# Patient Record
Sex: Male | Born: 1955 | Race: White | Hispanic: No | Smoking: Former smoker
Health system: Southern US, Community
[De-identification: ages and names within clinical notes are randomized; demographics above are authoritative.]

## PROBLEM LIST (undated history)

## (undated) DIAGNOSIS — J302 Other seasonal allergic rhinitis: Secondary | ICD-10-CM

## (undated) DIAGNOSIS — Z720 Tobacco use: Secondary | ICD-10-CM

## (undated) DIAGNOSIS — Z5189 Encounter for other specified aftercare: Secondary | ICD-10-CM

## (undated) DIAGNOSIS — C159 Malignant neoplasm of esophagus, unspecified: Secondary | ICD-10-CM

## (undated) DIAGNOSIS — Z9221 Personal history of antineoplastic chemotherapy: Secondary | ICD-10-CM

## (undated) DIAGNOSIS — Z923 Personal history of irradiation: Secondary | ICD-10-CM

## (undated) DIAGNOSIS — D72829 Elevated white blood cell count, unspecified: Secondary | ICD-10-CM

## (undated) DIAGNOSIS — K08109 Complete loss of teeth, unspecified cause, unspecified class: Secondary | ICD-10-CM

## (undated) DIAGNOSIS — K000 Anodontia: Secondary | ICD-10-CM

## (undated) DIAGNOSIS — C029 Malignant neoplasm of tongue, unspecified: Principal | ICD-10-CM

## (undated) HISTORY — DX: Other seasonal allergic rhinitis: J30.2

## (undated) HISTORY — PX: OTHER SURGICAL HISTORY: SHX169

## (undated) HISTORY — PX: TRACHEAL CLOSED STENT REMOVAL W/ MLB: SHX2550

---

## 2011-10-30 ENCOUNTER — Inpatient Hospital Stay (HOSPITAL_COMMUNITY)
Admission: EM | Admit: 2011-10-30 | Discharge: 2011-11-01 | DRG: 148 | Disposition: A | Discharge status: Home / Self Care | Payer: Medicaid Other | Attending: Internal Medicine | Admitting: Internal Medicine

## 2011-10-30 ENCOUNTER — Emergency Department (HOSPITAL_COMMUNITY): Payer: Medicaid Other

## 2011-10-30 ENCOUNTER — Encounter (HOSPITAL_COMMUNITY): Payer: Self-pay | Admitting: *Deleted

## 2011-10-30 ENCOUNTER — Emergency Department (HOSPITAL_COMMUNITY)
Admission: EM | Admit: 2011-10-30 | Discharge: 2011-10-30 | Disposition: A | Discharge status: Home / Self Care | Payer: Self-pay | Source: Home / Self Care

## 2011-10-30 DIAGNOSIS — E871 Hypo-osmolality and hyponatremia: Secondary | ICD-10-CM | POA: Diagnosis present

## 2011-10-30 DIAGNOSIS — R634 Abnormal weight loss: Secondary | ICD-10-CM

## 2011-10-30 DIAGNOSIS — R599 Enlarged lymph nodes, unspecified: Secondary | ICD-10-CM | POA: Diagnosis present

## 2011-10-30 DIAGNOSIS — K148 Other diseases of tongue: Secondary | ICD-10-CM

## 2011-10-30 DIAGNOSIS — C029 Malignant neoplasm of tongue, unspecified: Secondary | ICD-10-CM

## 2011-10-30 DIAGNOSIS — E860 Dehydration: Secondary | ICD-10-CM | POA: Diagnosis present

## 2011-10-30 DIAGNOSIS — D72829 Elevated white blood cell count, unspecified: Secondary | ICD-10-CM | POA: Diagnosis present

## 2011-10-30 DIAGNOSIS — R1311 Dysphagia, oral phase: Secondary | ICD-10-CM | POA: Diagnosis present

## 2011-10-30 DIAGNOSIS — F17200 Nicotine dependence, unspecified, uncomplicated: Secondary | ICD-10-CM | POA: Diagnosis present

## 2011-10-30 DIAGNOSIS — C023 Malignant neoplasm of anterior two-thirds of tongue, part unspecified: Principal | ICD-10-CM | POA: Diagnosis present

## 2011-10-30 HISTORY — DX: Tobacco use: Z72.0

## 2011-10-30 LAB — DIFFERENTIAL
Eosinophils Absolute: 0.2 10*3/uL (ref 0.0–0.7)
Eosinophils Relative: 1 % (ref 0–5)
Lymphs Abs: 2.8 10*3/uL (ref 0.7–4.0)
Monocytes Relative: 7 % (ref 3–12)
Neutrophils Relative %: 72 % (ref 43–77)

## 2011-10-30 LAB — HEPATIC FUNCTION PANEL
ALT: 7 U/L (ref 0–53)
AST: 17 U/L (ref 0–37)
Albumin: 3.4 g/dL — ABNORMAL LOW (ref 3.5–5.2)
Alkaline Phosphatase: 69 U/L (ref 39–117)
Bilirubin, Direct: <0.1 mg/dL (ref 0.0–0.3)
Total Bilirubin: 0.4 mg/dL (ref 0.3–1.2)
Total Protein: 7.7 g/dL (ref 6.0–8.3)

## 2011-10-30 LAB — BASIC METABOLIC PANEL
CO2: 21 mEq/L (ref 19–32)
Chloride: 95 mEq/L — ABNORMAL LOW (ref 96–112)
Glucose, Bld: 100 mg/dL — ABNORMAL HIGH (ref 70–99)
Potassium: 4.1 mEq/L (ref 3.5–5.1)
Sodium: 127 mEq/L — ABNORMAL LOW (ref 135–145)

## 2011-10-30 LAB — CBC
HCT: 47.3 % (ref 39.0–52.0)
Hemoglobin: 15.9 g/dL (ref 13.0–17.0)
MCH: 29.6 pg (ref 26.0–34.0)
MCV: 88.1 fL (ref 78.0–100.0)
RBC: 5.37 MIL/uL (ref 4.22–5.81)

## 2011-10-30 MED ORDER — IOHEXOL 300 MG/ML  SOLN
75.0000 mL | Freq: Once | INTRAMUSCULAR | Status: AC | PRN
  Administered 2011-10-30: 75 mL via INTRAVENOUS

## 2011-10-30 MED ORDER — SODIUM CHLORIDE 0.9 % IV BOLUS (SEPSIS)
1000.0000 mL | Freq: Once | INTRAVENOUS | Status: AC
  Administered 2011-10-30: 1000 mL via INTRAVENOUS

## 2011-10-30 MED ORDER — CLINDAMYCIN PHOSPHATE 900 MG/50ML IV SOLN
900.0000 mg | Freq: Once | INTRAVENOUS | Status: AC
  Administered 2011-10-30: 900 mg via INTRAVENOUS
  Filled 2011-10-30 (×2): qty 50

## 2011-10-30 NOTE — ED Notes (Signed)
Pt stated he could not void.

## 2011-10-30 NOTE — ED Notes (Signed)
Assumed pt care.

## 2011-10-30 NOTE — ED Notes (Signed)
Church members and family at the bedside for prayer.  Awaiting Orthopedic surgeon.

## 2011-10-30 NOTE — ED Notes (Signed)
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
## 2011-10-30 NOTE — ED Notes (Signed)
Pt on monitor 

## 2011-10-30 NOTE — ED Notes (Signed)
Pt is transfer from urgent care. Pt with swelling and decay to tongue, reports was here several months ago after a broken tooth and over the last couple weeks has noticed an increase in swelling and decay. Airway intact. Pt reports weight loss.

## 2011-10-30 NOTE — ED Notes (Signed)
Pt resting quietly, watching TV.  No one at the [facility] at this time.  Pt denies oral pain at this time.  There is a malodorous scent in the room.  Pt's tongue has extreme erosion on the left side along with area that appear to be necrotic.  No bleeding noted.  There is what appears to be a small area of purulent drainage to the left and bottom side of his tongue.  His speech is extremely affected as a result of the deformity and erosion to his tongue. Pt reports that he had a tooth that had abcesed and he injured the tip of his tongue on the jagged edge some time ago.  He has not sought medical advice or  Treatment prior to today.

## 2011-10-31 ENCOUNTER — Encounter (HOSPITAL_COMMUNITY): Payer: Self-pay | Admitting: Internal Medicine

## 2011-10-31 DIAGNOSIS — E860 Dehydration: Secondary | ICD-10-CM | POA: Diagnosis present

## 2011-10-31 DIAGNOSIS — E871 Hypo-osmolality and hyponatremia: Secondary | ICD-10-CM | POA: Diagnosis present

## 2011-10-31 DIAGNOSIS — C029 Malignant neoplasm of tongue, unspecified: Secondary | ICD-10-CM | POA: Diagnosis present

## 2011-10-31 DIAGNOSIS — D72829 Elevated white blood cell count, unspecified: Secondary | ICD-10-CM | POA: Diagnosis present

## 2011-10-31 LAB — URINALYSIS, ROUTINE W REFLEX MICROSCOPIC
Glucose, UA: NEGATIVE mg/dL
Hgb urine dipstick: NEGATIVE
Protein, ur: NEGATIVE mg/dL

## 2011-10-31 LAB — MAGNESIUM: Magnesium: 1.8 mg/dL (ref 1.5–2.5)

## 2011-10-31 LAB — CBC
HCT: 45 % (ref 39.0–52.0)
Hemoglobin: 15.1 g/dL (ref 13.0–17.0)
MCH: 29.7 pg (ref 26.0–34.0)
MCHC: 33.6 g/dL (ref 30.0–36.0)

## 2011-10-31 LAB — COMPREHENSIVE METABOLIC PANEL
ALT: 5 U/L (ref 0–53)
AST: 13 U/L (ref 0–37)
CO2: 23 mEq/L (ref 19–32)
Calcium: 9.4 mg/dL (ref 8.4–10.5)
Sodium: 133 mEq/L — ABNORMAL LOW (ref 135–145)
Total Protein: 6.5 g/dL (ref 6.0–8.3)

## 2011-10-31 LAB — TSH: TSH: 2.271 u[IU]/mL (ref 0.350–4.500)

## 2011-10-31 MED ORDER — ONDANSETRON HCL 4 MG PO TABS: 4.0000 mg | ORAL_TABLET | Freq: Four times a day (QID) | ORAL | Status: DC | PRN

## 2011-10-31 MED ORDER — ENOXAPARIN SODIUM 40 MG/0.4ML ~~LOC~~ SOLN
40.0000 mg | SUBCUTANEOUS | Status: DC
  Administered 2011-10-31: 40 mg via SUBCUTANEOUS
  Filled 2011-10-31 (×2): qty 0.4

## 2011-10-31 MED ORDER — NICOTINE 14 MG/24HR TD PT24
14.0000 mg | MEDICATED_PATCH | Freq: Every day | TRANSDERMAL | Status: DC
  Administered 2011-10-31: 14 mg via TRANSDERMAL
  Filled 2011-10-31 (×2): qty 1

## 2011-10-31 MED ORDER — THIAMINE HCL 100 MG/ML IJ SOLN
Freq: Once | INTRAVENOUS | Status: AC
  Administered 2011-10-31: 06:00:00 via INTRAVENOUS
  Filled 2011-10-31 (×3): qty 1000

## 2011-10-31 MED ORDER — ACETAMINOPHEN 325 MG PO TABS: 650.0000 mg | ORAL_TABLET | Freq: Four times a day (QID) | ORAL | Status: DC | PRN

## 2011-10-31 MED ORDER — SODIUM CHLORIDE 0.9 % IV SOLN
Freq: Once | INTRAVENOUS | Status: AC
  Administered 2011-10-31: 01:00:00 via INTRAVENOUS

## 2011-10-31 MED ORDER — ONDANSETRON HCL 4 MG/2ML IJ SOLN: 4.0000 mg | Freq: Four times a day (QID) | INTRAMUSCULAR | Status: DC | PRN

## 2011-10-31 MED ORDER — SODIUM CHLORIDE 0.9 % IV SOLN
INTRAVENOUS | Status: AC
  Administered 2011-10-31: 03:00:00 via INTRAVENOUS

## 2011-10-31 MED ORDER — SODIUM CHLORIDE 0.9 % IV SOLN
INTRAVENOUS | Status: DC
  Administered 2011-10-31: 1000 mL via INTRAVENOUS

## 2011-10-31 MED ORDER — GUAIFENESIN-DM 100-10 MG/5ML PO SYRP
5.0000 mL | ORAL_SOLUTION | ORAL | Status: DC | PRN
  Filled 2011-10-31: qty 5

## 2011-10-31 MED ORDER — ALBUTEROL SULFATE (5 MG/ML) 0.5% IN NEBU: 2.5000 mg | INHALATION_SOLUTION | RESPIRATORY_TRACT | Status: DC | PRN

## 2011-10-31 MED ORDER — ENSURE IMMUNE HEALTH PO LIQD: 237.0000 mL | Freq: Three times a day (TID) | ORAL | Status: DC

## 2011-10-31 MED ORDER — HYDROCODONE-ACETAMINOPHEN 5-325 MG PO TABS: 1.0000 | ORAL_TABLET | ORAL | Status: DC | PRN

## 2011-10-31 MED ORDER — ACETAMINOPHEN 650 MG RE SUPP: 650.0000 mg | Freq: Four times a day (QID) | RECTAL | Status: DC | PRN

## 2011-10-31 MED ORDER — ALUM & MAG HYDROXIDE-SIMETH 200-200-20 MG/5ML PO SUSP: 30.0000 mL | Freq: Four times a day (QID) | ORAL | Status: DC | PRN

## 2011-10-31 NOTE — ED Notes (Signed)
Report given to [facility], pt to go to CDU 4

## 2011-10-31 NOTE — Progress Notes (Signed)
   CARE MANAGEMENT NOTE 10/31/2011  Patient:  Henry Leonard, Henry Leonard   Account Number:  [id number]  Date Initiated:  10/31/2011  Documentation initiated by:  Letha Cape  Subjective/Objective Assessment:   dx tongue cancer  admit- lives alone- rents a room out to a friend. pta independent.     Action/Plan:   Anticipated DC Date:  11/03/2011   Anticipated DC Plan:  HOME/SELF CARE      DC Planning Services  CM consult      Choice offered to / List presented to:             Status of service:  In process, will continue to follow Medicare Important Message given?   (If response is "NO", the following Medicare IM given date fields will be blank) Date Medicare IM given:   Date Additional Medicare IM given:    Discharge Disposition:    Per UR Regulation:    If discussed at Long Length of Stay Meetings, dates discussed:    Comments:  10/31/11 15:02 Letha Cape RN, BSN [phone] patient lives alone and he rents a room out to a friend. Patient states he is not on any medications really and if he is put on any meds at dc he can afford generics at [facility] for $4.  Patient states he has transportation at dc.

## 2011-10-31 NOTE — ED Notes (Addendum)
MD at bedside. Dr. Adela Glimpse

## 2011-10-31 NOTE — Progress Notes (Signed)
PATIENT DETAILS Name: Henry Leonard Age: 56 y.o. Sex: male Date of Birth: 26-Jan-1956 Admit Date: 10/30/2011 PCP:No primary provider on file.  Subjective: No major issues overnight  Objective: Vital signs in last 24 hours: Filed Vitals:   10/30/11 2215 10/31/11 0044 10/31/11 0301 10/31/11 0458  BP: 121/83 136/86 109/74 120/79  Pulse: 98 85 85 72  Temp:   98.4 F (36.9 C) 98.2 F (36.8 C)  TempSrc:   Oral Oral  Resp: 21 20 20 20   Height:   5\' 6"  (1.676 m)   Weight:   59.7 kg (131 lb 9.8 oz)   SpO2: 97% 93% 94% 97%    Weight change:   Body mass index is 21.24 kg/(m^2).  Intake/Output from previous day:  Intake/Output Summary (Last 24 hours) at 10/31/11 1052 Last data filed at 10/31/11 0458  Gross per 24 hour  Intake      0 ml  Output    400 ml  Net   -400 ml    PHYSICAL EXAM: Gen Exam: Awake and alert with clear speech. Mild trismus Neck: Supple, No JVD.  +Palpable cervical lymphadenopathy Chest: B/L Clear.   CVS: S1 S2 Regular, no murmurs.  Abdomen: soft, BS +, non tender, non distended.  Extremities: no edema, lower extremities warm to touch. Neurologic: Non Focal.   Skin: No Rash.   Wounds: N/A.    CONSULTS:  None  LAB RESULTS: CBC  Lab 10/31/11 0500 10/30/11 1726  WBC 11.9* 14.9*  HGB 15.1 15.9  HCT 45.0 47.3  PLT 390 437*  MCV 88.6 88.1  MCH 29.7 29.6  MCHC 33.6 33.6  RDW 14.9 15.0  LYMPHSABS -- 2.8  MONOABS -- 1.1*  EOSABS -- 0.2  BASOSABS -- 0.1  BANDABS -- --    Chemistries   Lab 10/31/11 0500 10/30/11 1726  NA 133* 127*  K 3.9 4.1  CL 101 95*  CO2 23 21  GLUCOSE 87 100*  BUN 5* 5*  CREATININE 0.48* 0.45*  CALCIUM 9.4 9.9  MG 1.8 --    GFR Estimated Creatinine Clearance: 90.2 ml/min (by C-G formula based on Cr of 0.48).  Coagulation profile No results found for this basename: INR:5,PROTIME:5 in the last 168 hours  Cardiac Enzymes No results found for this basename: CK:3,CKMB:3,TROPONINI:3,MYOGLOBIN:3 in the last 168  hours  No components found with this basename: POCBNP:3 No results found for this basename: DDIMER:2 in the last 72 hours No results found for this basename: HGBA1C:2 in the last 72 hours No results found for this basename: CHOL:2,HDL:2,LDLCALC:2,TRIG:2,CHOLHDL:2,LDLDIRECT:2 in the last 72 hours  Basename 10/31/11 0500  TSH 2.271  T4TOTAL --  T3FREE --  THYROIDAB --   No results found for this basename: VITAMINB12:2,FOLATE:2,FERRITIN:2,TIBC:2,IRON:2,RETICCTPCT:2 in the last 72 hours No results found for this basename: LIPASE:2,AMYLASE:2 in the last 72 hours  Urine Studies No results found for this basename: UACOL:2,UAPR:2,USPG:2,UPH:2,UTP:2,UGL:2,UKET:2,UBIL:2,UHGB:2,UNIT:2,UROB:2,ULEU:2,UEPI:2,UWBC:2,URBC:2,UBAC:2,CAST:2,CRYS:2,UCOM:2,BILUA:2 in the last 72 hours  MICROBIOLOGY: No results found for this or any previous visit (from the past 240 hour(s)).  RADIOLOGY STUDIES/RESULTS: Ct Soft Tissue Neck W Contrast  10/30/2011  *RADIOLOGY REPORT*  Clinical Data: Tongue swelling  CT NECK WITH CONTRAST  Technique:  Multidetector CT imaging of the neck was performed with intravenous contrast.  Contrast: 75mL OMNIPAQUE IOHEXOL 300 MG/ML IJ SOLN  Comparison: None.  Findings: At the palpable abnormality, below the right side of the mandible, there is a 813 mm short axis diameter node adjacent to the submandibular salivary gland.  Abnormal left level II cervical adenopathy  is present.  15 mm short axis diameter node on image 44. 70 mm short axis diameter node on 14 none.  There is low density within this nodal mass worrisome for necrosis.  There is no evidence of palatine tonsil abscess or adenoidal tonsillar abscess. Lingual tonsillar tissue is unremarkable.  Mucosal thickening within the maxillary sinuses.  Mucous retention cyst in the right maxillary sinus.  Small amount of free fluid in the right mastoid air cells.  Extensive caries and periodontal disease is noted.  There is peri apical lucency  surrounding several lower teeth.  Left lower posterior teeth are eroded secondary to caries.  Jugular veins are patent.  Common carotid arteries are patent. Atherosclerotic calcific disease involving the origin of both internal carotid arteries.  The tongue has an abnormal appearance.  The anterior aspect of the left side of the tongue is absent.  Findings are worrisome for a infiltrating mass within the remainder of the tongue.  The mass is 2.6 x 5.5 cm on image 40 within the anterior tongue.  IMPRESSION: Abnormal cervical adenopathy associated with an abnormal soft tissue.  There may be a soft tissue infiltrating mass within the tongue worrisome for squamous cell carcinoma and metastatic adenopathy.  Dental caries and periodontal disease.  Original Report Authenticated By: Donavan Burnet, M.D.    MEDICATIONS: Scheduled Meds:   . sodium chloride   Intravenous Once  . clindamycin (CLEOCIN) IV  900 mg Intravenous Once  . enoxaparin  40 mg Subcutaneous Q24H  . nicotine  14 mg Transdermal Daily  . general admission iv infusion   Intravenous Once  . sodium chloride  1,000 mL Intravenous Once   Continuous Infusions:   . sodium chloride 600 mL (10/31/11 0736)   PRN Meds:.acetaminophen, acetaminophen, albuterol, alum & mag hydroxide-simeth, guaiFENesin-dextromethorphan, HYDROcodone-acetaminophen, iohexol, ondansetron (ZOFRAN) IV, ondansetron  Antibiotics: Anti-infectives     Start     Dose/Rate Route Frequency Ordered Stop   10/30/11 1945   clindamycin (CLEOCIN) IVPB 900 mg        900 mg 100 mL/hr over 30 Minutes Intravenous  Once 10/30/11 1934 10/30/11 2115          Assessment/Plan: Patient Active Hospital Problem List: Tongue cancer  -Have already consulted ENT-spoke with Dr Jearld Fenton -will await ENT eval for further recommendations  Dehydration -clinically euvolemic -stop IVF, recheck Lytes in am  Hyponatremia  -likely secondary to above -recheck lytes after IVF hydration     Leucocytosis  -likely secondary to inflammation/necrosis from malignancy -no foci of infection evident, will monitor for now  Disposition: Remain inpatient  DVT Prophylaxis: Hold of starting lovenox-till biopsy done-start SCD's  Code Status: Full Code  Maretta Bees, fifth  MD. 10/31/2011, 10:52 AM

## 2011-10-31 NOTE — ED Provider Notes (Signed)
History     CSN: [id number]  Arrival date & time 10/30/11  1531   First MD Initiated Contact with Patient 10/30/11 1825      Chief Complaint  Patient presents with  . Oral Swelling    (Consider location/radiation/quality/duration/timing/severity/associated sxs/prior treatment) The history is provided by the patient.  The patient presents to the ER from the [facility] with tongue swelling and drainage. The patient states that he developed tongue irritation about 10 months ago and over the last 3 months experienced a 45 pound weight loss. The patient states that he has still been able to eat a drink. He denies fevers, N/V, chest pain or sob. The patient is a 1 ppd smoker. He denies any smokeless tobacco.   Past Medical History  Diagnosis Date  . Tobacco abuse     History reviewed. No pertinent past surgical history.  Family History  Problem Relation Age of Onset  . Esophageal cancer Father     History  Substance Use Topics  . Smoking status: Current Everyday Smoker -- 35 years  . Smokeless tobacco: Not on file  . Alcohol Use: Yes     2 beers a day      Review of Systems All pertinent positives and negatives reviewed in the history of present illness  Allergies  Penicillins  Home Medications   Current Outpatient Rx  Name Route Sig Dispense Refill  . ACETAMINOPHEN 500 MG PO TABS Oral Take 1,000 mg by mouth every 6 (six) hours as needed. For pain      BP 136/86  Pulse 85  Temp(Src) 97.8 F (36.6 C) (Axillary)  Resp 20  SpO2 93%  Physical Exam  Constitutional: He is oriented to person, place, and time. He appears well-developed and well-nourished. No distress.  HENT:  Head: Normocephalic and atraumatic.       The patients tongue has a significant deformity to the left aspect and there is escar like material to the tongue. The tongue has significant damage to the tissue and has an appearance that this is an invasive process.   Eyes: Pupils are equal, round, and  reactive to light.  Neck: Normal range of motion.       Patient has a large submandibular node noted on the R. There is some small supraclavicular nodes noted bilaterally.  Cardiovascular: Normal rate and regular rhythm.  Exam reveals no gallop and no friction rub.   No murmur heard. Pulmonary/Chest: Effort normal and breath sounds normal. No respiratory distress.  Abdominal: Soft. Bowel sounds are normal. He exhibits no distension. There is no tenderness.  Neurological: He is alert and oriented to person, place, and time.  Skin: Skin is warm and dry. No rash noted.    ED Course  Procedures (including critical care time)  Labs Reviewed  BASIC METABOLIC PANEL - Abnormal; Notable for the following:    Sodium 127 (*)    Chloride 95 (*)    Glucose, Bld 100 (*)    BUN 5 (*)    Creatinine, Ser 0.45 (*)    All other components within normal limits  CBC - Abnormal; Notable for the following:    WBC 14.9 (*)    Platelets 437 (*)    All other components within normal limits  DIFFERENTIAL - Abnormal; Notable for the following:    Neutro Abs 10.8 (*)    Monocytes Absolute 1.1 (*)    All other components within normal limits  HEPATIC FUNCTION PANEL - Abnormal; Notable for the  following:    Albumin 3.4 (*)    All other components within normal limits  URINALYSIS, ROUTINE W REFLEX MICROSCOPIC   Ct Soft Tissue Neck W Contrast  10/30/2011  *RADIOLOGY REPORT*  Clinical Data: Tongue swelling  CT NECK WITH CONTRAST  Technique:  Multidetector CT imaging of the neck was performed with intravenous contrast.  Contrast: 75mL OMNIPAQUE IOHEXOL 300 MG/ML IJ SOLN  Comparison: None.  Findings: At the palpable abnormality, below the right side of the mandible, there is a 813 mm short axis diameter node adjacent to the submandibular salivary gland.  Abnormal left level II cervical adenopathy is present.  15 mm short axis diameter node on image 44. 70 mm short axis diameter node on 14 none.  There is low density  within this nodal mass worrisome for necrosis.  There is no evidence of palatine tonsil abscess or adenoidal tonsillar abscess. Lingual tonsillar tissue is unremarkable.  Mucosal thickening within the maxillary sinuses.  Mucous retention cyst in the right maxillary sinus.  Small amount of free fluid in the right mastoid air cells.  Extensive caries and periodontal disease is noted.  There is peri apical lucency surrounding several lower teeth.  Left lower posterior teeth are eroded secondary to caries.  Jugular veins are patent.  Common carotid arteries are patent. Atherosclerotic calcific disease involving the origin of both internal carotid arteries.  The tongue has an abnormal appearance.  The anterior aspect of the left side of the tongue is absent.  Findings are worrisome for a infiltrating mass within the remainder of the tongue.  The mass is 2.6 x 5.5 cm on image 40 within the anterior tongue.  IMPRESSION: Abnormal cervical adenopathy associated with an abnormal soft tissue.  There may be a soft tissue infiltrating mass within the tongue worrisome for squamous cell carcinoma and metastatic adenopathy.  Dental caries and periodontal disease.  Original Report Authenticated By: Donavan Burnet, M.D.     1. Squamous cell cancer of tongue     The patient will be admitted for what appears to be an invasive form of cancer of the tongue. The patient is stable here in the ER.  MDM  MDM Reviewed: nursing note and vitals Interpretation: CT scan and labs Consults: admitting MD            Carlyle Dolly, PA-C 11/02/11 1245

## 2011-10-31 NOTE — Progress Notes (Signed)
Speech Language/Pathology Clinical/Bedside Swallow Evaluation Patient Details  Name: Henry Leonard MRN: [id number] DOB: Feb 05, 1956 Today's Date: 10/31/2011  Past Medical History:  Past Medical History  Diagnosis Date  . Tobacco abuse    Past Surgical History: History reviewed. No pertinent past surgical history. HPI:  Pt is a 56 year old male admitted with severe tongue ulceration likely due to cancer with resulting hyponatremia, 40 lbs weight loss, dehydration, slurred speech.  Pt also with visible mass on bilateral upper neck below posterior mandbile. Pt reports it started when he had a chipped tooth that cut his tongue but he has not seen a medical professional in regards to this problem. He reports he has not had any difficulty drinking liquids or choking, but he does have significant difficulty with oral minipulation of solids. He also has difficulty managing his secretions. A referral to ENT has been made.    Assessment/Recommendations/Treatment Plan    SLP Assessment Clinical Impression Statement: Pt presents with severe oral dysphagia secondary to severe ulceration of lingual tissue. Pt with limited lateralization (especially to left) and elevation/depression of tongue tip. This limited mobility impairs pts ability to manipulate puree or mechanical soft boluses for mastication or transit. Diffuse residual remains beneath tongue and in lateral sulci despite pts compensatory strategies (head tilt to right). Pt does appear to make contact with base of tongue to pharyngeal wall for bolus containment and pharyngeal propulsion of thin bolus. With use of straws the pt is able to functionally swallow thin liquids without overt s/s of aspiration. Would recommend pt remain on thin liquid diet though would suggest adding Ensure or other nutritious liquids to diet. Consider referal to clinical dietitian. SLP will continue to follow for needs as pt receives treatment as function may fluctuate and f/u  services are needed.  Risk for Aspiration: Mild  Swallow Evaluation Recommendations Recommended Consults: Consider ENT evaluation Liquid Consistency: Thin Liquid Administration via: Straw Medication Administration: Whole meds with liquid Supervision: Patient able to self feed Postural Changes and/or Swallow Maneuvers: Upright 30-60 min after meal Oral Care Recommendations: Oral care QID (if tolerated by pt. ) Other Recommendations: Have oral suction available Follow up Recommendations: Outpatient SLP  Treatment Plan Treatment Plan Recommendations: Therapy as outlined in treatment plan below Speech Therapy Frequency: min 1 x/week Treatment Duration: 2 weeks Interventions: Aspiration precaution training;Compensatory techniques;Patient/family education;Diet toleration management by SLP  Prognosis Prognosis for Safe Diet Advancement: Guarded Barriers to Reach Goals: Severity of dysphagia  Individuals Consulted Consulted and Agree with Results and Recommendations: Patient  Swallowing Goals  SLP Swallowing Goals Patient will consume recommended diet without observed clinical signs of aspiration with: Minimal assistance Patient will utilize recommended strategies during swallow to increase swallowing safety with: Minimal cueing   Wallie Lagrand, Riley Nearing 10/31/2011,11:17 AM

## 2011-10-31 NOTE — H&P (Signed)
PCP:  none   Chief Complaint:   Cannot eat well  HPI: Henry Leonard is a 56 y.o. male   has a past medical history of Tobacco abuse.   Presented with  1 month of tongue swelling and ulceration trouble speaking, swelling on both sides of his neck, 40 lb weight loss since December. He have not seen a dentist since 1990's. Have no PCP and could not check this out previously.  Review of Systems:    Pertinent positives include:weight loss,  slurred speech,   Constitutional:  No weight loss, night sweats, Fevers, chills, fatigue,  HEENT:  No headaches, Difficulty swallowing,Tooth/dental problems,Sore throat,  No sneezing, itching, ear ache, nasal congestion, post nasal drip,  Cardio-vascular:  No chest pain, Orthopnea, PND, anasarca, dizziness, palpitations.no Bilateral lower extremity swelling  GI:  No heartburn, indigestion, abdominal pain, nausea, vomiting, diarrhea, change in bowel habits, loss of appetite, melena, blood in stool, hematoemesis Resp:  no shortness of breath at rest. No dyspnea on exertion, No excess mucus, no productive cough, No non-productive cough, No coughing up of blood.No change in color of mucus.No wheezing. Skin:  no rash or lesions. No jaundice GU:  no dysuria, change in color of urine, no urgency or frequency. No straining to urinate.  No flank pain.  Musculoskeletal:  No joint pain or no joint swelling. No decreased range of motion. No back pain.  Psych:  No change in mood or affect. No depression or anxiety. No memory loss.  Neuro: no localizing neurological complaints, no tingling, no weakness, no double vision, no gait abnormality, no no confusion  Otherwise ROS are negative except for above, 10 systems were reviewed  Past Medical History: Past Medical History  Diagnosis Date  . Tobacco abuse    History reviewed. No pertinent past surgical history.   Medications: Prior to Admission medications   Medication Sig Start Date End Date  Taking? Authorizing Provider  acetaminophen (TYLENOL) 500 MG tablet Take 1,000 mg by mouth every 6 (six) hours as needed. For pain   Yes Historical Provider, MD    Allergies:   Allergies  Allergen Reactions  . Penicillins Rash    Social History:  Ambulatory  independently  Lives at home alone, no local family   reports that he has been smoking.  He does not have any smokeless tobacco history on file. He reports that he drinks alcohol. He reports that he does not use illicit drugs.   Family History: family history includes Esophageal cancer in his father.    Physical Exam: Patient Vitals for the past 24 hrs:  BP Temp Temp src Pulse Resp SpO2  10/30/11 2215 121/83 mmHg - - 98  21  97 %  10/30/11 2203 131/88 mmHg 97.8 F (36.6 C) Axillary 92  20  96 %  10/30/11 2115 117/87 mmHg - - 94  22  95 %  10/30/11 2015 118/81 mmHg - - 94  20  96 %  10/30/11 1915 133/103 mmHg - - 109  15  97 %  10/30/11 1555 138/77 mmHg 99 F (37.2 C) - 130  - 99 %    1. General:  in No Acute distress 2. Psychological: Alert and  Oriented 3. Head/ENT:   Moist Mucous Membranes                          Head Non traumatic, neck supple  Poor Dentition Large necrosing mass on the left of the tongue Extensive submandibular lymphadenopathy 4. SKIN:  decreased Skin turgor,  Skin clean Dry and intact no rash 5. Heart: Regular rate and rhythm no Murmur, Rub or gallop 6. Lungs: Clear to auscultation bilaterally, no wheezes or crackles   7. Abdomen: Soft, non-tender, Non distended, possible hepatomegaly? 8. Lower extremities: no clubbing, cyanosis, or edema 9. Neurologically [place] 2-12 intact strength 5/5 10. MSK: Normal range of motion  body mass index is unknown because there is no height or weight on file.   Labs on Admission:   Basename 10/30/11 1726  NA 127*  K 4.1  CL 95*  CO2 21  GLUCOSE 100*  BUN 5*  CREATININE 0.45*  CALCIUM 9.9  MG --  PHOS --    Basename  10/30/11 1726  AST 17  ALT 7  ALKPHOS 69  BILITOT 0.4  PROT 7.7  ALBUMIN 3.4*   No results found for this basename: LIPASE:2,AMYLASE:2 in the last 72 hours  Basename 10/30/11 1726  WBC 14.9*  NEUTROABS 10.8*  HGB 15.9  HCT 47.3  MCV 88.1  PLT 437*   No results found for this basename: CKTOTAL:3,CKMB:3,CKMBINDEX:3,TROPONINI:3 in the last 72 hours No results found for this basename: TSH,T4TOTAL,FREET3,T3FREE,THYROIDAB in the last 72 hours No results found for this basename: VITAMINB12:2,FOLATE:2,FERRITIN:2,TIBC:2,IRON:2,RETICCTPCT:2 in the last 72 hours No results found for this basename: HGBA1C    CrCl is unknown because there is no height on file for the current visit. ABG No results found for this basename: phart, pco2, po2, hco3, tco2, acidbasedef, o2sat     No results found for this basename: DDIMER      Cultures: No results found for this basename: sdes, specrequest, cult, reptstatus       Radiological Exams on Admission: Ct Soft Tissue Neck W Contrast  10/30/2011  *RADIOLOGY REPORT*  Clinical Data: Tongue swelling  CT NECK WITH CONTRAST  Technique:  Multidetector CT imaging of the neck was performed with intravenous contrast.  Contrast: 75mL OMNIPAQUE IOHEXOL 300 MG/ML IJ SOLN  Comparison: None.  Findings: At the palpable abnormality, below the right side of the mandible, there is a 813 mm short axis diameter node adjacent to the submandibular salivary gland.  Abnormal left level II cervical adenopathy is present.  15 mm short axis diameter node on image 44. 70 mm short axis diameter node on 14 none.  There is low density within this nodal mass worrisome for necrosis.  There is no evidence of palatine tonsil abscess or adenoidal tonsillar abscess. Lingual tonsillar tissue is unremarkable.  Mucosal thickening within the maxillary sinuses.  Mucous retention cyst in the right maxillary sinus.  Small amount of free fluid in the right mastoid air cells.  Extensive caries and  periodontal disease is noted.  There is peri apical lucency surrounding several lower teeth.  Left lower posterior teeth are eroded secondary to caries.  Jugular veins are patent.  Common carotid arteries are patent. Atherosclerotic calcific disease involving the origin of both internal carotid arteries.  The tongue has an abnormal appearance.  The anterior aspect of the left side of the tongue is absent.  Findings are worrisome for a infiltrating mass within the remainder of the tongue.  The mass is 2.6 x 5.5 cm on image 40 within the anterior tongue.  IMPRESSION: Abnormal cervical adenopathy associated with an abnormal soft tissue.  There may be a soft tissue infiltrating mass within the tongue worrisome for squamous cell carcinoma and metastatic  adenopathy.  Dental caries and periodontal disease.  Original Report Authenticated By: Donavan Burnet, M.D.    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 Monitor Na levels to avoid over aggressive correction. Check TSH. Stop offending medications. If no improvement with IVF will initiate further work up for SIADH if appropriate especially given diagnosis of head and neck  cancer .Leucocytosis - possibly reactive   Prophylaxis:  Lovenox  CODE STATUS: FULL CODE  I have spent a total of on this admition  Curly Mackowski 10/31/2011, 12:31 AM

## 2011-10-31 NOTE — ED Notes (Signed)
Patient denies pain and is resting comfortably.  

## 2011-10-31 NOTE — Progress Notes (Signed)
Clinical Social Worker reviewed Radio producer with pt.  CSW provided opportunity for pt to process questions/concern related to Advanced Directives.  CSW encouraged pt to review information and contact CSW when ready to notarize.  CSW signing off at this time.  Please re consult if needed.  Angelia Mould, MSW, LCSWA (For Jacklynn Lewis, MSW, [place])

## 2011-10-31 NOTE — Consult Note (Addendum)
Assess the patient for mass in his neck. 56 year old who apparently has had a mass in his tongue and neck for 10 months. He does not have medical insurance and is not had any care for this. He states it started after a tooth problem. He is still able to take fluids. He was seen in urgent care yesterday and admitted for medical therapy. He has had a CT scan of his neck. He has not had a biopsy. He does have a history of drinking as well as smoking. He has pain in the left dental alveolar ridge region but really his tongue does not hurt. He still has some mobility of the tongue but his voice is definitely negatively affected. It is dysarthria but he has no hoarseness. He noted the nodes in his neck came up at least 6 months ago. Examination-awake and alert, he has a very hot potato sounding voice. Nose-no evidence of any lesions in minimal congestion. Oral cavity/oropharynx-he has extremely poor dentition, he has a very large exophytic mass that encompasses the entire left anterior tongue and the tumor extends just about exactly 2 midline. There is a very slight extension into the lingual sulcus floor of mouth but the margins are fairly well-defined with this tumor. The base of tongue looks normal and the remaining soft palate uvula and posterior pharyngeal wall look normal. The tongue still has mobility. Neck-he has mobile nodes in the right submandibular left submandibular left jugular digastric and left level III. There is no tenderness or swelling. Squamous cell carcinoma of left tongue-I will review the CT scan and he will need a biopsy of the tongue although there is really no doubt this is a cancer. I briefly discussed with him that usually mobile tongue cancers are  treated with surgery but in his case he does not have much of any interest in a surgical option and if he did most of the anterior tongue would need to be resected. He also would need bilateral neck dissections. I will perform the biopsy  tomorrow and then attempt to get him an appointment with radiation oncology to allow them to discuss his options as well. CT scan-there is nodes on both sides of the neck mostly as described by the physical exam. They are concerning for metastatic disease. He has what appears to be a infiltrating mass in the tongue that encompasses most of the anterior tongue and extends to the level anterior to the circumvallate papillae on the left.

## 2011-10-31 NOTE — Progress Notes (Signed)
Utilization review completed.  

## 2011-11-01 DIAGNOSIS — D72829 Elevated white blood cell count, unspecified: Secondary | ICD-10-CM

## 2011-11-01 DIAGNOSIS — C029 Malignant neoplasm of tongue, unspecified: Secondary | ICD-10-CM | POA: Insufficient documentation

## 2011-11-01 HISTORY — DX: Malignant neoplasm of tongue, unspecified: C02.9

## 2011-11-01 HISTORY — DX: Elevated white blood cell count, unspecified: D72.829

## 2011-11-01 LAB — BASIC METABOLIC PANEL
BUN: 3 mg/dL — ABNORMAL LOW (ref 6–23)
Calcium: 9.2 mg/dL (ref 8.4–10.5)
GFR calc non Af Amer: >90 mL/min (ref >90)
Glucose, Bld: 103 mg/dL — ABNORMAL HIGH (ref 70–99)

## 2011-11-01 LAB — CBC
HCT: 48.2 % (ref 39.0–52.0)
Hemoglobin: 15.6 g/dL (ref 13.0–17.0)
MCH: 29.1 pg (ref 26.0–34.0)
MCHC: 32.4 g/dL (ref 30.0–36.0)

## 2011-11-01 MED ORDER — HYDRALAZINE HCL 20 MG/ML IJ SOLN
5.0000 mg | Freq: Once | INTRAMUSCULAR | Status: AC
  Administered 2011-11-01: 5 mg via INTRAVENOUS
  Filled 2011-11-01: qty 0.25

## 2011-11-01 MED ORDER — EPINEPHRINE HCL (NASAL) 0.1 % NA SOLN
1.0000 [drp] | Freq: Once | NASAL | Status: DC
  Filled 2011-11-01: qty 30

## 2011-11-01 MED ORDER — NICOTINE 14 MG/24HR TD PT24: 1.0000 | MEDICATED_PATCH | TRANSDERMAL | Status: AC

## 2011-11-01 MED ORDER — SILVER NITRATE-POT NITRATE 75-25 % EX MISC
2.0000 | Freq: Once | CUTANEOUS | Status: DC
  Filled 2011-11-01: qty 2

## 2011-11-01 MED ORDER — ENSURE PO LIQD: 237.0000 mL | Freq: Three times a day (TID) | ORAL | Status: AC

## 2011-11-01 NOTE — Progress Notes (Signed)
Henry Leonard discharged Home per MD order.  Discharge instructions reviewed and discussed with the patient, all questions and concerns answered. Copy of instructions and scripts given to patient.   [name], [name]  Home Medication Instructions EAV:[id number]   Printed on:11/01/11 1120  Medication Information                    ENSURE (ENSURE) Take 237 mLs by mouth 3 (three) times daily between meals.           nicotine (NICODERM CQ) 14 mg/24hr patch Place 1 patch onto the skin daily.             Patients skin is clean, dry and intact, no evidence of skin break down. Pt to f/u with Dr. Merceda Elks for tongue treatment. IV site discontinued and catheter remains intact. Site without signs and symptoms of complications. Dressing and pressure applied.  Patient escorted to car by NT in a wheelchair,  no distress noted upon discharge.  Julien Nordmann [facility] 11/01/2011 11:20 AM

## 2011-11-01 NOTE — Discharge Summary (Signed)
PATIENT DETAILS Name: Henry Leonard Age: 56 y.o. Sex: male Date of Birth: October 19, 1955 MRN: [id number]. Admit Date: 10/30/2011 Admitting Physician: Therisa Doyne, MD PCP:No primary provider on file.  PRIMARY DISCHARGE DIAGNOSIS:  Principal Problem:  *Tongue cancer Active Problems:  Dehydration  Hyponatremia  Leucocytosis      PAST MEDICAL HISTORY: Past Medical History  Diagnosis Date  . Tobacco abuse     DISCHARGE MEDICATIONS: Medication List  As of 11/01/2011  9:31 AM   STOP taking these medications         acetaminophen 500 MG tablet         TAKE these medications         ENSURE   Take 237 mLs by mouth 3 (three) times daily between meals.      nicotine 14 mg/24hr patch   Commonly known as: NICODERM CQ - dosed in mg/24 hours   Place 1 patch onto the skin daily.             BRIEF HPI:  See H&P, Labs, Consult and Test reports for all details in brief, month of tongue swelling and ulceration trouble speaking, swelling on both sides of his neck, 40 lb weight loss since last 10 months!  CONSULTATIONS:  ENT  PERTINENT RADIOLOGIC STUDIES: Ct Soft Tissue Neck W Contrast  10/30/2011  *RADIOLOGY REPORT*  Clinical Data: Tongue swelling  CT NECK WITH CONTRAST  Technique:  Multidetector CT imaging of the neck was performed with intravenous contrast.  Contrast: 75mL OMNIPAQUE IOHEXOL 300 MG/ML IJ SOLN  Comparison: None.  Findings: At the palpable abnormality, below the right side of the mandible, there is a 813 mm short axis diameter node adjacent to the submandibular salivary gland.  Abnormal left level II cervical adenopathy is present.  15 mm short axis diameter node on image 44. 70 mm short axis diameter node on 14 none.  There is low density within this nodal mass worrisome for necrosis.  There is no evidence of palatine tonsil abscess or adenoidal tonsillar abscess. Lingual tonsillar tissue is unremarkable.  Mucosal thickening within the maxillary sinuses.  Mucous  retention cyst in the right maxillary sinus.  Small amount of free fluid in the right mastoid air cells.  Extensive caries and periodontal disease is noted.  There is peri apical lucency surrounding several lower teeth.  Left lower posterior teeth are eroded secondary to caries.  Jugular veins are patent.  Common carotid arteries are patent. Atherosclerotic calcific disease involving the origin of both internal carotid arteries.  The tongue has an abnormal appearance.  The anterior aspect of the left side of the tongue is absent.  Findings are worrisome for a infiltrating mass within the remainder of the tongue.  The mass is 2.6 x 5.5 cm on image 40 within the anterior tongue.  IMPRESSION: Abnormal cervical adenopathy associated with an abnormal soft tissue.  There may be a soft tissue infiltrating mass within the tongue worrisome for squamous cell carcinoma and metastatic adenopathy.  Dental caries and periodontal disease.  Original Report Authenticated By: Donavan Burnet, M.D.     PERTINENT LAB RESULTS: CBC:  Basename 11/01/11 0700 10/31/11 0500  WBC 9.7 11.9*  HGB 15.6 15.1  HCT 48.2 45.0  PLT 390 390   CMET CMP     Component Value Date/Time   NA 134* 11/01/2011 0700   K 3.6 11/01/2011 0700   CL 101 11/01/2011 0700   CO2 23 11/01/2011 0700   GLUCOSE 103* 11/01/2011 0700  BUN 3* 11/01/2011 0700   CREATININE 0.44* 11/01/2011 0700   CALCIUM 9.2 11/01/2011 0700   PROT 6.5 10/31/2011 0500   ALBUMIN 2.8* 10/31/2011 0500   AST 13 10/31/2011 0500   ALT 5 10/31/2011 0500   ALKPHOS 62 10/31/2011 0500   BILITOT 0.6 10/31/2011 0500   GFRNONAA >90 11/01/2011 0700   GFRAA >90 11/01/2011 0700    GFR Estimated Creatinine Clearance: 90.2 ml/min (by C-G formula based on Cr of 0.44). No results found for this basename: LIPASE:2,AMYLASE:2 in the last 72 hours No results found for this basename: CKTOTAL:3,CKMB:3,CKMBINDEX:3,TROPONINI:3 in the last 72 hours No components found with this basename: POCBNP:3 No  results found for this basename: DDIMER:2 in the last 72 hours No results found for this basename: HGBA1C:2 in the last 72 hours No results found for this basename: CHOL:2,HDL:2,LDLCALC:2,TRIG:2,CHOLHDL:2,LDLDIRECT:2 in the last 72 hours  Basename 10/31/11 0500  TSH 2.271  T4TOTAL --  T3FREE --  THYROIDAB --   No results found for this basename: VITAMINB12:2,FOLATE:2,FERRITIN:2,TIBC:2,IRON:2,RETICCTPCT:2 in the last 72 hours Coags: No results found for this basename: PT:2,INR:2 in the last 72 hours Microbiology: No results found for this or any previous visit (from the past 240 hour(s)).   BRIEF HOSPITAL COURSE:   Principal Problem:  *Tongue cancer -was admitted, seen by ENT, various options for further long term treatment was discussed,patient still not decided whether to do definite surgery vs Chemo/RTX, currently his airway is not an issue as his malignancy involves the anterior aspect of the tongue.Long discussion was held with patient/family at bedside-by me and Dr Marquette Old plans are for the patient to be discharged home and will follow up at Dr Tona Sensing office next week for biopsy and further discussion of long term management of his malignancy. Patient is agreeable and is very anxious to be discharged home today.   Dehydration -resolved with IVF   Hyponatremia -better, this was due to above, Na significantly better at 134 today   Leucocytosis Resolved, no signs of infection-no antibiotics were started  Tobacco abuse -patient has been instructed to completely stop smoking, he has been counseled extensively. He claims understanding  Dysphagia -secondary to tongue mass -have asked patient to stay on a liquid diet, and to use nutritional supplements, till he decides on how to definitely deal with his malignancy     TODAY-DAY OF DISCHARGE:  Subjective:   Henry Leonard today has no headache,no chest abdominal pain,no new weakness tingling or numbness, feels much better  wants to go home today.   Objective:   Blood pressure 128/93, pulse 88, temperature 98.2 F (36.8 C), temperature source Axillary, resp. rate 20, height 5\' 6"  (1.676 m), weight 59.7 kg (131 lb 9.8 oz), SpO2 95.00%.  Intake/Output Summary (Last 24 hours) at 11/01/11 0931 Last data filed at 10/31/11 1843  Gross per 24 hour  Intake    598 ml  Output      0 ml  Net    598 ml    Exam Awake Alert, Oriented *3, No new F.N deficits, Normal affect [place].AT,PERRAL Supple Neck, +cervical lymphadenopathy Symmetrical Chest wall movement, Good air movement bilaterally, CTAB RRR,No Gallops,Rubs or new Murmurs, No Parasternal Heave +ve B.Sounds, Abd Soft, Non tender, No organomegaly appriciated, No rebound -guarding or rigidity. No Cyanosis, Clubbing or edema, No new Rash or bruise  DISPOSITION: Home   DISCHARGE INSTRUCTIONS:    Follow-up Information    Follow up with Burtis Junes, MD on 11/11/2011. (as walk in on Tues, Thur,or Fri 1-3pm)  Contact information:   Individual [address] M[address] [facility] [id number] [phone]       Follow up with Suzanna Obey, MD. Schedule an appointment as soon as possible for a visit in 3 days.   Contact information:   [facility] [address], [address] [place] [address] [phone]           Total Time spent on discharge equals 45 minutes.  SignedJeoffrey Massed 11/01/2011 9:31 AM

## 2011-11-01 NOTE — Progress Notes (Signed)
Call received from Dr. Suzanna Obey orders for 1% epinephrine, 10 cc syringe 22 gauge needle silver nitrate to and suction set up to bedside for biopsy. Supplies at bedside and meds entered into [facility], awaiting pharmacy to fill. Julien Nordmann [facility]

## 2011-11-01 NOTE — Progress Notes (Signed)
He is doing well. Has no new complaints. No airway difficulty. He is able to take fluids and solids reasonably well. His pain is well-controlled. No change in his exam. I discussed his situation with him again today. His family was in the room and we discussed surgery versus radiation treatment. He needs to have a biopsy performed but this would be much easier to perform in the office and the biopsy would not be processed this weekend anyway. He really has no need to be in the hospital and will be discharged by the hospital service. I talked about followup early next week for biopsy and he will be thinking about the mode of therapy that he would like to pursue.

## 2011-11-02 NOTE — ED Provider Notes (Signed)
Medical screening examination/treatment/procedure(s) were performed by non-physician practitioner and as supervising physician I was immediately available for consultation/collaboration.  Luiz Blare MD   Luiz Blare, MD 11/02/11 [phone]

## 2011-11-02 NOTE — ED Provider Notes (Signed)
Medical screening examination/treatment/procedure(s) were conducted as a shared visit with non-physician practitioner(s) and myself.  I personally evaluated the patient during the encounter  Worsening oral pain and swelling for the past several weeks.  Consistent with carcinoma    Dayton Bailiff, MD 11/02/11 1302

## 2011-11-11 DIAGNOSIS — C801 Malignant (primary) neoplasm, unspecified: Secondary | ICD-10-CM | POA: Insufficient documentation

## 2011-11-12 HISTORY — PX: BIOPSY TONGUE: PRO39

## 2011-11-17 HISTORY — PX: TRACHEOSTOMY: SUR1362

## 2011-11-17 HISTORY — PX: MULTIPLE TOOTH EXTRACTIONS: SHX2053

## 2011-11-19 ENCOUNTER — Ambulatory Visit
Admission: RE | Admit: 2011-11-19 | Discharge: 2011-11-19 | Disposition: A | Discharge status: Home / Self Care | Payer: Medicaid Other | Source: Ambulatory Visit | Attending: Radiation Oncology | Admitting: Radiation Oncology

## 2011-11-19 ENCOUNTER — Encounter: Payer: Self-pay | Admitting: Radiation Oncology

## 2011-11-19 ENCOUNTER — Ambulatory Visit: Payer: Self-pay | Admitting: Radiation Oncology

## 2011-11-19 ENCOUNTER — Ambulatory Visit: Payer: Self-pay

## 2011-11-19 VITALS — Wt 132.5 lb

## 2011-11-19 VITALS — BP 126/92 | HR 130 | Temp 98.6°F | Resp 20 | Ht 66.0 in | Wt 132.5 lb

## 2011-11-19 DIAGNOSIS — B370 Candidal stomatitis: Secondary | ICD-10-CM | POA: Insufficient documentation

## 2011-11-19 DIAGNOSIS — C029 Malignant neoplasm of tongue, unspecified: Secondary | ICD-10-CM

## 2011-11-19 DIAGNOSIS — Z800 Family history of malignant neoplasm of digestive organs: Secondary | ICD-10-CM | POA: Insufficient documentation

## 2011-11-19 DIAGNOSIS — F17200 Nicotine dependence, unspecified, uncomplicated: Secondary | ICD-10-CM | POA: Insufficient documentation

## 2011-11-19 DIAGNOSIS — C023 Malignant neoplasm of anterior two-thirds of tongue, part unspecified: Secondary | ICD-10-CM | POA: Insufficient documentation

## 2011-11-19 HISTORY — DX: Malignant neoplasm of tongue, unspecified: C02.9

## 2011-11-19 HISTORY — DX: Elevated white blood cell count, unspecified: D72.829

## 2011-11-19 MED ORDER — FLUCONAZOLE 10 MG/ML PO SUSR: 100.0000 mg | Freq: Every day | ORAL | Status: DC

## 2011-11-19 NOTE — Progress Notes (Addendum)
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 male who noticed a sore on his tongue in September 2012. He assumed that he had hurt his tongue on a tooth. However, the sore became worse and eventually grew into a tumor. The patient was admitted to the hospital with severe tongue ulceration, hyponatremia, 40 pound weight loss, dehydration on 10/30/2011.  During his admission, the patient saw Dr. Jearld Leonard of otolaryngology.   A biopsy was performed as an outpatient on 11/11/2011. The biopsy demonstrated squamous cell carcinoma. The patient has undergone CT scan of his neck on 10/30/2011. this demonstrates cervical adenopathy and a soft tissue mass in the tongue. I can appreciate a 2.8 cm left level II cervical lymph node in addition to an additional left level II lymph node that is 2.3 cm on axial measurement. Also, there are bilateral level I lymph nodes that are highly suspicious for cancer.he has not yet had a PET scan. He is still smoking and drinks at least 2-3 beers a day. He does not have a job nor a car. His neighbor brought him to his appointment today. He denies dysphagia but does have problems chewing.     PREVIOUS RADIATION THERAPY: No   PAST MEDICAL HISTORY:  has a past medical history of Tobacco abuse; Cancer of tongue (3/16/ 13); Leucocytosis (11/01/11); Dysphagia; and Cancer (11/11/2011).     PAST SURGICAL HISTORY:  Past Surgical History  Procedure Date  . Biopsy tongue 11/12/11    Tongue - Squamous Cell Cancer     FAMILY HISTORY: family history includes Cancer in his father; Esophageal cancer in his father; and Seizures in his mother.   SOCIAL HISTORY:  reports  that he has been smoking Cigarettes.  He has a 52.5 pack-year smoking history. He has never used smokeless tobacco. He reports that he drinks alcohol. He reports that he does not use illicit drugs.   ALLERGIES: Penicillins   MEDICATIONS:  Current Outpatient Prescriptions  Medication Sig Dispense Refill  . ENSURE (ENSURE) Take 237 mLs by mouth 3 (three) times daily between meals.  237 mL  12  . fluconazole (DIFLUCAN) 10 MG/ML suspension Take 10 mLs (100 mg total) by mouth daily. Take 20 mL (200 mg) today, then 10mL (100mg ) daily for 3 more weeks.  250 mL  0  . nicotine (NICODERM CQ) 14 mg/24hr patch Place 1 patch onto the skin daily.  28 patch  0     REVIEW OF SYSTEMS:  Pertinent items are noted in HPI.    PHYSICAL EXAM:  height is 5\' 6"  (1.676 m) and weight is 132 lb 8 oz (60.102 kg). His oral temperature is 98.6 F (37 C). His blood pressure is 126/92 and his pulse is 130. His respiration is 20.   General: Alert and oriented, in no acute distress.  He is drooling; speech is slurred  HEENT: Head is normocephalic.  Extraocular movements are intact. Oropharynx is notable for a very large tumor that is within the majority of the anterior portion of his oral tongue. The tongue is profoundly swollen. This tumor appears to be  at least 5 cm in greatest dimension. It is foul smelling and coated with thrush. Neck: Neck is notable for bilateral level Ib palpable adenopathy as well as a palpable level II left lymph node mass.  All lymph nodes are mobile. The level II lymph node mass on the left is approximately 3 cm in greatest dimension Heart: Regular in rate and rhythm with no murmurs, rubs, or gallops. Chest: Clear to auscultation bilaterally, with no rhonchi, wheezes, or rales. Abdomen: Soft, nontender, nondistended, with no rigidity or guarding. Extremities: No cyanosis or edema. Lymphatics: No concerning lymphadenopathy. Skin: No concerning lesions. Musculoskeletal: symmetric strength and  muscle tone throughout. Neurologic: He has impaired movement of his tongue but it is hard to tell if the muscles or nerves are involved or if this is merely due to the fact that the tongue is very swollen  Psychiatric: Judgment and insight are intact. Affect is appropriate.    LABORATORY DATA:  Lab Results  Component Value Date   WBC 9.7 11/01/2011   HGB 15.6 11/01/2011   HCT 48.2 11/01/2011   MCV 89.8 11/01/2011   PLT 390 11/01/2011   CMP     Component Value Date/Time   NA 134* 11/01/2011 0700   K 3.6 11/01/2011 0700   CL 101 11/01/2011 0700   CO2 23 11/01/2011 0700   GLUCOSE 103* 11/01/2011 0700   BUN 3* 11/01/2011 0700   CREATININE 0.44* 11/01/2011 0700   CALCIUM 9.2 11/01/2011 0700   PROT 6.5 10/31/2011 0500   ALBUMIN 2.8* 10/31/2011 0500   AST 13 10/31/2011 0500   ALT 5 10/31/2011 0500   ALKPHOS 62 10/31/2011 0500   BILITOT 0.6 10/31/2011 0500   GFRNONAA >90 11/01/2011 0700   GFRAA >90 11/01/2011 0700    PATHOLOGY: As above   RADIOLOGY: As above   IMPRESSION: 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 His prognosis is grave.  I recommend following   PLAN:  1) PET scan for staging and radiation planning of his cancer: I have scheduled for this to be done on 11/21/2011  2) I explained to the patient that he will need surgery to give him the best chance for local control (Assuming PET is negative for distant mets). Radiotherapy, even with chemotherapy, will not be a curative treatment. I will make a referral back to Dr. Jearld Leonard and hopefully surgery can be arranged in the near future. I wish for him to be referred back as soon as possible so that treatment planning can ideally start within 4 weeks of surgery to give him the best possible outcome  3) I will make a nutritionist referral for malnutrition/weight loss that is already an issue and that will worsen  during adjuvant therapy  4) Social work referral for social and economic issues  5) I will make a referral to med/onc for possible chemotherapy in the adjuvant setting  6) I will make an outpatient speech and swallow referral for evaluation and speech/swallowing therapy  7) I will make a dentistry referral for teeth extractions due to risk of dental and jaw problems following radiotherapy  8) in the future, I intend to make referral for PEG tube placement as he will have worsening swallowing issues and aspiration risk during adjuvant therapy  9) I advised the patient on using his nicotine patches for smoking cessation; we will see if he is motivated to do so. I also explained  to him that alcohol is a risk factor for his cancer. I explained that smoking cessation and avoiding alcohol are important to improve his chances of tolerating treatment and achieving cure  I spoke about all of the above plans with the patient. We talked about the acute effects of radiotherapy. I will review the side effects of radiotherapy, both acute and late, in detail when I see him following surgery. At that time a consent form will be signed.  I will do my very best to take care of this unfortunate patient.  Over 60 minutes were spent on face to face time with the patient, over half of that time was spent on counseling.  At least 45 additional minutes were spent after consultation for coordination of care.  Addendum: I Rx'd diflucan suspension for the patient's thrush. 3 week supply.

## 2011-11-19 NOTE — Progress Notes (Signed)
Single, not working, no children, applied for Medicaid, cared for ailing mother until her death 2 years ago, no family in state  Not wearing Nicotine patch at this time, smoking 10 cigs daily. Drinks Ensure.

## 2011-11-20 ENCOUNTER — Telehealth: Payer: Self-pay | Admitting: *Deleted

## 2011-11-20 ENCOUNTER — Ambulatory Visit (HOSPITAL_COMMUNITY): Payer: Self-pay | Admitting: Dentistry

## 2011-11-20 NOTE — Telephone Encounter (Signed)
Patient has an appt. On 11-26-11 at 10:30 a.m. With Zenovia Jarred

## 2011-11-21 ENCOUNTER — Other Ambulatory Visit (HOSPITAL_COMMUNITY): Payer: Self-pay | Admitting: Radiation Oncology

## 2011-11-21 ENCOUNTER — Encounter (HOSPITAL_COMMUNITY)
Admission: RE | Admit: 2011-11-21 | Discharge: 2011-11-21 | Disposition: A | Discharge status: Home / Self Care | Payer: Medicaid Other | Source: Ambulatory Visit | Attending: Radiation Oncology | Admitting: Radiation Oncology

## 2011-11-21 DIAGNOSIS — C029 Malignant neoplasm of tongue, unspecified: Secondary | ICD-10-CM

## 2011-11-21 DIAGNOSIS — I2510 Atherosclerotic heart disease of native coronary artery without angina pectoris: Secondary | ICD-10-CM | POA: Insufficient documentation

## 2011-11-21 DIAGNOSIS — K5730 Diverticulosis of large intestine without perforation or abscess without bleeding: Secondary | ICD-10-CM | POA: Insufficient documentation

## 2011-11-21 DIAGNOSIS — J984 Other disorders of lung: Secondary | ICD-10-CM | POA: Insufficient documentation

## 2011-11-21 DIAGNOSIS — K8020 Calculus of gallbladder without cholecystitis without obstruction: Secondary | ICD-10-CM | POA: Insufficient documentation

## 2011-11-21 DIAGNOSIS — N289 Disorder of kidney and ureter, unspecified: Secondary | ICD-10-CM | POA: Insufficient documentation

## 2011-11-21 LAB — GLUCOSE, CAPILLARY: Glucose-Capillary: 114 mg/dL — ABNORMAL HIGH (ref 70–99)

## 2011-11-21 MED ORDER — FLUDEOXYGLUCOSE F - 18 (FDG) INJECTION
17.1000 | Freq: Once | INTRAVENOUS | Status: AC | PRN
  Administered 2011-11-21: 17.1 via INTRAVENOUS

## 2011-11-24 ENCOUNTER — Other Ambulatory Visit (HOSPITAL_COMMUNITY): Payer: Self-pay | Admitting: Radiation Oncology

## 2011-11-24 DIAGNOSIS — B370 Candidal stomatitis: Secondary | ICD-10-CM

## 2011-11-24 DIAGNOSIS — C029 Malignant neoplasm of tongue, unspecified: Secondary | ICD-10-CM

## 2011-11-24 MED ORDER — FLUCONAZOLE 100 MG PO TABS: 100.0000 mg | ORAL_TABLET | Freq: Every day | ORAL | Status: AC

## 2011-11-26 ENCOUNTER — Other Ambulatory Visit (HOSPITAL_COMMUNITY): Payer: Self-pay | Admitting: Radiation Oncology

## 2011-11-26 ENCOUNTER — Ambulatory Visit: Payer: Self-pay | Admitting: Nutrition

## 2011-11-26 ENCOUNTER — Telehealth: Payer: Self-pay | Admitting: Oncology

## 2011-11-26 NOTE — Telephone Encounter (Signed)
called pt toschedule appt for next week and pt stated that he will check with transportation and call me back to schedule appt

## 2011-11-26 NOTE — Assessment & Plan Note (Signed)
Henry Leonard is a 56 year old male patient of Dr. Basilio Cairo diagnosed with tongue cancer.  History includes tobacco and alcohol usage.  MEDICATIONS INCLUDE:  NicoDerm and Diflucan.  LABS:  Sodium of 134, glucose of 103.  HEIGHT:  66 inches. WEIGHT:  132.5 pounds. USUAL BODY WEIGHT:  175 pounds. BMI:  21.4.  ESTIMATED NUTRITION NEEDS:  2000-2200 calories, 90-102 g of protein, 2.2 L of fluid.  The patient reports he can swallow perfectly fine.  However, he has difficulty chewing and foods do stick to his tongue and he is not able to manipulate them through his mouth to swallow them.  He tolerates liquids.  He has lost approximately 42-1/2 pounds which is a 24% weight loss from his usual body weight.  Patient is drinking a variety of nutrition supplements including Special K drinks, milk shakes, Ensure or Equate.  He is really not able to tolerate solid food at this point in time.  He understands he is going to need a feeding tube.  However, he has not scheduled that placement yet.  NUTRITION DIAGNOSIS:  Unintentional weight loss related to new diagnosis of tongue cancer as evidenced by a 24% weight loss from usual body weight.  INTERVENTION:  I educated Henry Leonard to increase Ensure Plus or equivalent nutritional supplements such as Equate Plus or Boost Plus to a minimum of 6 cans daily.  This will provide 2100 calories and 78 g of protein.  This will meet 100% of his calorie needs, but fall short a bit on his protein needs.  He is to continue to drink protein drinks as desired, and I have provided him with some sample products as well as a complimentary case of Ensure Plus for him to take with him today.  He is Medicaid pending and so tube feedings will be initiated as soon as his tube is placed as he is unable to tolerate oral diet.  MONITORING/EVALUATION (GOALS):  The patient will tolerate a minimum of 6 Ensure Plus daily to provide minimum estimated caloric needs and minimize further weight  loss.  NEXT VISIT:  I have not scheduled a followup with the patient as his treatment schedule has not been placed in the computer yet.  However, I have given him my contact information.  He is to call me if he has problems or concerns prior to his start of treatment.   ______________________________ Zenovia Jarred, RD, LDN Clinical Nutrition Specialist BN/MEDQ  D:  11/26/2011  T:  11/26/2011  Job:  [id number]

## 2011-11-27 ENCOUNTER — Ambulatory Visit (HOSPITAL_COMMUNITY)
Admission: RE | Admit: 2011-11-27 | Discharge: 2011-11-27 | Disposition: A | Discharge status: Home / Self Care | Payer: Medicaid Other | Source: Ambulatory Visit | Attending: Radiation Oncology | Admitting: Radiation Oncology

## 2011-11-27 ENCOUNTER — Other Ambulatory Visit (HOSPITAL_COMMUNITY): Payer: Self-pay

## 2011-11-27 ENCOUNTER — Telehealth: Payer: Self-pay | Admitting: Oncology

## 2011-11-27 ENCOUNTER — Ambulatory Visit: Payer: Medicaid Other | Attending: Radiation Oncology

## 2011-11-27 DIAGNOSIS — R1310 Dysphagia, unspecified: Secondary | ICD-10-CM | POA: Insufficient documentation

## 2011-11-27 DIAGNOSIS — R1311 Dysphagia, oral phase: Secondary | ICD-10-CM | POA: Insufficient documentation

## 2011-11-27 DIAGNOSIS — C029 Malignant neoplasm of tongue, unspecified: Secondary | ICD-10-CM

## 2011-11-27 DIAGNOSIS — IMO0001 Reserved for inherently not codable concepts without codable children: Secondary | ICD-10-CM | POA: Insufficient documentation

## 2011-11-27 NOTE — Telephone Encounter (Signed)
called pt scheduled appt for 04/18.  faxed over  letter to Dr. Rana Snare wth appt d/t

## 2011-11-27 NOTE — Procedures (Signed)
Objective Swallowing Evaluation: Modified Barium Swallowing Study  Patient Details  Name: OMRI BERTRAN MRN: [id number] Date of Birth: 1956-04-21  Today's Date: 11/27/2011  Past Medical History:  Past Medical History  Diagnosis Date  . Tobacco abuse   . Cancer of tongue 3/16/ 13    Associated Neck Nodes  . Leucocytosis 11/01/11  . Dysphagia   . Cancer 11/11/2011    tongue, squamous cell carcinoma   Past Surgical History:  Past Surgical History  Procedure Date  . Biopsy tongue 11/12/11    Tongue - Squamous Cell Cancer   HPI:  56 year old male with onset of oral discomfort that began over one year ago. He saw a physician in March and has been diagnosed with cancer of the tongue and lymph nodes, which has not spread beyond his head/neck. Pt denies any history of PNA, CVA, or dysphagia prior to his current condition. He does report occasional reflux, which he takes Tums for. He has reportedly had a significant weight loss, and is currently seeing a nutritionist. The patient is in the process of scheduling surgery and radiation, and presents as an OP to [facility] to assess his current oropharyngeal function. He is with c/o difficulty with oral manipulation of boluses, primarily with solids, and is only consuming liquids of all consistencies at home.   Recommendation/Prognosis  Clinical Impression Dysphagia Diagnosis: Severe oral phase dysphagia Clinical impression: Pt presents with a severe oral dysphagia characterized by a significantly reduced lingual ROM, resulting in poor oral control, manipulation, propulsion, and oral containment of bolus, causing a delayed swallow initiation to the pyriform sinuses, which resulted in moderate flash penetration with only large cup sips of thin liquid. At this time, pharyngeal phase of swallow is normal. Recommend a full liquid diet with no restrictions in liquid consistencies. Also recommed small sips to decrease risk of penetration during the swallow as  well as thorough oral care before/after PO intake to decrease risk of infection. Education completed with patient regarding potential effects of radiation tx on pharyngeal swallow and recs for OP SLP f/u.  Swallow Evaluation Recommendations Diet Recommendations: Thin liquid;Nectar-thick liquid;Honey-thick liquid (Full liquid diet, no restrictions with consistencies) Liquid Administration via: Cup;No straw Medication Administration: Whole meds with liquid Supervision: Patient able to self feed Compensations: Slow rate;Small sips/bites;Check for pocketing;Check for anterior loss Postural Changes and/or Swallow Maneuvers: Seated upright 90 degrees Oral Care Recommendations: Oral care BID;Oral care before and after PO Follow up Recommendations: Outpatient SLP (Pt has appointment with OP SLP 4/11) Prognosis Prognosis for Safe Diet Advancement: Guarded Barriers to Reach Goals: Severity of dysphagia Individuals Consulted Consulted and Agree with Results and Recommendations: Patient;Other (Comment) ([address] [place])  Maxcine Ham 11/27/2011, 1:47 PM  Maxcine Ham, SLP Student

## 2011-12-01 ENCOUNTER — Other Ambulatory Visit: Payer: Self-pay | Admitting: Radiation Oncology

## 2011-12-01 ENCOUNTER — Ambulatory Visit (HOSPITAL_COMMUNITY): Payer: Self-pay | Admitting: Dentistry

## 2011-12-01 ENCOUNTER — Encounter (HOSPITAL_COMMUNITY): Payer: Self-pay | Admitting: Dentistry

## 2011-12-01 DIAGNOSIS — K036 Deposits [accretions] on teeth: Secondary | ICD-10-CM

## 2011-12-01 DIAGNOSIS — K045 Chronic apical periodontitis: Secondary | ICD-10-CM

## 2011-12-01 DIAGNOSIS — M270 Developmental disorders of jaws: Secondary | ICD-10-CM

## 2011-12-01 DIAGNOSIS — K083 Retained dental root: Secondary | ICD-10-CM

## 2011-12-01 DIAGNOSIS — K08109 Complete loss of teeth, unspecified cause, unspecified class: Secondary | ICD-10-CM

## 2011-12-01 DIAGNOSIS — M264 Malocclusion, unspecified: Secondary | ICD-10-CM

## 2011-12-01 DIAGNOSIS — K0530 Chronic periodontitis, unspecified: Secondary | ICD-10-CM

## 2011-12-01 DIAGNOSIS — K0889 Other specified disorders of teeth and supporting structures: Secondary | ICD-10-CM

## 2011-12-01 MED ORDER — CHLORHEXIDINE GLUCONATE 0.12 % MT SOLN: OROMUCOSAL | Status: AC

## 2011-12-01 NOTE — Patient Instructions (Signed)

## 2011-12-01 NOTE — Progress Notes (Signed)
DENTAL CONSULTATION  Date of Consultation:  12/01/2011 Patient Name:   Henry Leonard Date of Birth:   1956/06/13 Medical Record Number: [id number]  VITALS: BP 109/68  Pulse 129  Temp 98.9 F (37.2 C)   CHIEF COMPLAINT: Patient presents for a pre-chemoradiation therapy dental protocol evaluation  HPI: Henry Leonard is a 56 year old male recently diagnosed with squamous cell carcinoma of the oral tongue. Patient with possible surgical resection with reconstruction at [facility], followed by chemoradiation therapy. Patient is now seen as part of a pre-chemoradiation therapy dental protocol evaluation    PMH: Past Medical History  Diagnosis Date  . Tobacco abuse   . Leucocytosis 11/01/11  . Dysphagia   . Cancer of tongue 3/16/ 13    Associated Neck Nodes  . Cancer 11/11/2011    oral tongue, squamous cell carcinoma  . Seasonal allergies     PSH: Past Surgical History  Procedure Date  . Biopsy tongue 11/12/11    Tongue - Squamous Cell Cancer    ALLERGIES: Allergies  Allergen Reactions  . Penicillins Rash    MEDICATIONS: Current Outpatient Prescriptions  Medication Sig Dispense Refill  . ENSURE (ENSURE) Take 237 mLs by mouth 3 (three) times daily between meals.  237 mL  12  . fluconazole (DIFLUCAN) 100 MG tablet Take 1 tablet (100 mg total) by mouth daily. Take 2 tablets today, then 1 tablet daily x 20 more days  22 tablet  0  . nicotine (NICODERM CQ) 14 mg/24hr patch Place 1 patch onto the skin daily.  28 patch  0    LABS: Lab Results  Component Value Date   WBC 9.7 11/01/2011   HGB 15.6 11/01/2011   HCT 48.2 11/01/2011   MCV 89.8 11/01/2011   PLT 390 11/01/2011      Component Value Date/Time   NA 134* 11/01/2011 0700   K 3.6 11/01/2011 0700   CL 101 11/01/2011 0700   CO2 23 11/01/2011 0700   GLUCOSE 103* 11/01/2011 0700   BUN 3* 11/01/2011 0700   CREATININE 0.44* 11/01/2011 0700   CALCIUM 9.2 11/01/2011 0700   GFRNONAA >90 11/01/2011 0700   GFRAA >90  11/01/2011 0700   No results found for this basename: INR, PROTIME   No results found for this basename: PTT    SOCIAL HISTORY: History   Social History  . Marital Status: Legally Separated    Spouse Name: N/A    Number of Children: N/A  . Years of Education: N/A   Occupational History  . Unemployed    Social History Main Topics  . Smoking status: Current Everyday Smoker -- 1.5 packs/day for 35 years    Types: Cigarettes  . Smokeless tobacco: Never Used   Comment: 11/19/11 currently 10 cigs daily  . Alcohol Use: Yes     2-3 beers a day  . Drug Use: No  . Sexually Active:    Other Topics Concern  . Not on file   Social History Narrative   Patient with history of smoking 1.5 packs a day for 35 years. Patient still drinking 2-3 beers per day.Is unemployed. Patient has no car. Neighbor assists in transportation issues.    FAMILY HISTORY: Family History  Problem Relation Age of Onset  . Esophageal cancer Father   . Cancer Father     esophageal  . Seizures Mother      REVIEW OF SYSTEMS: Reviewed with patient and is positive as above.  DENTAL HISTORY: CHIEF COMPLAINT: Patient presents for a  pre-chemoradiation therapy dental protocol evaluation  HPI: Henry Leonard is a 56 year old male recently diagnosed with squamous cell carcinoma of the oral tongue. Patient with possible surgical resection with reconstruction at [facility], followed by chemoradiation therapy. Patient is now seen as part of a pre-chemoradiation therapy dental protocol evaluation  Patient currently denies acute toothache, swellings, or abscesses. Patient does have significant oral pain secondary to the cancer. Patient is having a difficult time keeping his teeth cleaned. Patient last saw a dentist in 1999 or 2000 and. Patient was evaluated for an abscess at that time. Patient had a tooth pulled at that time with no complications by patient report. Patient indicates was performed by Dr. Llana Aliment in [facility] Patient denies having regular dental care due to economic concerns. Patient has no partial dentures.   DENTAL EXAMINATION:  GENERAL: Patient is a well-developed male in no acute distress. HEAD AND NECK: Patient with right and left neck lymphadenopathy. Patient denies acute TMJ symptoms. Patient has a maximum interincisal opening of 32 mm. INTRAORAL EXAM: Patient with very poor oral hygiene. The generalized portion of the anterior two thirds the tongue is consistent with a cancer diagnosis. Oral thrush appears to be present on the tongue. Patient has moderate, bilateral mandibular lingual tori. DENTITION: Patient is missing tooth numbers 30, 31, and 32. Patient has multiple retained root segments in the area of tooth numbers 16, 17, 18, and 19. Patient has multiple malpositioned teeth. PERIODONTAL: Patient with chronic, advanced periodontal disease with plaque and calculus accumulations, gingival recession, and tooth mobility. There is moderate to severe bone loss. DENTAL CARIES/SUBOPTIMAL RESTORATIONS: Patient has multiple dental caries noted as per dental charting form. Although due to the extensive plaque and calculus caries may have gone undetected from my clinical exam. ENDODONTIC: Patient currently denies acute pulpitis symptoms. Patient does appear to have multiple areas of periapical pathology and radiolucency associated with the retained root segments. CROWN AND BRIDGE: There are no crown restorations. PROSTHODONTIC: Patient denies presence of partial dentures. OCCLUSION: Patient with a poor occlusal scheme secondary to multiple missing teeth, multiple retained root segments, supra-eruption and drifting of the unopposed teeth into the edentulous areas, multiple malpositioned teeth, and lack of replacement of the missing teeth with dental prostheses.  RADIOGRAPHIC INTERPRETATION: A panoramic x-ray was obtained along with 6 upper and 2 lower periapical  radiographs. Further x-rays unable to be obtained due to patient pain and the presence of the cancer. Multiple missing teeth. There are multiple retained root segments. There are multiple dental caries. There is moderate to severe bone loss. There is periapical pathology. There is supra-eruption and drifting of the unopposed teeth into the edentulous areas. Poor occlusal scheme noted.    ASSESSMENTS: 1. Chronic periodontitis with moderate to severe bone loss. 2. Significant plaque and calculus accumulations 3. Tooth mobility 4. Generalized gingival recession 5. Missing teeth 6. Multiple retained root segments 7. Chronic apical periodontitis 8. Multiple malpositioned teeth 9. Supra-eruption and drifting of the unopposed teeth into the edentulous areas 10. Malocclusion and poor occlusal scheme 11. Dental caries 12. Bilateral mandibular lingual tori 13. History of oral neglect.    PLAN/RECOMMENDATIONS: 1. I discussed the risks, benefits, and complications of various treatment options with the patient in relationship to his medical and dental conditions. We discussed various treatment options to include no treatment, multiple extractions with alveoloplasty, pre-prosthetic surgery as indicated, periodontal therapy, dental restorations, root canal therapy, crown and bridge therapy, implant therapy, and replacement of missing teeth as  indicated. The patient currently wishes to proceed with extraction of remaining teeth with alveoloplasty and bilateral mandibular lingual tori. Hopefully, this can be accomplished at [facility] if the patient pursues surgical tongue resection and reconstructive surgery with the head and neck surgeon and Hospital dental teams at [facility]. Prosthodontics rehabilitation for the patient will be determined after the surgical resection results. Patient most likely will benefit from an oral and maxillofacial prosthodontist for dental rehabilitation.  2.  Discussion of findings with Dr. Basilio Cairo, [facility] and Neck surgery team and Hospital Dental team at [facility] as indicated.   Charlynne Pander, DDS

## 2011-12-04 ENCOUNTER — Ambulatory Visit (HOSPITAL_BASED_OUTPATIENT_CLINIC_OR_DEPARTMENT_OTHER): Payer: Medicaid Other | Admitting: Oncology

## 2011-12-04 ENCOUNTER — Encounter: Payer: Self-pay | Admitting: Oncology

## 2011-12-04 ENCOUNTER — Ambulatory Visit: Payer: Self-pay

## 2011-12-04 ENCOUNTER — Telehealth: Payer: Self-pay | Admitting: Oncology

## 2011-12-04 ENCOUNTER — Other Ambulatory Visit (HOSPITAL_BASED_OUTPATIENT_CLINIC_OR_DEPARTMENT_OTHER): Payer: Medicaid Other

## 2011-12-04 VITALS — BP 134/89 | HR 126 | Temp 96.7°F | Ht 66.0 in | Wt 130.0 lb

## 2011-12-04 DIAGNOSIS — C029 Malignant neoplasm of tongue, unspecified: Secondary | ICD-10-CM

## 2011-12-04 LAB — CBC WITH DIFFERENTIAL/PLATELET
EOS%: 2.3 % (ref 0.0–7.0)
MCH: 29.1 pg (ref 27.2–33.4)
MCV: 88.4 fL (ref 79.3–98.0)
MONO%: 9.1 % (ref 0.0–14.0)
NEUT#: 7.9 10*3/uL — ABNORMAL HIGH (ref 1.5–6.5)
RBC: 6.2 10*6/uL — ABNORMAL HIGH (ref 4.20–5.82)
RDW: 13.3 % (ref 11.0–14.6)
nRBC: 0 % (ref 0–0)

## 2011-12-04 LAB — COMPREHENSIVE METABOLIC PANEL
ALT: 8 U/L (ref 0–53)
AST: 14 U/L (ref 0–37)
Alkaline Phosphatase: 58 U/L (ref 39–117)
Sodium: 133 mEq/L — ABNORMAL LOW (ref 135–145)
Total Bilirubin: 0.2 mg/dL — ABNORMAL LOW (ref 0.3–1.2)
Total Protein: 8.2 g/dL (ref 6.0–8.3)

## 2011-12-04 NOTE — Telephone Encounter (Signed)
appts made and printed for pt aom °

## 2011-12-04 NOTE — Progress Notes (Signed)
[facility]  Telephone:([phone]) [phone] [phone]   MEDICAL ONCOLOGY - INITIAL CONSULATION    Referral MD:  Dr. Lonie Peak, M.D.  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    HPI:  Henry Leonard is a 56 yo Caucasian man with extensive history of smoking since he was 56 year-old.  10 months prior to presentation, he developed a ulcer in the left lateral tongue. He did not have insurance and did not have any medical care. Over the past 3 months, this lesion has significantly enlarged to possibly 5 cm. He has problem with occasional bleeding from the mass, speaking, pain when he swallows, leaking saliva. He also developed bilateral neck node with the last few weeks. He has about 30-40 pound weight loss over the last 10 months.  He presented to urgent care and had a CT of the neck and was referred to Dr. Jearld Fenton for evaluation. He underwent tongue biopsy on 11/11/2011 with pathology case number is [id number] consistent with squamous cell carcinoma. He was kindly referred to to the cancer center for evaluation.  Henry Leonard presented to the clinic for the first time by himself. He reports persistent growth of the tongue mass in addition 2 pounds persistent weight loss, leaking saliva, and growth of the bilateral neck node. He does not have any significant pain that requires pain medication. He has mild fatigue because of the weight loss and decreased nutrition   ; however, he is independent of all activities of daily living.   Patient denies headache, visual changes, confusion, drenching night sweats, nausea vomiting, jaundice, chest pain, palpitation, shortness of breath, dyspnea on exertion, productive cough, gum bleeding, epistaxis, hematemesis, hemoptysis, abdominal pain, abdominal swelling, early satiety, melena, hematochezia, hematuria, skin rash, spontaneous  bleeding, joint swelling, joint pain, heat or cold intolerance, bowel bladder incontinence, back pain, focal motor weakness, paresthesia, depression, suicidal or homocidal ideation, feeling hopelessness.     Past Medical History  Diagnosis Date  . Tobacco abuse   . Leucocytosis 11/01/11  . Dysphagia   . Cancer of tongue 3/16/ 13    Associated Neck Nodes  . Seasonal allergies   :  Past Surgical History  Procedure Date  . Biopsy tongue 11/12/11    Tongue - Squamous Cell Cancer  :  Current Outpatient Prescriptions  Medication Sig Dispense Refill  . chlorhexidine (PERIDEX) 0.12 % solution Rinse with 10 mls three times daily for 30 seconds. Use after breakfast, lunch,  and at bedtime. Spit out excess. Do not swallow.  480 mL  PRN  . ENSURE (ENSURE) Take 237 mLs by mouth 3 (three) times daily between meals.  237 mL  12  . fluconazole (DIFLUCAN) 100 MG tablet Take 1 tablet (100 mg total) by mouth daily. Take 2 tablets today, then 1 tablet daily x 20 more days  22 tablet  0     Allergies  Allergen Reactions  . Penicillins Rash  :  Family History  Problem Relation Age of Onset  . Esophageal cancer Father   . Cancer Father     esophageal  . Seizures Mother   :  History   Social History  . Marital Status: Legally Separated    Spouse Name: N/A    Number of Children: 0  . Years of Education: N/A   Occupational History  . Unemployed    Social History Main Topics  .  Smoking status: Current Everyday Smoker -- 1.5 packs/day for 35 years    Types: Cigarettes  . Smokeless tobacco: Never Used   Comment: 11/19/11 currently 10 cigs daily  . Alcohol Use: Yes     2-3 beers a day  . Drug Use: No  . Sexually Active:    Other Topics Concern  . Not on file   Social History Narrative   Patient with history of smoking 1.5 packs a day for 35 years. Patient still drinking 2-3 beers per day.Is unemployed. Patient has no car. Neighbor assists in transportation issues.  :  Pertinent  items are noted in HPI.  Exam: ECOG 1  General:  Thin-appearing man in no acute distress.  Eyes:  no scleral icterus.  ENT:  There was a fungating left anterior tongue mass about 5cm with associating blistering.  Neck was without thyromegaly.  Lymphatics:  Positive for bilateral cervical neck nodes about 4x3cm.  Negative for supraclavicular or axillary adenopathy.  Respiratory: lungs were clear bilaterally without wheezing or crackles.  Cardiovascular:  Regular rate and rhythm, S1/S2, without murmur, rub or gallop.  There was no pedal edema.  GI:  abdomen was soft, flat, nontender, nondistended, without organomegaly.  Muscoloskeletal:  no spinal tenderness of palpation of vertebral spine.  Skin exam was without echymosis, petichae.  Neuro exam was nonfocal.  Patient was able to get on and off exam table without assistance.  Gait was normal.  Patient was alerted and oriented.  Attention was good.   Language was appropriate.  Mood was normal without depression.  Speech was not pressured.  Thought content was not tangential.     Lab Results  Component Value Date   WBC 11.9* 12/04/2011   HGB 18.1* 12/04/2011   HCT 54.8* 12/04/2011   PLT 365 12/04/2011   GLUCOSE 91 12/04/2011   ALT 8 12/04/2011   AST 14 12/04/2011   NA 133* 12/04/2011   K 3.9 12/04/2011   CL 94* 12/04/2011   CREATININE 0.54 12/04/2011   BUN 10 12/04/2011   CO2 28 12/04/2011   TSH 2.271 10/31/2011    Nm Pet Image Initial (pi) Skull Base To Thigh  11/21/2011  *RADIOLOGY REPORT*  Clinical Data: Initial treatment strategy for squamous cell carcinoma of the tongue.  NUCLEAR MEDICINE PET SKULL BASE TO THIGH  Fasting Blood Glucose:  114  Technique:  17.1 mCi F-18 FDG was injected intravenously. CT data was obtained and used for attenuation correction and anatomic localization only.  (This was not acquired as a diagnostic CT examination.) Additional exam technical data entered on technologist worksheet.  Comparison:  No priors.  Findings:  Neck: The  left side of the tongue is diffusely hypermetabolic (SUVmax = 17.8), compatible with the patient's reported squamous cell carcinoma of the tongue.  In the right submandibular region ( level IB) there is a enlarged 2.4 x 1.8 cm hypermetabolic (SUVmax = 5.9) lymph node compatible with a nodal metastasis.Posterior to the left mandibular condyle (level II nodes) there are two enlarged lymph nodes measuring 3.3 x 2.0 and 2.9 x 2.3 cm both of which are hypermetabolic (SUVmax = 10.7 and 5.8 respectively).  Chest:  No pathologically enlarged or hypermetabolic mediastinal or hilar lymph nodes. Heart size is normal. There is no significant pericardial fluid, thickening or pericardial calcification. There is atherosclerosis of the thoracic aorta, the great vessels of the mediastinum and the coronary arteries, including calcified atherosclerotic plaque in the left main, left anterior descending, left circumflex and right coronary arteries.  Esophagus is unremarkable in appearance there is a thin-walled multi locular cavitary lesion in the apex of the left upper lobe that measures approximately 2.1 x 1.9 cm, likely to represent an area of post infectious scarring.  Peripheral pleural based scarring is also seen scattered throughout the remainder of the left lung.  Right lung is unremarkable.  No definite suspicious appearing pulmonary nodules or masses are identified to strongly suggest the presence of metastatic lesions.  Abdomen/Pelvis:  No abnormal hypermetabolic activity within the liver, pancreas, adrenal glands, or spleen.  No hypermetabolic lymph nodes in the abdomen or pelvis.  A small calcified gallstone is noted dependently within the gallbladder lumen.  The gallbladder does not appear distended, nor is there pericholecystic fluid or stranding at this time to suggest acute cholecystitis.  The unenhanced appearance of the liver, pancreas, spleen and bilateral adrenal glands is unremarkable.  Within the kidneys  bilaterally there are small exophytic low attenuation lesions, the largest of which measures 2.4 cm in diameter in the lower pole of the right kidney.  Although neither of these are technically characterized on this examination, neither demonstrate hypermetabolic activity. Nonspecific bilateral perinephric stranding is noted, which can be seen in the setting of renal insufficiency.  Extensive atherosclerosis throughout the abdominal pelvic vasculature, without evidence of focal aneurysm.  There is no ascites or pneumoperitoneum and no pathologic distension of bowel. The urinary bladder wall is diffusely thickened (PET is insensitive for detection of urothelial abnormalities of the bladder secondary to high activity within the excreted urine).  There are a few colonic diverticula, without surrounding inflammatory changes to suggest acute diverticulitis at this time.  [place]:  No focal hypermetabolic activity to suggest skeletal metastasis.  IMPRESSION: 1.  Findings, as above, compatible with primary tongue neoplasm with local nodal spread (one level IB node on the right and two level II nodes on the left).  No evidence of distal metastatic disease within the chest, abdomen or pelvis on this examination. 2. Atherosclerosis, including left main and three-vessel coronary artery disease. Please note that although the presence of coronary artery calcium documents the presence of coronary artery disease, the severity of this disease and any potential stenosis cannot be assessed on this non-gated CT examination.  Assessment for potential risk factor modification, dietary therapy or pharmacologic therapy may be warranted, if clinically indicated. 3.  Thin-walled cavitary lesion in the apex of the left upper lobe does not demonstrate hypermetabolic activity along its margin, and has appearance most suggestive of an area of post infectious scarring.  Other areas of apparent scarring are also noted the periphery of the left  lung. 4. Cholelithiasis without evidence to suggest acute cholecystitis. 5. Colonic diverticulosis without evidence to suggest acute diverticulitis. 6.  Bilateral low attenuation renal lesions favored to be cysts, although not technically characterize and today's examination. These could be further evaluated with renal ultrasound if of clinical concern. 7.  Profound bladder wall thickening.  This is nonspecific, however, correlation with urine cytology may be warranted to exclude an underlying urothelial neoplasm.     ASSESSMENT AND PLAN:  1.  Smoking: I advised patient that the etiology of this cancer is from smoking. He may require resection with graft placement in the near future. Continuations of smoking will decrease the therapy of the graft. I strongly urged him to stop smoking as soon as possible the next few days. He has nicotine patch on right now.  I will refer him to smoking cessation class.   2.  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  Chance of cure is about 60%. -Broad treatment outline:  Upfront resection of primary oral tongue cancer and bilateral neck dissection.  - Most likely followed by chemoradiation to decrease the risk of local recurrence (depending on pathology risk factors at resection).   3.  Moderate calorie/protein malnutrition:  Stabilized weight loss with Ensure.   4.  Lack of social support:  I discussed the case with Social Work to see what we can do to help.   4.  Follow up: [facility] evaluation for surgery on 12/15/2011. . - Return to clinic post op to determine the need for adjuvant chemoradiation.    The length of time of the face-to-face encounter was 45 minutes. More than 50% of time was spent counseling and coordination of care.

## 2011-12-04 NOTE — Progress Notes (Signed)
Patient came in today, patient does not currently have insurance I gave patient EPP application, patient will fill out. Patient has already filled out Medicaid application and applied for disability. He stated he was waiting to hear back.

## 2011-12-04 NOTE — Patient Instructions (Signed)
1.  Your Diagnosis:   oral tongue squamous cell carcinoma 2.  Stage/Prognosis:  Stage IVB (due to bilateral neck nodes; still aiming to cure).  Chance of cure is about 60%. 3.  Broad treatment outline - Upfront resection of the cancer in the tongue and bilateral neck resection to remove the cancer in the lymph nodes. - Most likely followed by chemoradiation to decrease the risk of local recurrence (depending on pathology risk factors at resection).   4.  Follow up: [facility] evaluation for surgery. - Return to Dr. Basilio Cairo (radiation) and Dr. Gaylyn Rong (medical oncology) after surgery to see if you need to get chemo/radiation.

## 2011-12-11 ENCOUNTER — Telehealth: Payer: Self-pay | Admitting: *Deleted

## 2011-12-11 NOTE — Telephone Encounter (Signed)
Patient referred by Dr. Gaylyn Rong to smoking cessation classes. [facility] Smoking Cessation class facilitator called patient 12/11/11 to let patient know about our smoking cessation program at the [facility]. Patient is not interested in the classes but would like some information. Will put together information for patient and will give to patient at his next appointment. Patient verbalized understanding.

## 2011-12-12 NOTE — Procedures (Signed)
I have read this report and agree with the plan.   Buckner Malta, M.D.

## 2012-01-07 ENCOUNTER — Other Ambulatory Visit: Payer: Self-pay

## 2012-01-07 ENCOUNTER — Ambulatory Visit: Payer: Self-pay | Admitting: Oncology

## 2012-01-19 HISTORY — PX: GLOSSECTOMY: SUR645

## 2012-01-27 NOTE — Progress Notes (Signed)
Encounter addended by: Tessa Lerner, RN on: 01/27/2012 10:03 AM<BR>     Documentation filed: Charges VN

## 2012-03-25 ENCOUNTER — Telehealth: Payer: Self-pay | Admitting: Radiation Oncology

## 2012-03-25 NOTE — Telephone Encounter (Signed)
RETROSPECTIVE PET CT - CASE ID: [id number]  [employer] requested clinicals for Pet Ct skull base to mid thigh - Dos: 11/21/2011.  Faxed to: [phone]  Ph: [phone]

## 2013-05-11 DIAGNOSIS — C159 Malignant neoplasm of esophagus, unspecified: Secondary | ICD-10-CM

## 2013-05-11 HISTORY — DX: Malignant neoplasm of esophagus, unspecified: C15.9

## 2013-06-09 ENCOUNTER — Ambulatory Visit: Payer: Medicaid Other | Admitting: Radiation Oncology

## 2013-06-09 ENCOUNTER — Encounter: Payer: Self-pay | Admitting: Radiation Oncology

## 2013-06-09 ENCOUNTER — Ambulatory Visit: Payer: Medicaid Other

## 2013-06-09 NOTE — Progress Notes (Signed)
Thoracic Location of Tumor / Histology: T3 Esophageal   Patient presented Distal Esophageal mass on CT which was ordered as a follow-up for his previous Heand and Neck Cancer of the oral tongue in 2013.  Biopsies of Esophagus (if applicable) revealed: Well Differentiatied Squamous Cell Carcinoma of the Esophagus invading into the adventitia with several hypermetabolic gastrohepatic lymph nodes, nearly obstructing mass.  Tobacco/Marijuana/Snuff/ETOH use: Smokes 1/2 ppd, 2-3 drinks daily, No drugs  Past/Anticipated interventions by cardiothoracic surgery, if any: ?  Past/Anticipated interventions by medical oncology, if any: Chemotherapy with Cisplatin for his prior Cancer diagnosis:  Squamous Cell Carcinomaof the Oral Tongue in March 2013  Signs/Symptoms  Weight changes, if any:  16 lb weight loss in the past 6 months documented on 05/26/13. Only drinking Osmolite for the past year since his Treatment for Oral Tongue cancer 11/01/11, but now only drinking 3 Osmolite daily.  He states that "liquids, especially water, occasionally.  catches near his adams apple but eventually goes down after a few episodes of burping.   No gastrostomy tube. Edentulous  Respiratory complaints, if any: None  Hemoptysis, if any: No  Pain issues, if any: ." Has mild epigastric pain on occasion."    SAFETY ISSUES:  Prior radiation? Radiation Therapy 2013 for Squamous Cell Carcinoma of the Tongue in 2013  Pacemaker/ICD? No  Possible current pregnancy? N/A  Is the patient on methotrexate? 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 Radiation Therapy to the Surgical bed and bilateral neck / 60 Gy / 30 Fractions VMAT Plan 02/24/12 - 04/06/12 at [facility] hospital

## 2013-06-10 ENCOUNTER — Ambulatory Visit
Admission: RE | Admit: 2013-06-10 | Discharge: 2013-06-10 | Disposition: A | Discharge status: Home / Self Care | Payer: Medicaid Other | Source: Ambulatory Visit | Attending: Radiation Oncology | Admitting: Radiation Oncology

## 2013-06-10 ENCOUNTER — Encounter: Payer: Self-pay | Admitting: Radiation Oncology

## 2013-06-10 VITALS — BP 129/89 | HR 130 | Temp 94.7°F | Ht 66.0 in | Wt 112.7 lb

## 2013-06-10 DIAGNOSIS — Z9221 Personal history of antineoplastic chemotherapy: Secondary | ICD-10-CM | POA: Insufficient documentation

## 2013-06-10 DIAGNOSIS — C029 Malignant neoplasm of tongue, unspecified: Secondary | ICD-10-CM | POA: Insufficient documentation

## 2013-06-10 DIAGNOSIS — Z800 Family history of malignant neoplasm of digestive organs: Secondary | ICD-10-CM | POA: Insufficient documentation

## 2013-06-10 DIAGNOSIS — C155 Malignant neoplasm of lower third of esophagus: Secondary | ICD-10-CM

## 2013-06-10 DIAGNOSIS — F17200 Nicotine dependence, unspecified, uncomplicated: Secondary | ICD-10-CM | POA: Insufficient documentation

## 2013-06-10 DIAGNOSIS — I890 Lymphedema, not elsewhere classified: Secondary | ICD-10-CM | POA: Insufficient documentation

## 2013-06-10 DIAGNOSIS — Z931 Gastrostomy status: Secondary | ICD-10-CM | POA: Insufficient documentation

## 2013-06-10 HISTORY — DX: Personal history of irradiation: Z92.3

## 2013-06-10 HISTORY — DX: Complete loss of teeth, unspecified cause, unspecified class: K08.109

## 2013-06-10 HISTORY — DX: Malignant neoplasm of esophagus, unspecified: C15.9

## 2013-06-10 HISTORY — DX: Personal history of antineoplastic chemotherapy: Z92.21

## 2013-06-10 HISTORY — DX: Anodontia: K00.0

## 2013-06-10 NOTE — Progress Notes (Addendum)
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 male who I originally met in 2013 for a locally advanced oral tongue cancer. I referred the patient to [facility] for surgery and he ultimately received induction Erlotinib followed by anterior glossectomy and bilateral selective neck dissections. This was followed by external beam radiotherapy with concurrent high dose cis-platinum. He received a total radiation dose of 60 Gray in 30 fractions and completed his adjuvant therapy  04/06/2012. His stage was ypT2 N2c M0. He has been in remission since completion of therapy in terms of his tongue cancer but a CT followup revealed a new  distal esophageal mass. He underwent esophagogastroduodenoscopy and endoscopic ultrasound by Dr. Margaretha Glassing in 05/09/2013 at [facility]. This revealed a T3 esophageal mass invading the adventitia. Biopsy revealed well-differentiated squamous cell carcinoma. According to the reports, the mass extended from 35 cm and the incisors through the GE junction at 40 cm in the distal aspect of the mass reach 42 cm from the incisors. He underwent a PET scan and I have been able to review these images which were provided to me on CD. The PET scan performed at [facility] on 05/26/2013 reveals diffuse thickening   of the distal esophagus to the proximal stomach with associated hypermetabolic activity as well as several hypermetabolic gastrohepatic lymph nodes. There is no evidence of distant metastatic disease. There is some FDG uptake in the head and  neck region which was indeterminate. This was followed by a CT scan on October 21 of the neck which showed stable postsurgical and postradiation change without evidence of tumor recurrence or adenopathy.  The patient had been taking Osmolite 6 cans per day through his PEG tube but his PEG tube was removed in April. Most recently he's been having as little as 3 cans a day by mouth. He says that recently he has cut down on Osmolite because he does not feel like taking in nutritional shakes. He has a dry mouth and he also has occasional regurgitation. The main reason is just lack of desire to drink the shakes. He says he is motivated to increase his intake since it is importantand feels that he is able to. He still smokes half a pack per day. He has 2-3 ETOH drinks daily. He says that liquids, especially water occasionally catch near his Adam's apple but eventually go down after a few episodes of her pain. He has midepigastric pain on occasion. He has socioeconomic stressors, limited social support but is accompanied by a neighbor today. Has Transportation issues. Of note he also reports lymphedema in his neck since completing his radiotherapy but has received physical therapy for this with some improvement. He is lost about 16 pounds over the past 6 months.   PREVIOUS RADIATION THERAPY: Yes as above  PAST MEDICAL HISTORY:  has a past medical history of Tobacco abuse; Leucocytosis (11/01/11); Dysphagia; Cancer of tongue (3/16/ 13); Seasonal allergies; S/P radiation therapy (02/24/12 - 04/06/12); Status post chemotherapy (02/24/12 -03/2012); Esophageal cancer (05/11/13); and Edentulous.    PAST SURGICAL HISTORY: Past Surgical History  Procedure Laterality Date  .  Biopsy tongue  11/12/11    Tongue - Squamous Cell Cancer  . Glossectomy  01/19/12    FAMILY HISTORY: family history includes Esophageal cancer in his father; Seizures in his mother.  SOCIAL HISTORY:  reports that he has been smoking Cigarettes.  He has  a 52.5 pack-year smoking history. He has never used smokeless tobacco. He reports that he drinks alcohol. He reports that he does not use illicit drugs.  ALLERGIES: Penicillins  MEDICATIONS:  Current Outpatient Prescriptions  Medication Sig Dispense Refill  . HYDROcodone-acetaminophen (HYCET) 7.5-325 mg/15 ml solution Take 15 mLs by mouth every 8 (eight) hours as needed for pain.      . Nutritional Supplements (FEEDING SUPPLEMENT, OSMOLITE 1.5 CAL,) LIQD Place 1,000 mLs into feeding tube continuous.       No current facility-administered medications for this encounter.    REVIEW OF SYSTEMS:  Notable for that above.   PHYSICAL EXAM:  height is 5\' 6"  (1.676 m) and weight is 112 lb 11.2 oz (51.12 kg). His temperature is 94.7 F (34.8 C). His blood pressure is 129/89 and his pulse is 130.   General: Alert and oriented, in no acute distress, muffled speech from tongue surgery HEENT: Head is normocephalic.  Extraocular movements are intact. Oropharynx - very dry mucous membranes. Status post reconstructive tongue surgery. No suspicious lesions or thrush. Neck: lymphedema especially in anterior cervical neck, skin intact, no palpable cervical or supraclavicular lymphadenopathy. Heart: Regular in rate and rhythm with no murmurs, rubs, or gallops. Chest: Clear to auscultation bilaterally, with no rhonchi, wheezes, or rales. Abdomen: Soft, nontender, nondistended, with no rigidity or guarding. Normoactive bowel sounds. PEG tube ostomy site healed post removal. Extremities: No cyanosis or edema. Lymphatics: No concerning lymphadenopathy. Skin: No concerning lesions. Musculoskeletal: symmetric strength and muscle tone throughout. Neurologic: Cranial nerves II through XII are grossly intact. No obvious focalities. Speech is fluent. Coordination is intact. Psychiatric: Judgment and insight are intact. Affect is appropriate.  ECOG = 1  LABORATORY DATA:  Lab Results  Component Value Date   WBC  11.9* 12/04/2011   HGB 18.1* 12/04/2011   HCT 54.8* 12/04/2011   MCV 88.4 12/04/2011   PLT 365 12/04/2011   CMP     Component Value Date/Time   NA 133* 12/04/2011 1505   K 3.9 12/04/2011 1505   CL 94* 12/04/2011 1505   CO2 28 12/04/2011 1505   GLUCOSE 91 12/04/2011 1505   BUN 10 12/04/2011 1505   CREATININE 0.54 12/04/2011 1505   CALCIUM 10.4 12/04/2011 1505   PROT 8.2 12/04/2011 1505   ALBUMIN 3.6 12/04/2011 1505   AST 14 12/04/2011 1505   ALT 8 12/04/2011 1505   ALKPHOS 58 12/04/2011 1505   BILITOT 0.2* 12/04/2011 1505   GFRNONAA >90 11/01/2011 0700   GFRAA >90 11/01/2011 0700       RADIOGRAPHY: as above    IMPRESSION/PLAN: This is a very pleasant 57 yo man with 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  Plan as follows  1) I referred him to nutrition to address weight loss  2) I referred him to social work due to socio economic stressors. Also, my nurse is working on transportion issues via [employer]  3)  The patient continues to use tobacco. Both cancers likely related to this. The patient was counseled to stop using tobacco and was offered pharmacotherapy and further counseling to help with this. The patient declined  pharmacotherapy and further counseling at this time.  4) Referral to medical oncology for concurrent chemotherapy  5)  We discussed the risks, benefits, and side effects (possibly including esophagitis, skin irritation, fatigue, nausea, diarrhea, injury to internal chest/abdomen organs) of radiotherapy. I recommend a 5 and a half week course with concurrent chemotherapy. There is a possibility of eventual resection for loco regional control after neoadjuvant ChRT but  he's not seen a C-T surgeon yet. He is scheduled to see Dr. Lenis Noon at [facility] on 11/7.   No guarantees of treatment were given. A consent form was signed and placed in the patient's medical record. The patient is  enthusiastic about proceeding with treatment. I look forward to participating in the patient's care. Simulation to occur early next week. IMRT may be needed to spare spinal cord, kidneys, bowel, lung, liver adequately but I think there is a good chance 3D conformal radiotherapy will be able to accomplish this.    I am scheduled to start maternity leave in early November, sooner if necessary.  I let the patient know that once I am on leave, one of my partners will continue their care. He is comfortable with this plan.  I spent 35 minutes face to face with the patient and more than 50% of that time was spent in counseling and/or coordination of care.    __________________________________________   Lonie Peak, MD

## 2013-06-13 ENCOUNTER — Other Ambulatory Visit: Payer: Self-pay | Admitting: *Deleted

## 2013-06-13 ENCOUNTER — Encounter: Payer: Self-pay | Admitting: Oncology

## 2013-06-13 ENCOUNTER — Ambulatory Visit (HOSPITAL_BASED_OUTPATIENT_CLINIC_OR_DEPARTMENT_OTHER): Payer: Medicaid Other | Admitting: Oncology

## 2013-06-13 ENCOUNTER — Ambulatory Visit: Payer: Medicaid Other

## 2013-06-13 ENCOUNTER — Telehealth: Payer: Self-pay | Admitting: Oncology

## 2013-06-13 ENCOUNTER — Telehealth: Payer: Self-pay | Admitting: *Deleted

## 2013-06-13 VITALS — BP 115/82 | HR 129 | Temp 97.7°F | Resp 18 | Ht 65.0 in | Wt 111.5 lb

## 2013-06-13 DIAGNOSIS — C159 Malignant neoplasm of esophagus, unspecified: Secondary | ICD-10-CM

## 2013-06-13 DIAGNOSIS — F17200 Nicotine dependence, unspecified, uncomplicated: Secondary | ICD-10-CM

## 2013-06-13 DIAGNOSIS — F1021 Alcohol dependence, in remission: Secondary | ICD-10-CM

## 2013-06-13 DIAGNOSIS — R911 Solitary pulmonary nodule: Secondary | ICD-10-CM

## 2013-06-13 DIAGNOSIS — C029 Malignant neoplasm of tongue, unspecified: Secondary | ICD-10-CM

## 2013-06-13 DIAGNOSIS — C155 Malignant neoplasm of lower third of esophagus: Secondary | ICD-10-CM

## 2013-06-13 MED ORDER — DEXAMETHASONE 2 MG PO TABS: 10.0000 mg | ORAL_TABLET | ORAL | Status: DC

## 2013-06-13 MED ORDER — PROMETHAZINE HCL 6.25 MG/5ML PO SOLN: 12.5000 mg | ORAL | Status: DC | PRN

## 2013-06-13 NOTE — Progress Notes (Signed)
Met with Henry Leonard and family. Explained role of nurse navigator. Educational information provided on esophageal cancer  Referral has already been made to dietician for diet education/assessment and he has an appointment scheduled. [facility] resources provided to patient, including SW service information. He was seen by Tamala Julian SW today to discuss transportation issues and resources.  Contact names and phone numbers were provided for entire [facility] team.  Teach back method was used. Will continue to follow as needed.

## 2013-06-13 NOTE — Progress Notes (Signed)
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 No dentition. Chronic dry mouth.

## 2013-06-13 NOTE — Addendum Note (Signed)
Encounter addended by: Delynn Flavin, RN on: 06/13/2013 10:02 AM<BR>     Documentation filed: Demographics Visit

## 2013-06-13 NOTE — Telephone Encounter (Signed)
Called Ms.OJennette Kettle, the driver for Mr. Lincks to inform her that [employer] will transport Mr. Rana Snare for his daily Radiation Therapy Treatment, but they cannot transport him for his simulation on tomorrow. They can start when he begins his daily treatments.   Will call Ms.O'Neal with start date once simulation complete and will also notify [employer].

## 2013-06-13 NOTE — Progress Notes (Signed)
Checked in new patient. With CA send to [facility] on card. No financial issue.

## 2013-06-13 NOTE — Telephone Encounter (Signed)
Received referral from Dr. Basilio Cairo on 06/10/13 for medical oncology.  Spoke with patient by phone and he can see the patient today at 3:00.

## 2013-06-13 NOTE — Progress Notes (Signed)
[facility] New Patient Consult   Referring MD: Johnathan Heskett 57 y.o.  December 03, 1955    Reason for Referral: Esophagus cancer     HPI: Mr. Henry Leonard was diagnosed with a locally advanced tongue cancer in 2013. He was treated at [facility] with induction Erlotinib followed by surgery and adjuvant chemotherapy/radiation. The chemotherapy consisted of high-dose cisplatin. He reports receiving 2 cycles.  He reports liquid dysphasia for the past few months. He underwent an upper endoscopy and endoscopic ultrasound at [facility] on 05/09/2013. This revealed a T3 esophagus mass and a biopsy confirmed well-differentiated squamous cell carcinoma.  Staging CT scans at [facility] on 05/26/2013 revealed an enhancing solid mass centered at the distal esophagus extending in the proximal stomach. The liver was within normal limits. Necrotic upper abdominal adenopathy was noted with an index lesion measuring 2.4 x 1.1 cm. An unchanged spiculated nodule was noted in the left upper lobe compared to a CT from 04/06/2013.  A PET scan on 05/26/2013 revealed diffuse thickening of the distal esophagus extending into the proximal stomach with associated hypermetabolic activity. Multiple hypermetabolic gastrohepatic nodes were noted. No evidence of distant metastatic disease. Indeterminate focal FDG uptake in the left neck adjacent to a surgical clip. Low level uptake within a spiculated nodule in the left lower lobe.  A CT of the neck on 06/07/2013 revealed stable postsurgical and postradiation change without evidence of tumor recurrence or adenopathy.  He was referred to Dr. Basilio Cairo and is scheduled to undergo radiation simulation on 06/14/2013. He is scheduled for a surgical appointment with Dr. Lenis Noon.  Past Medical History  Diagnosis Date  . Tobacco abuse   . Leucocytosis 11/01/11      . Cancer of tongue- ypT2,N2cM0 3/16/ 13    Associated Neck Nodes  . Seasonal allergies   .  S/P radiation therapy 02/24/12 - 04/06/12    Surgical bed and bilateral neck / 60 Gy / 30 Fractions VMAT Plan  . Status post chemotherapy 02/24/12 -03/2012     Cisplatin Concurrently with Radiation Therapy  . Esophageal cancer 05/11/13    T3 Squamous Cell Carcinoma,  Invasive Well differentiated  . Edentulous     .  Tachycardia  Past Surgical History  Procedure Laterality Date  . Biopsy tongue  11/12/11    Tongue - Squamous Cell Cancer  . Glossectomy, neck dissection, forearm skin graft   01/19/12    .    Tracheostomy                                                                                                                12/19/2011   .    Gastrostomy tube placement  12/19/2011  Family History  Problem Relation Age of Onset  . Esophageal cancer Father  63s   . Seizures Mother    .    Stomach cancer                                                maternal uncle    Current outpatient prescriptions:HYDROcodone-acetaminophen (HYCET) 7.5-325 mg/15 ml solution, Take 10-53ml every 8 hours prn., Disp: , Rfl: ;  Nutritional Supplements (OSMOLITE) LIQD, Take 6 Cans by mouth., Disp: , Rfl: ;  dexamethasone (DECADRON) 2 MG tablet, Take 5 tablets (10 mg total) by mouth as directed. Take #5 (10 mg) at bedtime night before 1st chemo and at 6 am day of 1st chemo, Disp: 10 tablet, Rfl: 0 Promethazine HCl 6.25 MG/5ML SOLN, Take 10 mLs (12.5 mg total) by mouth every 4 (four) hours as needed (nausea0). Call if nausea not controlled, Disp: 560 mL, Rfl: 0  Allergies:  Allergies  Allergen Reactions  . Penicillins Rash    Social History: He lives in [place]. He smokes cigarettes. He has a history of heavy alcohol use in the past. No transfusion history. No risk factor for HIV or hepatitis.   ROS:   Positives include: Hearing loss following cisplatin chemotherapy-now improved, loss of taste following radiation-partially  improved, dry mouth following radiation, liquid dysphasia, right hand numbness following the skin graft surgery  A complete ROS was otherwise negative.  Physical Exam:  Blood pressure 115/82, pulse 129, temperature 97.7 F (36.5 C), temperature source Oral, resp. rate 18, height 5\' 5"  (1.651 m), weight 111 lb 8 oz (50.576 kg), SpO2 100.00%.  HEENT: Reconstructed tongue, dry mouth, oropharynx without visible mass. Radiation change at the bilateral upper neck and submental area without a discrete mass. Lungs: Clear bilaterally Cardiac: Regular rate and rhythm Abdomen: No hepatosplenomegaly, nontender, no mass GU: Testes without mass  Vascular: No leg edema Lymph nodes: No cervical, supraclavicular, axillary, or inguinal nodes Neurologic: Alert and oriented, the motor exam appears intact in the upper and lower extremity Skin: No rash, scar at the right forearm Musculoskeletal: No spine tenderness   LAB:  CBC and chemistry panel to be obtained prior to chemotherapy  Radiology: As per history present illness    Assessment/Plan:   1. 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 Spiculated left lower lobe nodule with low level FDG activity on the staging PET scan 05/26/2013  3. Tongue cancer 2013,ypT2N2cM0, status post Erlotonib, glossectomy/neck dissection, and adjuvant cisplatin/radiation in 2013  4. Ongoing tobacco use  5. History of alcohol use   Disposition:   Henry Leonard has been diagnosed with locally advanced squamous cell carcinoma the esophagus. I discussed treatment options with him today. He appears to have locally advanced disease. I recommend concurrent chemotherapy and radiation. He is scheduled for radiation simulation on 06/14/2013 and will begin  radiation on 06/22/2013. I recommend concurrent weekly Taxol/carboplatin chemotherapy.  We reviewed potential toxicities associated with the Taxol/carboplatin regimen including the chance for nausea/vomiting, mucositis, alopecia, and hematologic toxicity. We discussed the bone pain, neuropathy, and allergic reaction seen with Taxol. We discussed the potential for an allergic reaction with carboplatin. He will attend a chemotherapy teaching class.  He is scheduled to see Dr. Lenis Noon to discuss the indication for surgery at the completion of chemotherapy/radiation. I doubt he will be a surgical candidate based on his previous treatment, history of tobacco use, and the locally advanced disease.  He will meet with the cancer Center nutritionist and we will watch his weight closely throughout the course of therapy. We discussed the potential for developing odynophagia with treatment.  Approximately 60 minutes were spent with patient today. The majority of the time was used for counseling and coordination of care.  Henry Leonard 06/13/2013, 5:21 PM

## 2013-06-13 NOTE — Telephone Encounter (Signed)
Pt schedule to see Dr. Truett Perna 10/27 @ 2:30.

## 2013-06-14 ENCOUNTER — Other Ambulatory Visit: Payer: Self-pay | Admitting: *Deleted

## 2013-06-14 ENCOUNTER — Ambulatory Visit
Admission: RE | Admit: 2013-06-14 | Discharge: 2013-06-14 | Disposition: A | Discharge status: Home / Self Care | Payer: Medicaid Other | Source: Ambulatory Visit | Attending: Radiation Oncology | Admitting: Radiation Oncology

## 2013-06-14 VITALS — BP 104/80 | HR 124 | Temp 97.4°F | Ht 65.0 in | Wt 112.8 lb

## 2013-06-14 DIAGNOSIS — E860 Dehydration: Secondary | ICD-10-CM | POA: Insufficient documentation

## 2013-06-14 DIAGNOSIS — R000 Tachycardia, unspecified: Secondary | ICD-10-CM | POA: Insufficient documentation

## 2013-06-14 DIAGNOSIS — R110 Nausea: Secondary | ICD-10-CM | POA: Insufficient documentation

## 2013-06-14 DIAGNOSIS — R141 Gas pain: Secondary | ICD-10-CM | POA: Insufficient documentation

## 2013-06-14 DIAGNOSIS — Z510 Encounter for antineoplastic radiation therapy: Secondary | ICD-10-CM | POA: Insufficient documentation

## 2013-06-14 DIAGNOSIS — R142 Eructation: Secondary | ICD-10-CM | POA: Insufficient documentation

## 2013-06-14 DIAGNOSIS — R42 Dizziness and giddiness: Secondary | ICD-10-CM | POA: Insufficient documentation

## 2013-06-14 DIAGNOSIS — C159 Malignant neoplasm of esophagus, unspecified: Secondary | ICD-10-CM | POA: Insufficient documentation

## 2013-06-14 DIAGNOSIS — C029 Malignant neoplasm of tongue, unspecified: Secondary | ICD-10-CM | POA: Insufficient documentation

## 2013-06-14 DIAGNOSIS — C155 Malignant neoplasm of lower third of esophagus: Secondary | ICD-10-CM

## 2013-06-14 DIAGNOSIS — R1110 Vomiting, unspecified: Secondary | ICD-10-CM | POA: Insufficient documentation

## 2013-06-14 DIAGNOSIS — R5381 Other malaise: Secondary | ICD-10-CM | POA: Insufficient documentation

## 2013-06-14 NOTE — Progress Notes (Signed)
Henry Leonard here for simulation today.  IV access attempted x 4 without adequate blood return.  1st attempt, Sonda Rumble, RN in the Left arm ventral, lateral region, 2nd attempt, Reginia Forts, RN in the left arm. 3rd and 4th attempt by Ferd Glassing, RN in the lateral ventral surface of the left arm, and in the dorsal surface of the left hand.  Unable to use right arm due to prior skin grafting.  Per, Dr. Basilio Cairo simulation will proceed without contrast today.  Simulation started at 11;45am.  Henry Leonard was very tolerant of our attempts and stated disappointment with the inability to establish IV access.  Will contact Dr. Kalman Drape nurse to inform her of the difficulty since Henry Leonard will start chemotherapy in the future.  He does not have port access.

## 2013-06-14 NOTE — Progress Notes (Signed)
MD made aware of difficulty with venous access today. Gave order for PAC to be placed per radiology. Orders in EPIC.

## 2013-06-14 NOTE — Progress Notes (Addendum)
  Radiation Oncology         [phone] ________________________________  Name: Henry Leonard MRN: [id number]  Date: 06/14/2013  DOB: 1956-06-29  SIMULATION AND TREATMENT PLANNING NOTE  outpatient  DIAGNOSIS:  Esophageal Cancer  NARRATIVE:  The patient was brought to the CT Simulation planning suite.  Identity was confirmed.  All relevant records and images related to the planned course of therapy were reviewed.  The patient freely provided informed written consent to proceed with treatment after reviewing the details related to the planned course of therapy. The consent form was witnessed and verified by the simulation staff.    Then, the patient was set-up in a stable reproducible  supine position for radiation therapy.  CT images were obtained.  Surface markings were placed.  The CT images were loaded into the planning software.    TREATMENT PLANNING NOTE: Treatment planning then occurred.  The radiation prescription was entered and confirmed.    A total of 8 medically necessary complex treatment devices were fabricated and supervised by me. (these are fields with MLCs to protect the structures below).  3D simulation was ordered to spare cord, kidneys, bowel, lungs, heart. I requested a DVH of these structures.  The patient will receive 45 Gy in 25 fractions with a 4 field plan, this will be followed by a 4 field boost of 5.4 Gy in 3 fractions to total 50.4 Gy in 28 fractions.  Special Treatment Procedure Note: The patient will be receiving chemotherapy concurrently. Chemotherapy heightens the risk of side effects. I have considered this during the patient's treatment planning process and will monitor the patient accordingly for side effects on a weekly basis. Concurrent chemotherapy increases the complexity of this patient's treatment and therefore this constitutes a special treatment procedure.  The patient has a prior history of radiotherapy. I spoke with Dr. Gwendalyn Ege at [facility] to  establish that his prior fields for his tongue cancer did not go beyond level IV (in other words, not close to the carina which is at the superior aspect of my current fields. )  -----------------------------------  Lonie Peak, MD

## 2013-06-16 ENCOUNTER — Telehealth: Payer: Self-pay | Admitting: Oncology

## 2013-06-16 ENCOUNTER — Encounter: Payer: Self-pay | Admitting: Nutrition

## 2013-06-16 ENCOUNTER — Other Ambulatory Visit: Payer: Self-pay | Admitting: Radiology

## 2013-06-16 NOTE — Telephone Encounter (Signed)
pt called to r/s nut appt to nxt week same day as radiation...done

## 2013-06-17 ENCOUNTER — Inpatient Hospital Stay
Admission: RE | Admit: 2013-06-17 | Discharge: 2013-06-17 | Disposition: A | Discharge status: Home / Self Care | Payer: Self-pay | Source: Ambulatory Visit | Attending: Radiation Oncology | Admitting: Radiation Oncology

## 2013-06-17 ENCOUNTER — Telehealth: Payer: Self-pay | Admitting: *Deleted

## 2013-06-17 ENCOUNTER — Telehealth: Payer: Self-pay | Admitting: Oncology

## 2013-06-17 ENCOUNTER — Encounter (HOSPITAL_COMMUNITY): Payer: Self-pay | Admitting: Pharmacy Technician

## 2013-06-17 ENCOUNTER — Other Ambulatory Visit: Payer: Self-pay | Admitting: Radiation Oncology

## 2013-06-17 DIAGNOSIS — C159 Malignant neoplasm of esophagus, unspecified: Secondary | ICD-10-CM

## 2013-06-17 NOTE — Telephone Encounter (Signed)
Per staff message and POF I have scheduled appts.  JMW  

## 2013-06-17 NOTE — Telephone Encounter (Signed)
Gave pt appt for lab,Md and chemo class for November 2014, emailed Marcelino Duster regarding chemo, advised pt to get appt calendar

## 2013-06-20 ENCOUNTER — Other Ambulatory Visit: Payer: Self-pay

## 2013-06-20 ENCOUNTER — Other Ambulatory Visit: Payer: Self-pay | Admitting: Radiology

## 2013-06-20 NOTE — Addendum Note (Signed)
Encounter addended by: Lonie Peak, MD on: 06/20/2013  7:10 PM<BR>     Documentation filed: Notes Section

## 2013-06-21 ENCOUNTER — Ambulatory Visit (HOSPITAL_COMMUNITY)
Admission: RE | Admit: 2013-06-21 | Discharge: 2013-06-21 | Disposition: A | Discharge status: Home / Self Care | Payer: Medicaid Other | Source: Ambulatory Visit | Attending: Oncology | Admitting: Oncology

## 2013-06-21 ENCOUNTER — Other Ambulatory Visit: Payer: Medicaid Other

## 2013-06-21 ENCOUNTER — Other Ambulatory Visit: Payer: Self-pay | Admitting: Oncology

## 2013-06-21 ENCOUNTER — Encounter: Payer: Self-pay | Admitting: *Deleted

## 2013-06-21 ENCOUNTER — Encounter (HOSPITAL_COMMUNITY): Payer: Self-pay

## 2013-06-21 ENCOUNTER — Other Ambulatory Visit: Payer: Self-pay | Admitting: *Deleted

## 2013-06-21 DIAGNOSIS — Z85810 Personal history of malignant neoplasm of tongue: Secondary | ICD-10-CM | POA: Insufficient documentation

## 2013-06-21 DIAGNOSIS — C155 Malignant neoplasm of lower third of esophagus: Secondary | ICD-10-CM

## 2013-06-21 DIAGNOSIS — F17200 Nicotine dependence, unspecified, uncomplicated: Secondary | ICD-10-CM | POA: Insufficient documentation

## 2013-06-21 LAB — CBC
HCT: 32.2 % — ABNORMAL LOW (ref 39.0–52.0)
Platelets: 521 10*3/uL — ABNORMAL HIGH (ref 150–400)
RBC: 3.7 MIL/uL — ABNORMAL LOW (ref 4.22–5.81)
RDW: 12.9 % (ref 11.5–15.5)
WBC: 13.6 10*3/uL — ABNORMAL HIGH (ref 4.0–10.5)

## 2013-06-21 LAB — APTT: aPTT: 42 seconds — ABNORMAL HIGH (ref 24–37)

## 2013-06-21 LAB — PROTIME-INR
INR: 1.09 (ref 0.00–1.49)
Prothrombin Time: 13.9 seconds (ref 11.6–15.2)

## 2013-06-21 MED ORDER — SODIUM CHLORIDE 0.9 % IV SOLN
INTRAVENOUS | Status: DC
  Administered 2013-06-21: 12:00:00 via INTRAVENOUS

## 2013-06-21 MED ORDER — MIDAZOLAM HCL 2 MG/2ML IJ SOLN
INTRAMUSCULAR | Status: AC
  Filled 2013-06-21: qty 4

## 2013-06-21 MED ORDER — HEPARIN SOD (PORK) LOCK FLUSH 100 UNIT/ML IV SOLN
500.0000 [IU] | Freq: Once | INTRAVENOUS | Status: AC
  Administered 2013-06-21: 500 [IU] via INTRAVENOUS

## 2013-06-21 MED ORDER — FENTANYL CITRATE 0.05 MG/ML IJ SOLN
INTRAMUSCULAR | Status: AC | PRN
  Administered 2013-06-21 (×2): 50 ug via INTRAVENOUS

## 2013-06-21 MED ORDER — FENTANYL CITRATE 0.05 MG/ML IJ SOLN
INTRAMUSCULAR | Status: AC
  Filled 2013-06-21: qty 4

## 2013-06-21 MED ORDER — LIDOCAINE-PRILOCAINE 2.5-2.5 % EX CREA: TOPICAL_CREAM | CUTANEOUS | Status: DC

## 2013-06-21 MED ORDER — MIDAZOLAM HCL 2 MG/2ML IJ SOLN
INTRAMUSCULAR | Status: AC | PRN
  Administered 2013-06-21 (×2): 1 mg via INTRAVENOUS

## 2013-06-21 MED ORDER — VANCOMYCIN HCL IN DEXTROSE 1-5 GM/200ML-% IV SOLN
1000.0000 mg | INTRAVENOUS | Status: AC
  Administered 2013-06-21: 1000 mg via INTRAVENOUS
  Filled 2013-06-21: qty 200

## 2013-06-21 NOTE — Procedures (Signed)
Successful placement of right IJ approach port-a-cath with tip at the superior caval atrial junction. The catheter is ready for immediate use. No immediate post procedural complications. 

## 2013-06-21 NOTE — Telephone Encounter (Signed)
Request from Chemo Education RN requesting EMLA cream to be called in. Rx sent to pharmacy. Called pt, instructed him to leave dressing intact for treatment 11/5. (S/P port placement on 06/21/13.) Will use ice if needed to access port for treatment. Pt voiced understanding.

## 2013-06-21 NOTE — H&P (Signed)
HPI: Henry Leonard is an 57 y.o. male with esophageal cancer who is to get radiation therapy and chemotherapy. He actually had tongue cancer last year and had radical glossectomy with reconstruction and radiation. He's recovered from that but now has distal esophageal cancer. He is referred for [facility] placement. PMHx and meds reviewed. Feels well, no recent illness, fevers, chills.  Past Medical History:  Past Medical History  Diagnosis Date  . Tobacco abuse   . Leucocytosis 11/01/11  . Dysphagia   . Cancer of tongue 3/16/ 13    Associated Neck Nodes  . Seasonal allergies   . S/P radiation therapy 02/24/12 - 04/06/12    Surgical bed and bilateral neck / 60 Gy / 30 Fractions VMAT Plan  . Status post chemotherapy 02/24/12 -03/2012     Cisplatin Concurrently with Radiation Therapy  . Esophageal cancer 05/11/13    T3 Squamous Cell Carcinoma,  Invasive Well differentiated  . Edentulous     Past Surgical History:  Past Surgical History  Procedure Laterality Date  . Biopsy tongue  11/12/11    Tongue - Squamous Cell Cancer  . Glossectomy  01/19/12  . Multiple tooth extractions  11/2011  . Feeding tube insertion and removal  11/2011 inserted removed 10/2012  . Tracheostomy  11/2011  . Tracheal closed stent removal w/ mlb  11/20/2102    Family History:  Family History  Problem Relation Age of Onset  . Esophageal cancer Father   . Seizures Mother     Social History:  reports that he has been smoking Cigarettes.  He has a 52.5 pack-year smoking history. He has never used smokeless tobacco. He reports that he drinks alcohol. He reports that he does not use illicit drugs.  Allergies:  Allergies  Allergen Reactions  . Penicillins Rash    Medications:   Medication List    ASK your doctor about these medications       CLEAR EYES OP  Place 2 drops into both eyes daily as needed (For dry or irritated eyes.).     dexamethasone 2 MG tablet  Commonly known as:  DECADRON  Take 5 tablets (10  mg total) by mouth as directed. Take #5 (10 mg) at bedtime night before 1st chemo and at 6 am day of 1st chemo     HYDROcodone-acetaminophen 7.5-325 mg/15 ml solution  Commonly known as:  HYCET  Take 10-15 mLs by mouth every 8 (eight) hours as needed for pain.     OSMOLITE Liqd  Take 237 mLs by mouth See admin instructions. He drinks 3-4 cans per day.     promethazine 6.25 MG/5ML syrup  Commonly known as:  PHENERGAN  Take 12.5 mg by mouth every 4 (four) hours as needed for nausea (Call if nausea not controlled.).        Please HPI for pertinent positives, otherwise complete 10 system ROS negative.  Physical Exam: BP 114/82  Pulse 122  Temp(Src) 97.5 F (36.4 C) (Axillary)  Resp 22  Ht 5\' 6"  (1.676 m)  Wt 105 lb (47.628 kg)  BMI 16.96 kg/m2  SpO2 98% Body mass index is 16.96 kg/(m^2).   General Appearance:  Alert, cooperative, no distress, appears stated age  Head:  Normocephalic, without obvious abnormality, atraumatic  ENT: Surgical changes noted, but able to visualize palate and uvula.  Neck: Post surgical and radiation changes noted  Lungs:   Clear to auscultation bilaterally, no w/r/r,   Chest Wall:  No tenderness or deformity  Heart:  Regular rate and rhythm, S1, S2 normal, no murmur, rub or gallop.  Neurologic: Normal affect, no gross deficits.   Results for orders placed during the hospital encounter of 06/21/13 (from the past 48 hour(s))  APTT     Status: Abnormal   Collection Time    06/21/13 11:30 AM      Result Value Range   aPTT 42 (*) 24 - 37 seconds   Comment:            IF BASELINE aPTT IS ELEVATED,     SUGGEST PATIENT RISK ASSESSMENT     BE USED TO DETERMINE APPROPRIATE     ANTICOAGULANT THERAPY.  CBC     Status: Abnormal   Collection Time    06/21/13 11:30 AM      Result Value Range   WBC 13.6 (*) 4.0 - 10.5 K/uL   RBC 3.70 (*) 4.22 - 5.81 MIL/uL   Hemoglobin 10.4 (*) 13.0 - 17.0 g/dL   HCT 16.1 (*) 09.6 - 04.5 %   MCV 87.0  78.0 - 100.0 fL    MCH 28.1  26.0 - 34.0 pg   MCHC 32.3  30.0 - 36.0 g/dL   RDW 40.9  81.1 - 91.4 %   Platelets 521 (*) 150 - 400 K/uL  PROTIME-INR     Status: None   Collection Time    06/21/13 11:30 AM      Result Value Range   Prothrombin Time 13.9  11.6 - 15.2 seconds   INR 1.09  0.00 - 1.49   No results found.  Assessment/Plan Distal esophageal cancer For Port placement. Discussed procedure, risks, complications, use of sedation. Labs reviewed, WBC slightly elevated, likely from Decadron use prior to starting chemo. Consent signed in chart  Brayton El PA-C 06/21/2013, 1:02 PM

## 2013-06-22 ENCOUNTER — Ambulatory Visit
Admission: RE | Admit: 2013-06-22 | Discharge: 2013-06-22 | Disposition: A | Discharge status: Home / Self Care | Payer: Medicaid Other | Source: Ambulatory Visit | Attending: Radiation Oncology | Admitting: Radiation Oncology

## 2013-06-22 ENCOUNTER — Other Ambulatory Visit: Payer: Self-pay | Admitting: Oncology

## 2013-06-22 ENCOUNTER — Other Ambulatory Visit: Payer: Self-pay | Admitting: *Deleted

## 2013-06-22 ENCOUNTER — Ambulatory Visit (HOSPITAL_BASED_OUTPATIENT_CLINIC_OR_DEPARTMENT_OTHER): Payer: Medicaid Other

## 2013-06-22 ENCOUNTER — Telehealth: Payer: Self-pay | Admitting: Oncology

## 2013-06-22 ENCOUNTER — Ambulatory Visit (HOSPITAL_BASED_OUTPATIENT_CLINIC_OR_DEPARTMENT_OTHER): Payer: Medicaid Other | Admitting: Lab

## 2013-06-22 VITALS — BP 133/86 | HR 95 | Temp 97.9°F | Resp 20

## 2013-06-22 DIAGNOSIS — C155 Malignant neoplasm of lower third of esophagus: Secondary | ICD-10-CM

## 2013-06-22 DIAGNOSIS — Z5111 Encounter for antineoplastic chemotherapy: Secondary | ICD-10-CM

## 2013-06-22 DIAGNOSIS — C023 Malignant neoplasm of anterior two-thirds of tongue, part unspecified: Secondary | ICD-10-CM

## 2013-06-22 DIAGNOSIS — Z5112 Encounter for antineoplastic immunotherapy: Secondary | ICD-10-CM

## 2013-06-22 LAB — CBC WITH DIFFERENTIAL/PLATELET
BASO%: 0.3 % (ref 0.0–2.0)
EOS%: 0.1 % (ref 0.0–7.0)
HCT: 29.5 % — ABNORMAL LOW (ref 38.4–49.9)
LYMPH%: 3.2 % — ABNORMAL LOW (ref 14.0–49.0)
MCH: 28 pg (ref 27.2–33.4)
MCHC: 33.1 g/dL (ref 32.0–36.0)
MCV: 84.8 fL (ref 79.3–98.0)
MONO%: 1.3 % (ref 0.0–14.0)
NEUT%: 95.1 % — ABNORMAL HIGH (ref 39.0–75.0)
Platelets: 455 10*3/uL — ABNORMAL HIGH (ref 140–400)
RDW: 13 % (ref 11.0–14.6)

## 2013-06-22 LAB — COMPREHENSIVE METABOLIC PANEL (CC13)
ALT: 6 U/L (ref 0–55)
Albumin: 3.6 g/dL (ref 3.5–5.0)
Alkaline Phosphatase: 67 U/L (ref 40–150)
Potassium: 4.2 mEq/L (ref 3.5–5.1)
Sodium: 139 mEq/L (ref 136–145)
Total Bilirubin: 0.3 mg/dL (ref 0.20–1.20)
Total Protein: 7.6 g/dL (ref 6.4–8.3)

## 2013-06-22 MED ORDER — SODIUM CHLORIDE 0.9 % IV SOLN
236.0000 mg | Freq: Once | INTRAVENOUS | Status: AC
  Administered 2013-06-22: 240 mg via INTRAVENOUS
  Filled 2013-06-22: qty 24

## 2013-06-22 MED ORDER — SODIUM CHLORIDE 0.9 % IV SOLN
Freq: Once | INTRAVENOUS | Status: AC
  Administered 2013-06-22: 14:00:00 via INTRAVENOUS

## 2013-06-22 MED ORDER — FAMOTIDINE IN NACL 20-0.9 MG/50ML-% IV SOLN
20.0000 mg | Freq: Once | INTRAVENOUS | Status: AC
  Administered 2013-06-22: 20 mg via INTRAVENOUS

## 2013-06-22 MED ORDER — HEPARIN SOD (PORK) LOCK FLUSH 100 UNIT/ML IV SOLN
500.0000 [IU] | Freq: Once | INTRAVENOUS | Status: AC | PRN
  Administered 2013-06-22: 500 [IU]
  Filled 2013-06-22: qty 5

## 2013-06-22 MED ORDER — DIPHENHYDRAMINE HCL 50 MG/ML IJ SOLN
INTRAMUSCULAR | Status: AC
  Filled 2013-06-22: qty 1

## 2013-06-22 MED ORDER — ONDANSETRON 16 MG/50ML IVPB (CHCC)
16.0000 mg | Freq: Once | INTRAVENOUS | Status: AC
  Administered 2013-06-22: 16 mg via INTRAVENOUS

## 2013-06-22 MED ORDER — DIPHENHYDRAMINE HCL 50 MG/ML IJ SOLN
25.0000 mg | Freq: Once | INTRAMUSCULAR | Status: AC
  Administered 2013-06-22: 25 mg via INTRAVENOUS

## 2013-06-22 MED ORDER — SODIUM CHLORIDE 0.9 % IJ SOLN
10.0000 mL | INTRAMUSCULAR | Status: DC | PRN
  Administered 2013-06-22: 10 mL
  Filled 2013-06-22: qty 10

## 2013-06-22 MED ORDER — ONDANSETRON 16 MG/50ML IVPB (CHCC)
INTRAVENOUS | Status: AC
  Filled 2013-06-22: qty 16

## 2013-06-22 MED ORDER — FAMOTIDINE IN NACL 20-0.9 MG/50ML-% IV SOLN
INTRAVENOUS | Status: AC
  Filled 2013-06-22: qty 50

## 2013-06-22 MED ORDER — DEXAMETHASONE SODIUM PHOSPHATE 20 MG/5ML IJ SOLN
INTRAMUSCULAR | Status: AC
  Filled 2013-06-22: qty 5

## 2013-06-22 MED ORDER — DEXAMETHASONE SODIUM PHOSPHATE 20 MG/5ML IJ SOLN
20.0000 mg | Freq: Once | INTRAMUSCULAR | Status: AC
  Administered 2013-06-22: 20 mg via INTRAVENOUS

## 2013-06-22 MED ORDER — PACLITAXEL CHEMO INJECTION 300 MG/50ML
50.0000 mg/m2 | Freq: Once | INTRAVENOUS | Status: AC
  Administered 2013-06-22: 78 mg via INTRAVENOUS
  Filled 2013-06-22: qty 13

## 2013-06-22 NOTE — Telephone Encounter (Signed)
Talked to pt and  he has calendar appt for November 2014, lab,md and chemo

## 2013-06-22 NOTE — Patient Instructions (Signed)
[facility] Discharge Instructions for Patients Receiving Chemotherapy  Today you received the following chemotherapy agents: Taxol and Carboplatin.  To help prevent nausea and vomiting after your treatment, we encourage you to take your nausea medication as prescribed.   If you develop nausea and vomiting that is not controlled by your nausea medication, call the clinic.   BELOW ARE SYMPTOMS THAT SHOULD BE REPORTED IMMEDIATELY:  *FEVER GREATER THAN 100.5 F  *CHILLS WITH OR WITHOUT FEVER  NAUSEA AND VOMITING THAT IS NOT CONTROLLED WITH YOUR NAUSEA MEDICATION  *UNUSUAL SHORTNESS OF BREATH  *UNUSUAL BRUISING OR BLEEDING  TENDERNESS IN MOUTH AND THROAT WITH OR WITHOUT PRESENCE OF ULCERS  *URINARY PROBLEMS  *BOWEL PROBLEMS  UNUSUAL RASH Items with * indicate a potential emergency and should be followed up as soon as possible.  Feel free to call the clinic you have any questions or concerns. The clinic phone number is [phone].    

## 2013-06-22 NOTE — Progress Notes (Signed)
Simulation Verification Note Outpatient  The patient was brought to the treatment unit and placed in the planned treatment position. The clinical setup was verified. Then port films were obtained and uploaded to the radiation oncology medical record software.  The treatment beams were carefully compared against the planned radiation fields. The position location and shape of the radiation fields was reviewed. They targeted volume of tissue appears to be appropriately covered by the radiation beams. Organs at risk appear to be excluded as planned.  Based on my personal review, I approved the simulation verification. The patient's treatment will proceed as planned.  -----------------------------------  Henry Wasco, MD   

## 2013-06-23 ENCOUNTER — Ambulatory Visit: Payer: Medicaid Other | Admitting: Nutrition

## 2013-06-23 ENCOUNTER — Other Ambulatory Visit (HOSPITAL_BASED_OUTPATIENT_CLINIC_OR_DEPARTMENT_OTHER): Payer: Medicaid Other | Admitting: Lab

## 2013-06-23 ENCOUNTER — Encounter: Payer: Self-pay | Admitting: *Deleted

## 2013-06-23 ENCOUNTER — Ambulatory Visit
Admission: RE | Admit: 2013-06-23 | Discharge: 2013-06-23 | Disposition: A | Discharge status: Home / Self Care | Payer: Medicaid Other | Source: Ambulatory Visit | Attending: Radiation Oncology | Admitting: Radiation Oncology

## 2013-06-23 DIAGNOSIS — C155 Malignant neoplasm of lower third of esophagus: Secondary | ICD-10-CM

## 2013-06-23 LAB — COMPREHENSIVE METABOLIC PANEL (CC13)
ALT: 8 U/L (ref 0–55)
AST: 17 U/L (ref 5–34)
Albumin: 3.7 g/dL (ref 3.5–5.0)
Calcium: 10.6 mg/dL — ABNORMAL HIGH (ref 8.4–10.4)
Chloride: 104 mEq/L (ref 98–109)
Creatinine: 0.7 mg/dL (ref 0.7–1.3)
Potassium: 4.1 mEq/L (ref 3.5–5.1)
Sodium: 140 mEq/L (ref 136–145)
Total Bilirubin: 0.36 mg/dL (ref 0.20–1.20)

## 2013-06-23 LAB — CBC WITH DIFFERENTIAL/PLATELET
BASO%: 0 % (ref 0.0–2.0)
Basophils Absolute: 0 10*3/uL (ref 0.0–0.1)
EOS%: 0 % (ref 0.0–7.0)
HCT: 33.8 % — ABNORMAL LOW (ref 38.4–49.9)
MCH: 27.6 pg (ref 27.2–33.4)
MCHC: 32 g/dL (ref 32.0–36.0)
MCV: 86.4 fL (ref 79.3–98.0)
NEUT%: 92 % — ABNORMAL HIGH (ref 39.0–75.0)
RBC: 3.91 10*6/uL — ABNORMAL LOW (ref 4.20–5.82)
RDW: 12.9 % (ref 11.0–14.6)
lymph#: 0.7 10*3/uL — ABNORMAL LOW (ref 0.9–3.3)

## 2013-06-23 LAB — CEA: CEA: 1.5 ng/mL (ref 0.0–5.0)

## 2013-06-23 NOTE — Progress Notes (Signed)
Late Entry:  Provided education with Mr. Henry Leonard reagrding management of any side-effects related to radiation therapy to his esophagus: pain, nausea or diarrhea, fatigue, and potential tanning,redness in the upper abdominal region, and associated weight loss.   He was given the Radiation Therapy and You booklet with the appropriate pages marked.  He stated understanding.  Teach Back Method.

## 2013-06-23 NOTE — Progress Notes (Signed)
Patient is a 57 year old male diagnosed with esophageal cancer.  He is a patient of Dr. Truett Perna.  Past medical history includes tobacco, dysphasia, tongue cancer, allergies, chemoradiation therapy, alcohol intake and total dental extractions.  Medications include Decadron and Phenergan.  Labs were reviewed.  Height: 65 inches. Weight: 105 pounds November 4. Usual body weight: 130 pounds April, 2013. BMI: 16.96. (underweight)  Patient reports that he no longer has a feeding tube.  Patient has dysphasia and is unable to swallow solids, but can drink Ensure Plus as well as Osmolite 1.5.  He receives enteral formula from somewhere in [place].  He could not tell me where.  He has no taste so the unflavored formula is acceptable to him.  Patient reports he can only tolerate 2-3 cans daily.  He understands this is inadequate.  He states that he has no control over whether "it comes back up or not".  Patient reports there are no plans for feeding tube placement.  He is hoping to increase ability to swallow liquids.  Estimated nutrition needs: 1600-1800 calories, 75-85 g protein, 1.8 L fluid.  Nutrition diagnosis: Unintended weight loss related to diagnosis of esophageal cancer and dysphasia as evidenced by 19% weight loss from usual body weight.  Patient meets criteria for severe malnutrition in the context of chronic illness secondary to severe depletion of both fat and muscle stores on physical exam.  Patient also has less than 75% of estimated energy requirements for greater than one month.  Intervention: Patient educated to increase Ensure Plus or Osmolite 1.5 to goal rate of 5 cans daily.  This will provide 1775 calories, 75 g protein, 905 mL free water.  He will work towards an initial goal of 4 cans daily to start with.  Patient educated to use nausea medications as prescribed by physician.    Monitoring, evaluation, goals: Patient will tolerate increasing oral nutrition supplements to 5  cans daily, to provide 100% of estimated nutrition needs to promote weight gain.  Next visit: Wednesday, November 12, during chemotherapy.

## 2013-06-24 ENCOUNTER — Ambulatory Visit
Admission: RE | Admit: 2013-06-24 | Discharge: 2013-06-24 | Disposition: A | Discharge status: Home / Self Care | Payer: Medicaid Other | Source: Ambulatory Visit | Attending: Radiation Oncology | Admitting: Radiation Oncology

## 2013-06-24 ENCOUNTER — Encounter: Payer: Self-pay | Admitting: Radiation Oncology

## 2013-06-24 VITALS — BP 80/56 | HR 157 | Temp 97.7°F | Ht 66.0 in | Wt 108.6 lb

## 2013-06-24 DIAGNOSIS — C155 Malignant neoplasm of lower third of esophagus: Secondary | ICD-10-CM

## 2013-06-24 MED ORDER — HYDROCODONE-ACETAMINOPHEN 7.5-325 MG/15ML PO SOLN: 10.0000 mL | ORAL | Status: DC | PRN

## 2013-06-24 NOTE — Progress Notes (Addendum)
Mr. Salvucci has received 3 fractions to the esophagus.  He c/o burning in the lower esophageal region.  His pulse is 150, but regular rate noted.  On second check his pulse was 130.  O2 sat 100% today.  Hypotensive at 80/56.  He adits to feeling lightheaded today and reports he has not had any Osmolite today.  He states that he is drinking water, "but it comes back up".  Burping frequently.

## 2013-06-24 NOTE — Progress Notes (Signed)
Weekly Management Note:  Outpatient Current Dose:  5.4 Gy  Projected Dose: 50.4 Gy   Narrative:  The patient presents for routine under treatment assessment.  CBCT/MVCT images/Port film x-rays were reviewed.  The chart was checked.  He is regurgitating osmolite and water.  Feels a little lightheaded.  C/o GEJ pain.  Taking hycet q 8 hrs.  Tachycardic.  Discussed possible feeding tube with Dr. Lenis Noon at [facility] in the past week but would rather wait and see if tumor "opens up."  Physical Findings:  height is 5\' 6"  (1.676 m) and weight is 108 lb 9.6 oz (49.261 kg). His temperature is 97.7 F (36.5 C). His blood pressure is 80/56 and his pulse is 157. His oxygen saturation is 100%.  Pulse regular. Burping/ regurgitating water in a cup.  Ambulatory.  CBC    Component Value Date/Time   WBC 21.2* 06/23/2013 1125   WBC 13.6* 06/21/2013 1130   RBC 3.91* 06/23/2013 1125   RBC 3.70* 06/21/2013 1130   HGB 10.8* 06/23/2013 1125   HGB 10.4* 06/21/2013 1130   HCT 33.8* 06/23/2013 1125   HCT 32.2* 06/21/2013 1130   PLT 520* 06/23/2013 1125   PLT 521* 06/21/2013 1130   MCV 86.4 06/23/2013 1125   MCV 87.0 06/21/2013 1130   MCH 27.6 06/23/2013 1125   MCH 28.1 06/21/2013 1130   MCHC 32.0 06/23/2013 1125   MCHC 32.3 06/21/2013 1130   RDW 12.9 06/23/2013 1125   RDW 12.9 06/21/2013 1130   LYMPHSABS 0.7* 06/23/2013 1125   LYMPHSABS 2.8 10/30/2011 1726   MONOABS 1.0* 06/23/2013 1125   MONOABS 1.1* 10/30/2011 1726   EOSABS 0.0 06/23/2013 1125   EOSABS 0.2 10/30/2011 1726   BASOSABS 0.0 06/23/2013 1125   BASOSABS 0.1 10/30/2011 1726     CMP     Component Value Date/Time   NA 140 06/23/2013 1125   NA 133* 12/04/2011 1505   K 4.1 06/23/2013 1125   K 3.9 12/04/2011 1505   CL 94* 12/04/2011 1505   CO2 20* 06/23/2013 1125   CO2 28 12/04/2011 1505   GLUCOSE 104 06/23/2013 1125   GLUCOSE 91 12/04/2011 1505   BUN 26.4* 06/23/2013 1125   BUN 10 12/04/2011 1505   CREATININE 0.7 06/23/2013 1125   CREATININE 0.54 12/04/2011 1505   CALCIUM 10.6* 06/23/2013 1125   CALCIUM 10.4 12/04/2011 1505   PROT 7.7 06/23/2013 1125   PROT 8.2 12/04/2011 1505   ALBUMIN 3.7 06/23/2013 1125   ALBUMIN 3.6 12/04/2011 1505   AST 17 06/23/2013 1125   AST 14 12/04/2011 1505   ALT 8 06/23/2013 1125   ALT 8 12/04/2011 1505   ALKPHOS 64 06/23/2013 1125   ALKPHOS 58 12/04/2011 1505   BILITOT 0.36 06/23/2013 1125   BILITOT 0.2* 12/04/2011 1505   GFRNONAA >90 11/01/2011 0700   GFRAA >90 11/01/2011 0700     Impression:  The patient is tolerating radiotherapy. Poor PO intake due to pain, obstruction.  Plan:  Continue radiotherapy as planned. I urged him to go the ED for possible admission.  He needs labs and IV fluids at the very least.    I told him I would see if direct admission was possible. He adamantly refused this - wanted to go home and take care of his dogs.  I offered getting STAT labs in clinic and giving IV fluids to hopefully protect his kidneys through the weekend. He also refused this.  I was emphatic about the need for medical attention in  light of his complaints/sx/sx above.  He refused against my medical advice. He did accept a refill of Hycet to address his pain. He can take this q 4hrs prn.  He knows to get a ride to ED or call 911 if condition worsens this weekend.   I asked Sonda Rumble to take his vitals again on Monday and have an MD see him. Dr. Mitzi Hansen will assume his care while I am on maternity leave. Pt and I discussed he may need a J-Tube sooner rather than later - another reason to be admitted to the hospital. As he refuses going to ED or admission, potential feeding tube need will have to be re discussed at a later time.  -----------------------------------  Lonie Peak, MD

## 2013-06-26 ENCOUNTER — Other Ambulatory Visit: Payer: Self-pay | Admitting: Oncology

## 2013-06-27 ENCOUNTER — Encounter (HOSPITAL_COMMUNITY): Payer: Self-pay

## 2013-06-27 ENCOUNTER — Ambulatory Visit (HOSPITAL_BASED_OUTPATIENT_CLINIC_OR_DEPARTMENT_OTHER): Payer: Medicaid Other

## 2013-06-27 ENCOUNTER — Encounter: Payer: Self-pay | Admitting: Radiation Oncology

## 2013-06-27 ENCOUNTER — Ambulatory Visit
Admission: RE | Admit: 2013-06-27 | Discharge: 2013-06-27 | Disposition: A | Discharge status: Home / Self Care | Payer: Medicaid Other | Source: Ambulatory Visit | Attending: Radiation Oncology | Admitting: Radiation Oncology

## 2013-06-27 ENCOUNTER — Inpatient Hospital Stay (HOSPITAL_COMMUNITY)
Admission: AD | Admit: 2013-06-27 | Discharge: 2013-07-03 | DRG: 374 | Disposition: A | Discharge status: Home Health | Payer: Medicaid Other | Source: Ambulatory Visit | Attending: Internal Medicine | Admitting: Internal Medicine

## 2013-06-27 ENCOUNTER — Other Ambulatory Visit: Payer: Self-pay | Admitting: Radiation Oncology

## 2013-06-27 VITALS — BP 99/63 | HR 135 | Temp 97.8°F | Resp 20 | Ht 66.0 in

## 2013-06-27 DIAGNOSIS — Z923 Personal history of irradiation: Secondary | ICD-10-CM

## 2013-06-27 DIAGNOSIS — D630 Anemia in neoplastic disease: Secondary | ICD-10-CM | POA: Diagnosis present

## 2013-06-27 DIAGNOSIS — R1310 Dysphagia, unspecified: Secondary | ICD-10-CM

## 2013-06-27 DIAGNOSIS — C023 Malignant neoplasm of anterior two-thirds of tongue, part unspecified: Secondary | ICD-10-CM

## 2013-06-27 DIAGNOSIS — R4789 Other speech disturbances: Secondary | ICD-10-CM | POA: Diagnosis present

## 2013-06-27 DIAGNOSIS — Z9221 Personal history of antineoplastic chemotherapy: Secondary | ICD-10-CM

## 2013-06-27 DIAGNOSIS — R911 Solitary pulmonary nodule: Secondary | ICD-10-CM | POA: Diagnosis present

## 2013-06-27 DIAGNOSIS — D6481 Anemia due to antineoplastic chemotherapy: Secondary | ICD-10-CM | POA: Diagnosis present

## 2013-06-27 DIAGNOSIS — C155 Malignant neoplasm of lower third of esophagus: Secondary | ICD-10-CM

## 2013-06-27 DIAGNOSIS — R627 Adult failure to thrive: Secondary | ICD-10-CM | POA: Diagnosis present

## 2013-06-27 DIAGNOSIS — D638 Anemia in other chronic diseases classified elsewhere: Secondary | ICD-10-CM

## 2013-06-27 DIAGNOSIS — Z800 Family history of malignant neoplasm of digestive organs: Secondary | ICD-10-CM

## 2013-06-27 DIAGNOSIS — E860 Dehydration: Secondary | ICD-10-CM

## 2013-06-27 DIAGNOSIS — C159 Malignant neoplasm of esophagus, unspecified: Secondary | ICD-10-CM

## 2013-06-27 DIAGNOSIS — D72829 Elevated white blood cell count, unspecified: Secondary | ICD-10-CM

## 2013-06-27 DIAGNOSIS — F17200 Nicotine dependence, unspecified, uncomplicated: Secondary | ICD-10-CM | POA: Diagnosis present

## 2013-06-27 DIAGNOSIS — C801 Malignant (primary) neoplasm, unspecified: Secondary | ICD-10-CM

## 2013-06-27 DIAGNOSIS — Z681 Body mass index (BMI) 19 or less, adult: Secondary | ICD-10-CM

## 2013-06-27 DIAGNOSIS — E871 Hypo-osmolality and hyponatremia: Secondary | ICD-10-CM

## 2013-06-27 DIAGNOSIS — C772 Secondary and unspecified malignant neoplasm of intra-abdominal lymph nodes: Secondary | ICD-10-CM | POA: Diagnosis present

## 2013-06-27 DIAGNOSIS — E43 Unspecified severe protein-calorie malnutrition: Secondary | ICD-10-CM

## 2013-06-27 DIAGNOSIS — T451X5A Adverse effect of antineoplastic and immunosuppressive drugs, initial encounter: Secondary | ICD-10-CM | POA: Diagnosis present

## 2013-06-27 DIAGNOSIS — Z720 Tobacco use: Secondary | ICD-10-CM

## 2013-06-27 DIAGNOSIS — R4702 Dysphasia: Secondary | ICD-10-CM

## 2013-06-27 DIAGNOSIS — Z9889 Other specified postprocedural states: Secondary | ICD-10-CM

## 2013-06-27 DIAGNOSIS — C029 Malignant neoplasm of tongue, unspecified: Secondary | ICD-10-CM

## 2013-06-27 LAB — CBC WITH DIFFERENTIAL/PLATELET
Eosinophils Absolute: 0.1 10*3/uL (ref 0.0–0.7)
Eosinophils Relative: 1 % (ref 0–5)
HCT: 24.1 % — ABNORMAL LOW (ref 39.0–52.0)
Hemoglobin: 7.8 g/dL — ABNORMAL LOW (ref 13.0–17.0)
Lymphs Abs: 0.6 10*3/uL — ABNORMAL LOW (ref 0.7–4.0)
MCH: 27.8 pg (ref 26.0–34.0)
MCV: 85.8 fL (ref 78.0–100.0)
Monocytes Absolute: 0.8 10*3/uL (ref 0.1–1.0)
Monocytes Relative: 6 % (ref 3–12)
Neutro Abs: 10.9 10*3/uL — ABNORMAL HIGH (ref 1.7–7.7)
Platelets: 365 10*3/uL (ref 150–400)
RBC: 2.81 MIL/uL — ABNORMAL LOW (ref 4.22–5.81)
WBC: 12.4 10*3/uL — ABNORMAL HIGH (ref 4.0–10.5)

## 2013-06-27 LAB — BASIC METABOLIC PANEL
BUN: 20 mg/dL (ref 6–23)
CO2: 25 mEq/L (ref 19–32)
Chloride: 100 mEq/L (ref 96–112)
Creatinine, Ser: 0.34 mg/dL — ABNORMAL LOW (ref 0.50–1.35)

## 2013-06-27 LAB — MAGNESIUM: Magnesium: 1.8 mg/dL (ref 1.5–2.5)

## 2013-06-27 MED ORDER — DEXTROSE-NACL 5-0.9 % IV SOLN
INTRAVENOUS | Status: DC
  Administered 2013-06-27: 100 mL/h via INTRAVENOUS
  Administered 2013-06-28 – 2013-07-02 (×9): via INTRAVENOUS

## 2013-06-27 MED ORDER — ENOXAPARIN SODIUM 40 MG/0.4ML ~~LOC~~ SOLN: 40.0000 mg | SUBCUTANEOUS | Status: DC

## 2013-06-27 MED ORDER — ACETAMINOPHEN 160 MG/5ML PO SOLN: 650.0000 mg | ORAL | Status: DC | PRN

## 2013-06-27 MED ORDER — ONDANSETRON HCL 4 MG PO TABS: 4.0000 mg | ORAL_TABLET | Freq: Four times a day (QID) | ORAL | Status: DC | PRN

## 2013-06-27 MED ORDER — HYDROCODONE-ACETAMINOPHEN 7.5-325 MG/15ML PO SOLN
10.0000 mL | ORAL | Status: DC | PRN
  Administered 2013-06-27 – 2013-06-28 (×2): 15 mL via ORAL
  Filled 2013-06-27 (×2): qty 15

## 2013-06-27 MED ORDER — OSMOLITE PO LIQD: 237.0000 mL | ORAL | Status: DC

## 2013-06-27 MED ORDER — SODIUM CHLORIDE 0.9 % IV SOLN
INTRAVENOUS | Status: DC
  Administered 2013-06-27: 13:00:00 via INTRAVENOUS

## 2013-06-27 MED ORDER — ONDANSETRON HCL 4 MG/2ML IJ SOLN
4.0000 mg | Freq: Four times a day (QID) | INTRAMUSCULAR | Status: DC | PRN
  Administered 2013-06-27: 4 mg via INTRAVENOUS
  Filled 2013-06-27: qty 2

## 2013-06-27 MED ORDER — MORPHINE SULFATE 4 MG/ML IJ SOLN: 4.0000 mg | INTRAMUSCULAR | Status: DC | PRN

## 2013-06-27 MED ORDER — OSMOLITE 1.5 CAL PO LIQD
237.0000 mL | Freq: Four times a day (QID) | ORAL | Status: DC | PRN
  Filled 2013-06-27: qty 237

## 2013-06-27 NOTE — Progress Notes (Addendum)
CC: Dr. Dorothy Puffer, Dr. Mancel Bale   Clinic note: Henry Leonard is seen today stating that he is dehydrated.   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 He was seen by Dr. Lenis Noon at [facility] last Friday, and he felt that it would be okay to proceed with some type of feeding tube even though he may be a candidate for distal esophagectomy. He is on been taking 2 cans of Osmolite a day with minimal fluid. He is receiving chemotherapy under the direction of Dr. Mancel Bale here at [facility].  Physical examination: Alert and oriented. Vital signs: Filed Vitals:   06/27/13 1107  BP: 99/63  Pulse: 135  Temp:   Resp:    His mouth is dry. Abdomen: Soft, nontender.  Laboratory data: Lab Results  Component Value Date   WBC 21.2* 06/23/2013   HGB 10.8* 06/23/2013   HCT 33.8* 06/23/2013   MCV 86.4 06/23/2013   PLT 520* 06/23/2013   Impression: I believe that the patient is dehydrated. Radiation oncology nursing has been in touch with Dr. Truett Perna, the patient will be admitted to the hospitalist . He'll be scheduled for a feeding tube, and a continuation of his preoperative chemoradiation. He'll be seen by Dr. Mitzi Hansen later this week for weekly management.

## 2013-06-27 NOTE — Progress Notes (Addendum)
After speaking with Dr. Darnelle Catalan regarding a direct admit, it was stated that a bed will not be available until 2 patients are discharged room # [place] at [place].  In the interim, Dr. Darnelle Catalan has requested that Henry Leonard receive NS hydration at 100 ml/hr in medical oncology infusion until he can be transferred to [address].  Gladis Riffle, Consulting civil engineer and her staff are aware of this pending admission.  Henry Leonard refused his radiation therapy treatment today and the [name], RTT has ben notified.  He was offered IV hydration on Friday of last week, but refused.  He continues to have abdominal discomfort and is taking Hycet for pain management. Burping frequently which is his normal.  [facility] and informed them of the admission.

## 2013-06-27 NOTE — Progress Notes (Signed)
Henry Leonard in today with report of feeling worse than on Friday and requesting to be admitted.  He is able to drink "some" water, but no solid food.  His surgeon at [facility] states he needs a feeding tube and cn place it if necessary.  He is currently having chemotherapy which started on last week per Dr. Rolm Baptise and his next cycle is on this Wed.  He states, per the [facility], that if his tumor has not responded as expected after radiation and chemotherapy, he may need surgery.

## 2013-06-27 NOTE — Progress Notes (Signed)
Patient being admitted to room 1306. Report given to Addison Naegeli, RN and patient transported via wheelchair by Irving Burton, NT without difficulty. Patient AAO with IVF infusing to [facility] at time of transport from infusion room.

## 2013-06-27 NOTE — Consult Note (Signed)
Reason for Consult:placement of surgical J tube Referring Physician: Dr C. Rama  Henry Leonard is an 57 y.o. male.  HPI: Henry Leonard is an 57 y.o. 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 Lenis Noon at [facility] for surgical resection after he completes his chemotherapy (had 1st chemo 06/22/13) Henry Leonard (has had 3 treatments so far), who was referred as a direct admission by Dr. Dayton Scrape after he refused his radiation treatment today, secondary to concerns for dehydration. Henry Leonard has not been able to take in much PO, he is on a liquid diet secondary to dysphagia. He has been unable to consistently take in POs, no complaints of frank vomiting but has refluxing of liquids up after attempts to drink. He has only been able to take in 2 cans of Osmolite a day with minimal fluid intake otherwise. Dr. Truett Perna suggests a surgical referral for placement of a surgical J tube since, if he tolerates current treatment, the plan is to pursue esophagectomy. Oral intake aggravates his symptoms, no alleviating factors other than not attempting to drink.   CCS was consulted by Dr Darnelle Catalan for surgical J tube. Prior to starting this round of chemo/xrt - pt was drinking 5-6 shakes during the day in the summer before he was dx with esophageal cancer   Past Medical History  Diagnosis Date  . Tobacco abuse   . Leucocytosis 11/01/11  . Dysphagia   . Cancer of tongue 3/16/ 13    Associated Neck Nodes; T2N2  . Seasonal allergies   . S/P radiation therapy 02/24/12 - 04/06/12    Surgical bed and bilateral neck / 60 Gy / 30 Fractions VMAT Plan  . Status post chemotherapy 02/24/12 -03/2012     Cisplatin Concurrently with Radiation  Therapy  . Esophageal cancer 05/11/13    T3 Squamous Cell Carcinoma,  Invasive Well differentiated  . Edentulous     Past Surgical History  Procedure Laterality Date  . Biopsy tongue  11/12/11    Tongue - Squamous Cell Cancer  . Glossectomy  01/19/12    with B/l neck dissection; radial forearm free flap  . Multiple tooth extractions  11/2011  . Feeding tube insertion and removal  11/2011 inserted removed 10/2012  . Tracheostomy  11/2011  . Tracheal closed stent removal w/ mlb  11/20/2102    Family History  Problem Relation Age of Onset  . Esophageal cancer Father   . Seizures Mother     Social History:  reports that he has been smoking Cigarettes.  He has a 52.5 pack-year smoking history. He has never used smokeless tobacco. He reports that he drinks alcohol. He reports that he does not use illicit drugs.  Allergies:  Allergies  Allergen Reactions  . Penicillins Rash    Medications: I have reviewed the patient's current medications.  No results found for this or any previous visit (from the past 48 hour(s)).  No results found.  Review of Systems  Constitutional: Positive for weight loss. Negative for fever and chills.  HENT: Negative for nosebleeds.        Tongue cancer last summer s/p resection, chemo/xrt; +dry mouth   Eyes: Negative for blurred vision.  Respiratory: Negative for shortness of breath.   Cardiovascular: Negative for chest pain, palpitations,  orthopnea, leg swelling and PND.       Denies DOE  Gastrointestinal: Positive for nausea and constipation. Negative for vomiting.       Upper abd gas pain; intermittent episodes of regurgitation  Genitourinary: Negative for dysuria and hematuria.  Musculoskeletal: Negative.   Skin: Negative for itching and rash.  Neurological: Positive for weakness. Negative for dizziness, focal weakness, seizures, loss of consciousness and headaches.       Denies TIAs, amaurosis fugax  Endo/Heme/Allergies: Does not bruise/bleed easily.   Psychiatric/Behavioral: Positive for depression. The patient is not nervous/anxious.    Blood pressure 99/63, pulse 115, temperature 98 F (36.7 C), temperature source Axillary, resp. rate 20, height 5\' 6"  (1.676 m), weight 105 lb (47.628 kg), SpO2 100.00%. Physical Exam  Vitals reviewed. Constitutional: He is oriented to person, place, and time. He appears well-developed. No distress.  HENT:  Head: Normocephalic and atraumatic.  Right Ear: External ear normal.  Left Ear: External ear normal.  Eyes: Conjunctivae are normal. No scleral icterus.  Neck: Normal range of motion. Neck supple. No tracheal deviation present. No thyromegaly present.  Well healed scar  Cardiovascular: Normal rate and normal heart sounds.   Respiratory: Effort normal and breath sounds normal. No stridor. No respiratory distress. He has no wheezes.  GI: Soft. He exhibits no distension. There is no tenderness. There is no rebound.  Old g tube site  Musculoskeletal: He exhibits no edema and no tenderness.  Lymphadenopathy:    He has no cervical adenopathy.  Neurological: He is alert and oriented to person, place, and time. He exhibits normal muscle tone.  Skin: Skin is warm and dry. No rash noted. He is not diaphoretic. No erythema. No pallor.  Psychiatric: He has a normal mood and affect. His behavior is normal. Judgment and thought content normal.    Assessment/Plan: Esophageal/upper stomach cancer T3N1 H/o tongue cancer Tobacco use Dehydration  Leukocytosis - due to dehyrdation? PCMN  Will call Dr Lenis Noon at [facility] in am and discuss case with him to make sure they are ok with [name] doing it.  Discussed risk/benefits of surgical J tube.  Cont IVF resuscitation Npo at 0300 just in case we proceed with j tube in AM  Mary Sella. Andrey Campanile, MD, FACS General, Bariatric, & Minimally Invasive Surgery [facility] Surgery, [place]    [facility] M 06/27/2013, 5:28 PM

## 2013-06-27 NOTE — H&P (Signed)
[facility] History and Physical  LUCAN RINER ZOX:[id number] DOB: [birth year] DOA: 06/27/2013  Referring physician: Dr. Mancel Bale PCP: Buckner Malta, MD   Chief Complaint: "I am dehydrated".   History of Present Illness: Henry Leonard is an 57 y.o. 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 Lenis Noon at [facility] for surgical resection after he completes his chemotherapy (had 1st chemo 06/22/13) [name] (has had 3 treatments so far), who was referred as a direct admission by Dr. Dayton Scrape after he refused his radiation treatment today, secondary to concerns for dehydration.  Mr. Unangst has not been able to take in much PO, he is on a liquid diet secondary to dysphagia.  He has been unable to consistently take in POs, no complaints of frank vomiting but has refluxing of liquids up after attempts to drink.  He has only been able to take in 2 cans of Osmolite a day with minimal fluid intake otherwise.  Dr. Truett Perna suggests a surgical referral for placement of a surgical J tube since, if he tolerates current treatment, the plan is to pursue esophagectomy.  Oral intake aggravates his symptoms, no alleviating factors other than not attempting to drink.   Review of Systems: Constitutional: No fever, no chills;  Appetite normal; + weight loss of about 20 lbs in 2 months, no weight gain, + fatigue.  HEENT: No blurry vision, no diplopia, no pharyngitis, + dysphagia, +chronic dry mouth from prior h/o XRT to tongue CV: No chest pain, no palpitations, no PND.  Resp: No SOB, no cough, no pleuritic pain. GI: + nausea, + vomiting---describes it more as reflux than actual vomiting, no diarrhea, no melena,  no hematochezia, no constipation.  GU: No dysuria, no hematuria, no frequency, no urgency. MSK: no myalgias, no arthralgias.  Neuro:  No headache, no focal neurological deficits, no history of seizures.  Psych: + depression, no anxiety.  Endo: No heat intolerance, no cold intolerance, no polyuria, no polydipsia  Skin: No rashes, no skin lesions.  Heme: No easy bruising.  Travel history:   Past Medical History Past Medical History  Diagnosis Date  . Tobacco abuse   . Leucocytosis 11/01/11  . Dysphagia   . Cancer of tongue 3/16/ 13    Associated Neck Nodes  . Seasonal allergies   . S/P radiation therapy 02/24/12 - 04/06/12    Surgical bed and bilateral neck / 60 Gy / 30 Fractions VMAT Plan  . Status post chemotherapy 02/24/12 -03/2012     Cisplatin Concurrently with Radiation Therapy  . Esophageal cancer 05/11/13    T3 Squamous Cell Carcinoma,  Invasive Well differentiated  . Edentulous      Past Surgical History Past Surgical History  Procedure Laterality Date  . Biopsy tongue  11/12/11    Tongue - Squamous Cell Cancer  . Glossectomy  01/19/12  . Multiple tooth extractions  11/2011  . Feeding tube insertion and removal  11/2011 inserted removed 10/2012  . Tracheostomy  11/2011  . Tracheal closed stent removal w/ mlb  11/20/2102     Social History: History   Social History  . Marital Status: Legally Separated    Spouse Name: N/A    Number of Children: 0  . Years of Education: N/A   Occupational History  .  Unemployed    Social History Main Topics  . Smoking status: Current Every Day Smoker -- 1.50 packs/day for 35 years    Types: Cigarettes  . Smokeless tobacco: Never Used     Comment: 11/19/11 currently 10 cigs daily  . Alcohol Use: Yes     Comment: update 06/08/13 - Occ. beer.    . Drug Use: No  . Sexual Activity: No   Other Topics Concern  . Not on file   Social History Narrative   Patient with history of smoking 1.5 packs a day for 35 years. Patient still drinking 2-3 beers  per day.   Is unemployed. Patient has no car. Neighbor assists in transportation issues.   Divorced-has room mate   Prior employed as courier for bank-   Currently draws social security disability and has Medicaid                Family History:  Family History  Problem Relation Age of Onset  . Esophageal cancer Father   . Seizures Mother     Allergies: Penicillins  Meds: Prior to Admission medications   Medication Sig Start Date End Date Taking? Authorizing Provider  HYDROcodone-acetaminophen (HYCET) 7.5-325 mg/15 ml solution Take 10-15 mLs by mouth every 4 (four) hours as needed (pain). 06/24/13  Yes Lonie Peak, MD  Naphazoline HCl (CLEAR EYES OP) Place 2 drops into both eyes daily as needed (For dry or irritated eyes.).   Yes Historical Provider, MD  Nutritional Supplements (OSMOLITE) LIQD Take 237 mLs by mouth See admin instructions. He drinks 3-4 cans per day.   Yes Historical Provider, MD  promethazine (PHENERGAN) 6.25 MG/5ML syrup Take 12.5 mg by mouth every 4 (four) hours as needed for nausea (Call if nausea not controlled.).   Yes Historical Provider, MD  lidocaine-prilocaine (EMLA) cream Apply to port a cath site one hour prior to use. Do not rub in. Cover with plastic. 06/21/13   Ladene Artist, MD    Physical Exam: Filed Vitals:   06/27/13 1500  BP: 99/63  Pulse: 115  Temp: 98 F (36.7 C)  TempSrc: Axillary  Resp: 20  Height: 5\' 6"  (1.676 m)  Weight: 47.628 kg (105 lb)  SpO2: 100%     Physical Exam: Blood pressure 99/63, pulse 115, temperature 98 F (36.7 C), temperature source Axillary, resp. rate 20, height 5\' 6"  (1.676 m), weight 47.628 kg (105 lb), SpO2 100.00%. Gen: No acute distress. Head: Normocephalic, atraumatic. Eyes: PERRL, EOMI, sclerae nonicteric. Mouth: Oropharynx shows some radiation-induced damage to his posterior pharynx Neck: Supple, no thyromegaly, no lymphadenopathy, no jugular venous distention. Chest: Lungs clear to auscultation  bilaterally. CV: Heart sounds are tachycardic, regular, no murmurs rubs or gallops. Abdomen: Soft, nontender, nondistended with normal active bowel sounds. Extremities: Extremities are without clubbing, edema, or cyanosis. Skin: Warm and dry. Neuro: Alert and oriented times 3; cranial nerves II through XII grossly intact. Psych: Mood and affect slightly depressed.  Labs on Admission:  Basic Metabolic Panel:  Recent Labs Lab 06/22/13 1320 06/23/13 1125  NA 139 140  K 4.2 4.1  CO2 24 20*  GLUCOSE 122 104  BUN 21.9 26.4*  CREATININE 0.6* 0.7  CALCIUM 10.5* 10.6*   Liver Function Tests:  Recent Labs Lab 06/22/13 1320 06/23/13 1125  AST 13 17  ALT 6 8  ALKPHOS 67 64  BILITOT 0.30 0.36  PROT 7.6 7.7  ALBUMIN 3.6 3.7   CBC:  Recent Labs Lab 06/21/13 1130 06/22/13 1321 06/23/13 1125  WBC 13.6* 10.6* 21.2*  NEUTROABS  --  10.1* 19.5*  HGB 10.4* 9.8* 10.8*  HCT 32.2* 29.5* 33.8*  MCV 87.0 84.8 86.4  PLT 521* 455* 520*    Radiological Exams on Admission: No results found.   Assessment/Plan Principal Problem:   Dehydration secondary to inability to take in by mouth food/liquids The patient will be admitted and started on IV fluids. Monitor electrolytes. Active Problems:   Leucocytosis Repeat CBC.   Carcinoma of distal third of esophagus / dysphasia Patient will need to have a surgical J-tube placed for nutritional purposes. He is extremely malnourished. A surgical consultation has been requested. Case discussed with Dr. Truett Perna.   Anemia of chronic disease Hemoglobin stable. No current indication for transfusion.   Protein-calorie malnutrition, severe Dietitian consultation requested. Surgical consultation for placement of a surgical J-tube.   Tobacco abuse Counseled.  Code Status: Full. Family Communication: No family at the bedside. Disposition Plan: Home when stable.  Time spent: One hour.  RAMA,CHRISTINA [facility] Pager [phone]  If  7PM-7AM, please contact night-coverage [email or web address] Password [email or web address] 06/27/2013, 4:32 PM

## 2013-06-28 ENCOUNTER — Encounter: Payer: Self-pay | Admitting: *Deleted

## 2013-06-28 ENCOUNTER — Encounter (HOSPITAL_COMMUNITY): Payer: Medicaid Other | Admitting: *Deleted

## 2013-06-28 ENCOUNTER — Inpatient Hospital Stay (HOSPITAL_COMMUNITY): Payer: Medicaid Other | Admitting: *Deleted

## 2013-06-28 ENCOUNTER — Encounter (HOSPITAL_COMMUNITY): Payer: Self-pay | Admitting: *Deleted

## 2013-06-28 ENCOUNTER — Ambulatory Visit: Payer: Medicaid Other

## 2013-06-28 ENCOUNTER — Encounter (HOSPITAL_COMMUNITY)
Admission: AD | Disposition: A | Discharge status: Home Health | Payer: Self-pay | Source: Ambulatory Visit | Attending: Internal Medicine

## 2013-06-28 ENCOUNTER — Ambulatory Visit: Payer: Medicaid Other | Admitting: Radiation Oncology

## 2013-06-28 DIAGNOSIS — R1319 Other dysphagia: Secondary | ICD-10-CM

## 2013-06-28 DIAGNOSIS — E46 Unspecified protein-calorie malnutrition: Secondary | ICD-10-CM

## 2013-06-28 DIAGNOSIS — F17200 Nicotine dependence, unspecified, uncomplicated: Secondary | ICD-10-CM

## 2013-06-28 HISTORY — PX: GASTROJEJUNOSTOMY: SHX1697

## 2013-06-28 LAB — BASIC METABOLIC PANEL
BUN: 16 mg/dL (ref 6–23)
CO2: 26 mEq/L (ref 19–32)
Calcium: 8.8 mg/dL (ref 8.4–10.5)
Chloride: 104 mEq/L (ref 96–112)
GFR calc non Af Amer: >90 mL/min (ref >90)
Glucose, Bld: 115 mg/dL — ABNORMAL HIGH (ref 70–99)
Potassium: 3.9 mEq/L (ref 3.5–5.1)
Sodium: 137 mEq/L (ref 135–145)

## 2013-06-28 LAB — SURGICAL PCR SCREEN: Staphylococcus aureus: NEGATIVE

## 2013-06-28 SURGERY — GASTROJEJUNOSTOMY
Anesthesia: General | Site: Abdomen | Wound class: Clean Contaminated

## 2013-06-28 MED ORDER — FENTANYL CITRATE 0.05 MG/ML IJ SOLN
25.0000 ug | INTRAMUSCULAR | Status: DC | PRN
  Administered 2013-06-28 (×2): 50 ug via INTRAVENOUS

## 2013-06-28 MED ORDER — SODIUM CHLORIDE 0.9 % IJ SOLN
INTRAMUSCULAR | Status: AC
  Filled 2013-06-28: qty 3

## 2013-06-28 MED ORDER — LACTATED RINGERS IV SOLN
INTRAVENOUS | Status: DC | PRN
  Administered 2013-06-28: 11:00:00 via INTRAVENOUS

## 2013-06-28 MED ORDER — OXYCODONE-ACETAMINOPHEN 5-325 MG PO TABS: 1.0000 | ORAL_TABLET | ORAL | Status: DC | PRN

## 2013-06-28 MED ORDER — LACTATED RINGERS IV SOLN
INTRAVENOUS | Status: DC | PRN
  Administered 2013-06-28 (×2): via INTRAVENOUS

## 2013-06-28 MED ORDER — ONDANSETRON HCL 4 MG/2ML IJ SOLN
INTRAMUSCULAR | Status: DC | PRN
  Administered 2013-06-28: 4 mg via INTRAVENOUS

## 2013-06-28 MED ORDER — MIDAZOLAM HCL 5 MG/5ML IJ SOLN
INTRAMUSCULAR | Status: DC | PRN
  Administered 2013-06-28: 2 mg via INTRAVENOUS

## 2013-06-28 MED ORDER — BACITRACIN-NEOMYCIN-POLYMYXIN 400-5-5000 EX OINT
TOPICAL_OINTMENT | CUTANEOUS | Status: AC
  Filled 2013-06-28: qty 1

## 2013-06-28 MED ORDER — HYDROMORPHONE HCL PF 1 MG/ML IJ SOLN
INTRAMUSCULAR | Status: DC | PRN
  Administered 2013-06-28 (×2): 1 mg via INTRAVENOUS

## 2013-06-28 MED ORDER — GLYCOPYRROLATE 0.2 MG/ML IJ SOLN
INTRAMUSCULAR | Status: DC | PRN
  Administered 2013-06-28: 0.6 mg via INTRAVENOUS

## 2013-06-28 MED ORDER — FENTANYL CITRATE 0.05 MG/ML IJ SOLN
INTRAMUSCULAR | Status: DC | PRN
  Administered 2013-06-28 (×5): 50 ug via INTRAVENOUS

## 2013-06-28 MED ORDER — METRONIDAZOLE IN NACL 5-0.79 MG/ML-% IV SOLN
INTRAVENOUS | Status: DC | PRN
  Administered 2013-06-28: 500 mg via INTRAVENOUS

## 2013-06-28 MED ORDER — SODIUM CHLORIDE 0.9 % IR SOLN
Status: DC | PRN
  Administered 2013-06-28: 1000 mL

## 2013-06-28 MED ORDER — METRONIDAZOLE IN NACL 5-0.79 MG/ML-% IV SOLN
INTRAVENOUS | Status: AC
  Filled 2013-06-28: qty 100

## 2013-06-28 MED ORDER — CISATRACURIUM BESYLATE (PF) 10 MG/5ML IV SOLN
INTRAVENOUS | Status: DC | PRN
  Administered 2013-06-28: 4 mg via INTRAVENOUS
  Administered 2013-06-28: 2 mg via INTRAVENOUS
  Administered 2013-06-28: 4 mg via INTRAVENOUS
  Administered 2013-06-28: 2 mg via INTRAVENOUS

## 2013-06-28 MED ORDER — DEXAMETHASONE SODIUM PHOSPHATE 4 MG/ML IJ SOLN
INTRAMUSCULAR | Status: DC | PRN
  Administered 2013-06-28: 10 mg via INTRAVENOUS

## 2013-06-28 MED ORDER — METOCLOPRAMIDE HCL 5 MG/ML IJ SOLN
INTRAMUSCULAR | Status: DC | PRN
  Administered 2013-06-28: 10 mg via INTRAVENOUS

## 2013-06-28 MED ORDER — LACTATED RINGERS IV SOLN: INTRAVENOUS | Status: DC

## 2013-06-28 MED ORDER — NEOSTIGMINE METHYLSULFATE 1 MG/ML IJ SOLN
INTRAMUSCULAR | Status: DC | PRN
  Administered 2013-06-28: 5 mg via INTRAVENOUS

## 2013-06-28 MED ORDER — SUCCINYLCHOLINE CHLORIDE 20 MG/ML IJ SOLN
INTRAMUSCULAR | Status: DC | PRN
  Administered 2013-06-28: 100 mg via INTRAVENOUS

## 2013-06-28 MED ORDER — CIPROFLOXACIN IN D5W 400 MG/200ML IV SOLN
400.0000 mg | INTRAVENOUS | Status: AC
  Administered 2013-06-28: 400 mg via INTRAVENOUS
  Filled 2013-06-28: qty 200

## 2013-06-28 MED ORDER — PHENYLEPHRINE HCL 10 MG/ML IJ SOLN
INTRAMUSCULAR | Status: DC | PRN
  Administered 2013-06-28: 120 ug via INTRAVENOUS
  Administered 2013-06-28: 160 ug via INTRAVENOUS
  Administered 2013-06-28 (×2): 120 ug via INTRAVENOUS

## 2013-06-28 MED ORDER — METRONIDAZOLE IN NACL 5-0.79 MG/ML-% IV SOLN
500.0000 mg | INTRAVENOUS | Status: DC
  Filled 2013-06-28: qty 100

## 2013-06-28 MED ORDER — KETAMINE HCL 10 MG/ML IJ SOLN
INTRAMUSCULAR | Status: DC | PRN
  Administered 2013-06-28: 30 mg via INTRAVENOUS

## 2013-06-28 MED ORDER — FENTANYL CITRATE 0.05 MG/ML IJ SOLN
INTRAMUSCULAR | Status: AC
  Filled 2013-06-28: qty 2

## 2013-06-28 MED ORDER — PROPOFOL 10 MG/ML IV BOLUS
INTRAVENOUS | Status: DC | PRN
  Administered 2013-06-28: 160 mg via INTRAVENOUS

## 2013-06-28 MED ORDER — MORPHINE SULFATE 2 MG/ML IJ SOLN
1.0000 mg | INTRAMUSCULAR | Status: DC | PRN
  Administered 2013-06-28 (×2): 2 mg via INTRAVENOUS
  Administered 2013-06-28 – 2013-06-29 (×6): 4 mg via INTRAVENOUS
  Administered 2013-06-29: 2 mg via INTRAVENOUS
  Administered 2013-06-30 – 2013-07-01 (×6): 4 mg via INTRAVENOUS
  Administered 2013-07-01 (×2): 2 mg via INTRAVENOUS
  Administered 2013-07-01 – 2013-07-02 (×5): 4 mg via INTRAVENOUS
  Administered 2013-07-02: 2 mg via INTRAVENOUS
  Administered 2013-07-02 – 2013-07-03 (×6): 4 mg via INTRAVENOUS
  Filled 2013-06-28: qty 1
  Filled 2013-06-28 (×3): qty 2
  Filled 2013-06-28: qty 1
  Filled 2013-06-28 (×9): qty 2
  Filled 2013-06-28: qty 1
  Filled 2013-06-28 (×2): qty 2
  Filled 2013-06-28: qty 1
  Filled 2013-06-28 (×10): qty 2

## 2013-06-28 MED ORDER — NICOTINE 14 MG/24HR TD PT24
14.0000 mg | MEDICATED_PATCH | Freq: Every day | TRANSDERMAL | Status: DC
  Administered 2013-06-29 – 2013-07-03 (×5): 14 mg via TRANSDERMAL
  Filled 2013-06-28 (×6): qty 1

## 2013-06-28 SURGICAL SUPPLY — 46 items
APPLICATOR COTTON TIP 6IN STRL (MISCELLANEOUS) ×4 IMPLANT
BAG BILE SMALL  12/CS (BAG) ×1 IMPLANT
BLADE EXTENDED COATED 6.5IN (ELECTRODE) ×2 IMPLANT
BLADE HEX COATED 2.75 (ELECTRODE) ×2 IMPLANT
CANISTER SUCTION 2500CC (MISCELLANEOUS) ×2 IMPLANT
CATH ROBINSON RED A/P 14FR (CATHETERS) ×1 IMPLANT
CHLORAPREP W/TINT 26ML (MISCELLANEOUS) ×2 IMPLANT
CLOTH BEACON ORANGE TIMEOUT ST (SAFETY) ×2 IMPLANT
COVER MAYO STAND STRL (DRAPES) IMPLANT
DRAPE LAPAROSCOPIC ABDOMINAL (DRAPES) ×2 IMPLANT
DRAPE UTILITY XL STRL (DRAPES) ×2 IMPLANT
DRAPE WARM FLUID 44X44 (DRAPE) IMPLANT
DRSG PAD ABDOMINAL 8X10 ST (GAUZE/BANDAGES/DRESSINGS) ×1 IMPLANT
ELECT REM PT RETURN 9FT ADLT (ELECTROSURGICAL) ×2
ELECTRODE REM PT RTRN 9FT ADLT (ELECTROSURGICAL) ×1 IMPLANT
GLOVE BIOGEL M STRL SZ7.5 (GLOVE) IMPLANT
GLOVE BIOGEL PI IND STRL 6.5 (GLOVE) IMPLANT
GLOVE BIOGEL PI IND STRL 7.0 (GLOVE) ×1 IMPLANT
GLOVE BIOGEL PI INDICATOR 6.5 (GLOVE) ×1
GLOVE BIOGEL PI INDICATOR 7.0 (GLOVE) ×2
GLOVE ECLIPSE 7.5 STRL STRAW (GLOVE) ×3 IMPLANT
GLOVE INDICATOR 8.0 STRL GRN (GLOVE) ×2 IMPLANT
GOWN PREVENTION PLUS LG XLONG (DISPOSABLE) ×2 IMPLANT
GOWN STRL REIN XL XLG (GOWN DISPOSABLE) ×4 IMPLANT
KIT BASIN OR (CUSTOM PROCEDURE TRAY) ×2 IMPLANT
NS IRRIG 1000ML POUR BTL (IV SOLUTION) ×2 IMPLANT
PACK GENERAL/GYN (CUSTOM PROCEDURE TRAY) ×2 IMPLANT
SPONGE DRAIN TRACH 4X4 STRL 2S (GAUZE/BANDAGES/DRESSINGS) ×1 IMPLANT
SPONGE GAUZE 4X4 12PLY (GAUZE/BANDAGES/DRESSINGS) ×2 IMPLANT
SPONGE LAP 18X18 X RAY DECT (DISPOSABLE) IMPLANT
STAPLER VISISTAT 35W (STAPLE) ×2 IMPLANT
SUCTION POOLE TIP (SUCTIONS) ×1 IMPLANT
SUT NYLON 3 0 (SUTURE) ×1 IMPLANT
SUT PDS AB 1 CTX 36 (SUTURE) ×2 IMPLANT
SUT SILK 2 0 (SUTURE) ×2
SUT SILK 2 0 SH (SUTURE) ×1 IMPLANT
SUT SILK 2 0 SH CR/8 (SUTURE) ×1 IMPLANT
SUT SILK 2-0 18XBRD TIE 12 (SUTURE) IMPLANT
SUT SILK 3 0 (SUTURE) ×2
SUT SILK 3 0 SH CR/8 (SUTURE) ×1 IMPLANT
SUT SILK 3-0 18XBRD TIE 12 (SUTURE) IMPLANT
TOWEL OR 17X26 10 PK STRL BLUE (TOWEL DISPOSABLE) ×4 IMPLANT
TRAY FOLEY CATH 14FRSI W/METER (CATHETERS) IMPLANT
TRAY FOLEY METER SIL LF 16FR (CATHETERS) ×1 IMPLANT
WATER STERILE IRR 1500ML POUR (IV SOLUTION) ×2 IMPLANT
YANKAUER SUCT BULB TIP NO VENT (SUCTIONS) IMPLANT

## 2013-06-28 NOTE — Progress Notes (Signed)
[facility] PROGRESS NOTE   BROADY LAFOY WUJ:[id number] DOB: 1956-04-18 DOA: 06/27/2013 PCP: Buckner Malta, MD  Brief narrative: Henry Leonard is an 57 y.o. 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 Lenis Noon at [facility] for surgical resection after he completes his chemotherapy (had 1st chemo 06/22/13) Henry Leonard (has had 3 treatments so far), who was referred as a direct admission on 06/27/2013, by Dr. Dayton Scrape after he refused his radiation treatment today, secondary to concerns for dehydration. He has been seen and evaluated by surgery with plans to proceed with J-tube placement once cleared by Dr. Lenis Noon at [facility].  Assessment/Plan: Principal Problem:  Dehydration secondary to inability to take in by mouth food/liquids  The patient was admitted and started on IV fluids. Electrolytes have been monitored closely, and are stable. Further plans for J-tube as outlined below. Active Problems:  Leucocytosis  Repeat CBC shows improvement in WBC from 21.3---> 12.2. No evidence of infectious process. 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 Dr. Andrey Campanile has evaluated him for this purpose. He is extremely malnourished. Case discussed with Dr. Truett Perna who saw him today. Continue radiation and weekly Taxol/carboplatin as outlined in his note. Anemia of chronic disease  Hemoglobin stable. No current indication for transfusion. 2 g drop in hemoglobin likely dilutional. Continue to monitor and transfuse as needed for hemoglobin less than 7 mg/dL. Protein-calorie malnutrition, severe  Dietitian consultation  requested. Surgical consultation for placement of a surgical J-tube. We'll need to initiate tube feedings once tube placed. Tobacco abuse  Counseled. Will order a nicotine patch per patient request.  Code Status: Full.  Family Communication: No family at the bedside.  Disposition Plan: Home when stable.   IV access:  Port-A-Cath right chest wall.  Medical Consultants:  Dr. Mancel Bale, Oncology  Dr. Gaynelle Adu, Surgery  Other Consultants:  Dietitian  Anti-infectives:  None.  HPI/Subjective: Henry Leonard is having a little bit of left upper quadrant abdominal discomfort. No nausea or vomiting overnight. Low-grade fever noted.  Objective: Filed Vitals:   06/27/13 1500 06/27/13 2124 06/28/13 0225 06/28/13 0600  BP: 99/63 95/67 97/63  94/58  Pulse: 115 116 115 105  Temp: 98 F (36.7 C) 100.1 F (37.8 C) 97.8 F (36.6 C) 97.6 F (36.4 C)  TempSrc: Axillary Axillary Axillary Axillary  Resp: 20 20 20 20   Height: 5\' 6"  (1.676 m)     Weight: 47.628 kg (105 lb)     SpO2: 100% 99% 100% 98%    Intake/Output Summary (Last 24 hours) at 06/28/13 0959 Last data filed at 06/28/13 0600  Gross per 24 hour  Intake   1715 ml  Output    200 ml  Net   1515 ml    Exam: Gen:  NAD Cardiovascular:  RRR, No M/R/G Respiratory:  Lungs diminished throughout Gastrointestinal:  Abdomen soft, NT/ND, + BS Extremities:  No C/E/C  Data Reviewed: asic Metabolic Panel:  Recent Labs Lab 06/22/13 1320 06/23/13 1125 06/27/13 1745 06/28/13 0505  NA 139 140 134* 137  K 4.2 4.1 3.8 3.9  CL  --   --  100 104  CO2 24 20* 25 26  GLUCOSE 122 104 116* 115*  BUN  21.9 26.4* 20 16  CREATININE 0.6* 0.7 0.34* 0.35*  CALCIUM 10.5* 10.6* 9.0 8.8  MG  --   --  1.8  --   PHOS  --   --  2.9  --    GFR Estimated Creatinine Clearance: 70.2 ml/min (by C-G formula based on Cr of 0.35). Liver Function Tests:  Recent Labs Lab 06/22/13 1320 06/23/13 1125  AST 13 17  ALT 6 8  ALKPHOS 67  64  BILITOT 0.30 0.36  PROT 7.6 7.7  ALBUMIN 3.6 3.7   Coagulation profile  Recent Labs Lab 06/21/13 1130  INR 1.09    CBC:  Recent Labs Lab 06/21/13 1130 06/22/13 1321 06/23/13 1125 06/27/13 1745  WBC 13.6* 10.6* 21.2* 12.4*  NEUTROABS  --  10.1* 19.5* 10.9*  HGB 10.4* 9.8* 10.8* 7.8*  HCT 32.2* 29.5* 33.8* 24.1*  MCV 87.0 84.8 86.4 85.8  PLT 521* 455* 520* 365    Procedures and Diagnostic Studies: None.   Scheduled Meds: . ciprofloxacin  400 mg Intravenous On Call to OR   Continuous Infusions: . dextrose 5 % and 0.9% NaCl 100 mL/hr at 06/28/13 0302    Time spent: 35 minutes with > 50% of time discussing current diagnostic test results, clinical impression and plan of care.    LOS: 1 day   Henry Leonard  [facility] Pager [phone].   *Please note that the hospitalists switch teams on Wednesdays. Please call the flow manager at [phone] if you are having difficulty reaching the hospitalist taking care of this patient as she can update you and provide the most up-to-date pager number of provider caring for the patient. If 8PM-8AM, please contact night-coverage at [email or web address], password [facility]  06/28/2013, 9:59 AM     Information printed out, explained, and given to the patient:  In an effort to keep you and your family informed about your hospital stay, I am providing you with this information sheet. If you or your family have any questions, please do not hesitate to have the nursing staff page me to set up a meeting time.  Henry Leonard 06/28/2013 1 (Number of days in the hospital)  Treatment team:  Dr. Hillery Aldo, Hospitalist (Internist)  Dr. Mancel Bale (Oncologist)  Dr. Gaynelle Adu (Surgeon)  Active Treatment Issues with Plan: Principal Problem:  Dehydration secondary to inability to take in by mouth food/liquids  You were admitted and started on IV fluids. Electrolytes have been monitored closely, and are stable. Further  plans for J-tube as outlined below. Active Problems:  Esophageal cancer with trouble swallowing  You will need to have a surgical J-tube placed for nutritional purposes. Dr. Andrey Campanile has evaluated you for this purpose. Continue radiation and weekly Taxol/carboplatin as recommended by Dr. Truett Perna. Anemia (low red blood cell count)  Hemoglobin stable. No current indication for transfusion.  Malnourishment  Dietitian consultation requested. Surgical consultation for placement of a surgical J-tube. We'll need to initiate tube feedings once tube placed. Tobacco abuse  We strongly recommend that you discontinue smoking.   Anticipated discharge date: 2-3 days.

## 2013-06-28 NOTE — Transfer of Care (Signed)
Immediate Anesthesia Transfer of Care Note  Patient: Henry Leonard  Procedure(s) Performed: Procedure(s): OPEN JEJUNOSTOMY TUBE PLACEMENT (N/A)  Patient Location: PACU  Anesthesia Type:General  Level of Consciousness: Patient easily awoken, sedated, comfortable, cooperative, following commands, responds to stimulation.   Airway & Oxygen Therapy: Patient spontaneously breathing, ventilating well, oxygen via simple oxygen mask.  Post-op Assessment: Report given to PACU RN, vital signs reviewed and stable, moving all extremities.   Post vital signs: Reviewed and stable.  Complications: No apparent anesthesia complications

## 2013-06-28 NOTE — H&P (View-Only) (Signed)
Reason for Consult:placement of surgical J tube Referring Physician: Dr C. Rama  Henry Leonard is an 57 y.o. male.  HPI: Henry Leonard is an 57 y.o. 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 Levine at [facility] for surgical resection after he completes his chemotherapy (had 1st chemo 06/22/13) /radiation (has had 3 treatments so far), who was referred as a direct admission by Dr. Murray after he refused his radiation treatment today, secondary to concerns for dehydration. Mr. Henry Leonard has not been able to take in much PO, he is on a liquid diet secondary to dysphagia. He has been unable to consistently take in POs, no complaints of frank vomiting but has refluxing of liquids up after attempts to drink. He has only been able to take in 2 cans of Osmolite a day with minimal fluid intake otherwise. Dr. Sherrill suggests a surgical referral for placement of a surgical J tube since, if he tolerates current treatment, the plan is to pursue esophagectomy. Oral intake aggravates his symptoms, no alleviating factors other than not attempting to drink.   [facility] was consulted by Dr Rama for surgical J tube. Prior to starting this round of chemo/xrt - pt was drinking 5-6 shakes during the day in the summer before he was dx with esophageal cancer   Past Medical History  Diagnosis Date  . Tobacco abuse   . Leucocytosis 11/01/11  . Dysphagia   . Cancer of tongue 3/16/ 13    Associated Neck Nodes; T2N2  . Seasonal allergies   . S/P radiation therapy 02/24/12 - 04/06/12    Surgical bed and bilateral neck / 60 Gy / 30 Fractions VMAT Plan  . Status post chemotherapy 02/24/12 -03/2012     Cisplatin Concurrently with Radiation  Therapy  . Esophageal cancer 05/11/13    T3 Squamous Cell Carcinoma,  Invasive Well differentiated  . Edentulous     Past Surgical History  Procedure Laterality Date  . Biopsy tongue  11/12/11    Tongue - Squamous Cell Cancer  . Glossectomy  01/19/12    with B/l neck dissection; radial forearm free flap  . Multiple tooth extractions  11/2011  . Feeding tube insertion and removal  11/2011 inserted removed 10/2012  . Tracheostomy  11/2011  . Tracheal closed stent removal w/ mlb  11/20/2102    Family History  Problem Relation Age of Onset  . Esophageal cancer Father   . Seizures Mother     Social History:  reports that he has been smoking Cigarettes.  He has a 52.5 pack-year smoking history. He has never used smokeless tobacco. He reports that he drinks alcohol. He reports that he does not use illicit drugs.  Allergies:  Allergies  Allergen Reactions  . Penicillins Rash    Medications: I have reviewed the patient's current medications.  No results found for this or any previous visit (from the past 48 hour(s)).  No results found.  Review of Systems  Constitutional: Positive for weight loss. Negative for fever and chills.  HENT: Negative for nosebleeds.        Tongue cancer last summer s/p resection, chemo/xrt; +dry mouth   Eyes: Negative for blurred vision.  Respiratory: Negative for shortness of breath.   Cardiovascular: Negative for chest pain, palpitations,   orthopnea, leg swelling and PND.       Denies DOE  Gastrointestinal: Positive for nausea and constipation. Negative for vomiting.       Upper abd gas pain; intermittent episodes of regurgitation  Genitourinary: Negative for dysuria and hematuria.  Musculoskeletal: Negative.   Skin: Negative for itching and rash.  Neurological: Positive for weakness. Negative for dizziness, focal weakness, seizures, loss of consciousness and headaches.       Denies TIAs, amaurosis fugax  Endo/Heme/Allergies: Does not bruise/bleed easily.   Psychiatric/Behavioral: Positive for depression. The patient is not nervous/anxious.    Blood pressure 99/63, pulse 115, temperature 98 F (36.7 C), temperature source Axillary, resp. rate 20, height 5' 6" (1.676 m), weight 105 lb (47.628 kg), SpO2 100.00%. Physical Exam  Vitals reviewed. Constitutional: He is oriented to person, place, and time. He appears well-developed. No distress.  HENT:  Head: Normocephalic and atraumatic.  Right Ear: External ear normal.  Left Ear: External ear normal.  Eyes: Conjunctivae are normal. No scleral icterus.  Neck: Normal range of motion. Neck supple. No tracheal deviation present. No thyromegaly present.  Well healed scar  Cardiovascular: Normal rate and normal heart sounds.   Respiratory: Effort normal and breath sounds normal. No stridor. No respiratory distress. He has no wheezes.  GI: Soft. He exhibits no distension. There is no tenderness. There is no rebound.  Old g tube site  Musculoskeletal: He exhibits no edema and no tenderness.  Lymphadenopathy:    He has no cervical adenopathy.  Neurological: He is alert and oriented to person, place, and time. He exhibits normal muscle tone.  Skin: Skin is warm and dry. No rash noted. He is not diaphoretic. No erythema. No pallor.  Psychiatric: He has a normal mood and affect. His behavior is normal. Judgment and thought content normal.    Assessment/Plan: Esophageal/upper stomach cancer T3N1 H/o tongue cancer Tobacco use Dehydration  Leukocytosis - due to dehyrdation? PCMN  Will call Dr Levine at [facility] in am and discuss case with him to make sure they are ok with us doing it.  Discussed risk/benefits of surgical J tube.  Cont IVF resuscitation Npo at 0300 just in case we proceed with j tube in AM  Henry Berdan M. Jerritt Cardoza, MD, FACS General, Bariatric, & Minimally Invasive Surgery [facility]    Henry Leonard M 06/27/2013, 5:28 PM      

## 2013-06-28 NOTE — Anesthesia Preprocedure Evaluation (Addendum)
Anesthesia Evaluation  Patient identified by MRN, date of birth, ID band Patient awake    Reviewed: Allergy & Precautions, H&P , NPO status , Patient's Chart, lab work & pertinent test results  Airway Mallampati: II TM Distance: >3 FB Neck ROM: full    Dental  (+) Edentulous Upper and Edentulous Lower   Pulmonary neg pulmonary ROS, Current Smoker,  breath sounds clear to auscultation  Pulmonary exam normal       Cardiovascular Exercise Tolerance: Good negative cardio ROS  Rhythm:regular Rate:Normal     Neuro/Psych negative neurological ROS  negative psych ROS   GI/Hepatic negative GI ROS, Neg liver ROS, Tongue and esophageal Ca   Endo/Other  negative endocrine ROS  Renal/GU negative Renal ROS  negative genitourinary   Musculoskeletal   Abdominal   Peds  Hematology negative hematology ROS (+) anemia ,   Anesthesia Other Findings   Reproductive/Obstetrics negative OB ROS                          Anesthesia Physical Anesthesia Plan  ASA: III  Anesthesia Plan: General   Post-op Pain Management:    Induction: Intravenous  Airway Management Planned: Oral ETT  Additional Equipment:   Intra-op Plan:   Post-operative Plan: Extubation in OR  Informed Consent: I have reviewed the patients History and Physical, chart, labs and discussed the procedure including the risks, benefits and alternatives for the proposed anesthesia with the patient or authorized representative who has indicated his/her understanding and acceptance.   Dental Advisory Given  Plan Discussed with: CRNA and Surgeon  Anesthesia Plan Comments:         Anesthesia Quick Evaluation

## 2013-06-28 NOTE — Preoperative (Signed)
Beta Blockers   Reason not to administer Beta Blockers:Not Applicable, not on home BB 

## 2013-06-28 NOTE — Interval H&P Note (Signed)
History and Physical Interval Note:  06/28/2013 11:11 AM  Alphonse Guild  has presented today for surgery, with the diagnosis of dysphasia  The various methods of treatment have been discussed with the patient and family. After consideration of risks, benefits and other options for treatment, the patient has consented to  OPEN JEJUNOSTOMY TUBE PLACEMENT as a surgical intervention .  The patient's history has been reviewed, patient examined, no change in status, stable for surgery.  I have reviewed the patient's chart and labs.  Questions were answered to the patient's satisfaction.    Dr Lenis Noon at [facility] on vacation this week. Discussed with on-call partner at [facility] - Dr Flonnie Hailstone - who advised [name] to go ahead and place J tube.   Discussed risk/benefits with pt yesterday evening and again this am -   We discussed the risks and benefits of surgery including, but not limited to bleeding, infection (such as wound infection, abdominal abscess), injury to surrounding structures, blood clot formation, urinary retention, incisional hernia, tube site infection, tube dislodgement, tube clogging, anesthesia risks, pulmonary & cardiac complications such as pneumonia &/or heart attack, need for additional procedures, ileus, & prolonged hospitalization.  We discussed the typical postoperative recovery course, including limitations & restrictions postoperatively. I explained that the likelihood of improvement in their symptoms is good.  Mary Sella. Andrey Campanile, MD, FACS General, Bariatric, & Minimally Invasive Surgery [facility], [place]   [facility] M

## 2013-06-28 NOTE — Brief Op Note (Signed)
06/27/2013 - 06/28/2013  1:33 PM  PATIENT:  Henry Leonard  57 y.o. male  PRE-OPERATIVE DIAGNOSIS:  Dysphagia; esophageal cancer; protein calorie malnutrition  POST-OPERATIVE DIAGNOSIS:  same  PROCEDURE:  Procedure(s): OPEN JEJUNOSTOMY TUBE PLACEMENT (N/A)  SURGEON:  Surgeon(s) and Role:    * Atilano Ina, MD - Primary  PHYSICIAN ASSISTANT:   ASSISTANTS: none   ANESTHESIA:   general  EBL:  Total I/O In: 1400 [I.V.:1400] Out: 350 [Urine:350]  BLOOD ADMINISTERED:none  DRAINS: Jejunostomy Tube 14Fr red rubber  LOCAL MEDICATIONS USED:  NONE  SPECIMEN:  No Specimen  DISPOSITION OF SPECIMEN:  N/A  COUNTS:  YES  TOURNIQUET:  * No tourniquets in log *  DICTATION: .Other Dictation: Dictation Number 0  PLAN OF CARE: PACU then return to floor  PATIENT DISPOSITION:  PACU - hemodynamically stable.   Delay start of Pharmacological VTE agent (>24hrs) due to surgical blood loss or risk of bleeding: no  Mary Sella. Andrey Campanile, MD, FACS General, Bariatric, & Minimally Invasive Surgery [facility], [place]

## 2013-06-28 NOTE — Care Management Note (Signed)
   CARE MANAGEMENT NOTE 06/28/2013  Patient:  Henry Leonard, Henry Leonard   Account Number:  [id number]  Date Initiated:  06/28/2013  Documentation initiated by:  Tzippy Testerman  Subjective/Objective Assessment:   57 yo male admitted with dehydration. J-tube placment 06/28/13     Action/Plan:   Home when stable   Anticipated DC Date:     Anticipated DC Plan:  HOME/SELF CARE      DC Planning Services  CM consult      Choice offered to / List presented to:  NA   DME arranged  NA      DME agency  NA     HH arranged  NA      HH agency  NA   Status of service:  In process, will continue to follow Medicare Important Message given?   (If response is "NO", the following Medicare IM given date fields will be blank) Date Medicare IM given:   Date Additional Medicare IM given:    Discharge Disposition:    Per UR Regulation:  Reviewed for med. necessity/level of care/duration of stay  If discussed at Long Length of Stay Meetings, dates discussed:    Comments:  06/28/13 1143 Makyiah Lie,RN,MSN [phone] Chart reviewed for utilization of services. No needs identified at this time. Will continue to follow post surgical intervention.

## 2013-06-28 NOTE — Progress Notes (Signed)
See my updated note from yesterday.  To OR for open J tube. Risk/benefits/alternative options discussed with pt who agrees with proceeding.   Henry Leonard. Andrey Campanile, MD, FACS General, Bariatric, & Minimally Invasive Surgery [facility], [place]

## 2013-06-28 NOTE — OR Nursing (Signed)
Attempted to place 16 French foley, met resistance, stopped and notified Dr. Andrey Campanile.

## 2013-06-28 NOTE — Progress Notes (Signed)
Received notice that Henry Leonard is refusing radiation therapy today.  Informed therapist on Machine [phone] and informed Dr. Mitzi Hansen.  Will check his status on tomorrow.

## 2013-06-28 NOTE — Anesthesia Postprocedure Evaluation (Signed)
  Anesthesia Post-op Note  Patient: Henry Leonard  Procedure(s) Performed: Procedure(s) (LRB): OPEN JEJUNOSTOMY TUBE PLACEMENT (N/A)  Patient Location: PACU  Anesthesia Type: General  Level of Consciousness: awake and alert   Airway and Oxygen Therapy: Patient Spontanous Breathing  Post-op Pain: mild  Post-op Assessment: Post-op Vital signs reviewed, Patient's Cardiovascular Status Stable, Respiratory Function Stable, Patent Airway and No signs of Nausea or vomiting  Last Vitals:  Filed Vitals:   06/28/13 1430  BP: 131/82  Pulse: 123  Temp: 36.7 C  Resp: 12    Post-op Vital Signs: stable   Complications: No apparent anesthesia complications

## 2013-06-28 NOTE — Progress Notes (Signed)
  Subjective: Feels okay.  Hasn't eaten much since here.  Throat sore.  Wants to have surgery.  No other complaints.    Objective: Vital signs in last 24 hours: Temp:  [97.6 F (36.4 C)-100.1 F (37.8 C)] 97.6 F (36.4 C) (11/11 0600) Pulse Rate:  [105-135] 105 (11/11 0600) Resp:  [20] 20 (11/11 0600) BP: (91-99)/(58-68) 94/58 mmHg (11/11 0600) SpO2:  [98 %-100 %] 98 % (11/11 0600) Weight:  [105 lb (47.628 kg)] 105 lb (47.628 kg) (11/10 1500) Last BM Date: 06/26/13  Intake/Output from previous day: 11/10 0701 - 11/11 0700 In: 1715 [P.O.:290; I.V.:1425] Out: 200 [Urine:200] Intake/Output this shift:    PE: Gen:  Alert, NAD, pleasant Abd: Thin, soft, NT/ND, +BS, no HSM, one LUQ scar noted from previous feeding tube site which is well healed, no other surgical scars noted  Lab Results:   Recent Labs  06/27/13 1745  WBC 12.4*  HGB 7.8*  HCT 24.1*  PLT 365   BMET  Recent Labs  06/27/13 1745 06/28/13 0505  NA 134* 137  K 3.8 3.9  CL 100 104  CO2 25 26  GLUCOSE 116* 115*  BUN 20 16  CREATININE 0.34* 0.35*  CALCIUM 9.0 8.8   PT/INR No results found for this basename: LABPROT, INR,  in the last 72 hours CMP     Component Value Date/Time   NA 137 06/28/2013 0505   NA 140 06/23/2013 1125   K 3.9 06/28/2013 0505   K 4.1 06/23/2013 1125   CL 104 06/28/2013 0505   CO2 26 06/28/2013 0505   CO2 20* 06/23/2013 1125   GLUCOSE 115* 06/28/2013 0505   GLUCOSE 104 06/23/2013 1125   BUN 16 06/28/2013 0505   BUN 26.4* 06/23/2013 1125   CREATININE 0.35* 06/28/2013 0505   CREATININE 0.7 06/23/2013 1125   CALCIUM 8.8 06/28/2013 0505   CALCIUM 10.6* 06/23/2013 1125   PROT 7.7 06/23/2013 1125   PROT 8.2 12/04/2011 1505   ALBUMIN 3.7 06/23/2013 1125   ALBUMIN 3.6 12/04/2011 1505   AST 17 06/23/2013 1125   AST 14 12/04/2011 1505   ALT 8 06/23/2013 1125   ALT 8 12/04/2011 1505   ALKPHOS 64 06/23/2013 1125   ALKPHOS 58 12/04/2011 1505   BILITOT 0.36 06/23/2013 1125   BILITOT 0.2*  12/04/2011 1505   GFRNONAA >90 06/28/2013 0505   GFRAA >90 06/28/2013 0505   Lipase  No results found for this basename: lipase       Studies/Results: No results found.  Anti-infectives: Anti-infectives   None       Assessment/Plan Carcinoma of the distal third of the esophagus/upper stomach H/o Tongue cancer Dysphagia Dehydration Protein-calorie malnutrition, severe Ongoing tobacco use H/o alcohol use  Plan: 1.  Dr. Andrey Campanile is trying to get a hold of his surg onc (Dr. Lenis Noon) at [facility] to see if transfer there for J-tube is preferred for continuity of care.  Otherwise we would work on scheduling the procedure here.   2.  Continue NPO for now until further plans are made.   3.  IVF, pain control 4.  The patient is understanding of the plan and would like to proceed with J tube with either surgeon.    LOS: 1 day    Henry Leonard 06/28/2013, 8:19 AM Pager: [phone]

## 2013-06-28 NOTE — Progress Notes (Addendum)
IP PROGRESS NOTE  Subjective:   Mr. Mawson was admitted yesterday with solid/liquid dysphasia. He completed cycle 1 of weekly Taxol/carboplatin chemotherapy on 06/22/2013. He tolerated the chemotherapy well. He denies nausea and symptoms of an allergic reaction. He began radiation on 06/22/2013.  He complains of feeling like liquids and food or "sticking "in the esophagus.    Objective: Vital signs in last 24 hours: Blood pressure 94/58, pulse 105, temperature 97.6 F (36.4 C), temperature source Axillary, resp. rate 20, height 5\' 6"  (1.676 m), weight 105 lb (47.628 kg), SpO2 98.00%.  Intake/Output from previous day: 11/10 0701 - 11/11 0700 In: 1715 [P.O.:290; I.V.:1425] Out: 200 [Urine:200]  Physical Exam:  HEENT: The mucous membranes are dry. No thrush. Lungs: Good air movement bilaterally, end inspiratory rhonchi at the right lower posterior chest Cardiac: Regular rate and rhythm Abdomen: Soft and nontender, no hepatomegaly Extremities: No leg edema   Portacath/PICC-without erythema  Lab Results:  Recent Labs  06/27/13 1745  WBC 12.4*  HGB 7.8*  HCT 24.1*  PLT 365    BMET  Recent Labs  06/27/13 1745 06/28/13 0505  NA 134* 137  K 3.8 3.9  CL 100 104  CO2 25 26  GLUCOSE 116* 115*  BUN 20 16  CREATININE 0.34* 0.35*  CALCIUM 9.0 8.8   ANC 10.9 on 06/27/2013  Studies/Results: No results found.  Medications: I have reviewed the patient's current medications.  Assessment/Plan:  1. 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 Spiculated left lower lobe nodule with low level FDG activity on the staging PET scan 05/26/2013  3. Tongue cancer 2013,[id number], status  post Erlotonib, glossectomy/neck dissection, and adjuvant cisplatin/radiation in 2013  4. Ongoing tobacco use  5. History of alcohol use 6. Solid/liquid dysphasia secondary to #1 7. Malnutrition-he has been seen in consultation by Dr. Andrey Campanile and the plan is to place a feeding jejunostomy tube  Recommendations: 1. Proceed with feeding tube placement by Dr. Andrey Campanile  2. Continue radiation and weekly Taxol/carboplatin when feeding tube is in place  I will discuss the case with Dr. Andrey Campanile regarding the resumption of chemotherapy and radiation.    LOS: 1 day   Henry Leonard  06/28/2013, 8:12 AM

## 2013-06-29 ENCOUNTER — Other Ambulatory Visit: Payer: Self-pay | Admitting: Lab

## 2013-06-29 ENCOUNTER — Ambulatory Visit: Payer: Medicaid Other

## 2013-06-29 ENCOUNTER — Ambulatory Visit: Payer: Self-pay

## 2013-06-29 ENCOUNTER — Encounter (HOSPITAL_COMMUNITY): Payer: Self-pay | Admitting: General Surgery

## 2013-06-29 ENCOUNTER — Encounter: Payer: Self-pay | Admitting: Nutrition

## 2013-06-29 ENCOUNTER — Ambulatory Visit: Payer: Self-pay | Admitting: Oncology

## 2013-06-29 LAB — CBC
HCT: 24.8 % — ABNORMAL LOW (ref 39.0–52.0)
MCHC: 32.7 g/dL (ref 30.0–36.0)
MCV: 84.9 fL (ref 78.0–100.0)
Platelets: 439 10*3/uL — ABNORMAL HIGH (ref 150–400)
RBC: 2.92 MIL/uL — ABNORMAL LOW (ref 4.22–5.81)
WBC: 14.1 10*3/uL — ABNORMAL HIGH (ref 4.0–10.5)

## 2013-06-29 LAB — BASIC METABOLIC PANEL
CO2: 28 mEq/L (ref 19–32)
Chloride: 97 mEq/L (ref 96–112)
Creatinine, Ser: 0.34 mg/dL — ABNORMAL LOW (ref 0.50–1.35)
GFR calc non Af Amer: >90 mL/min (ref >90)
Potassium: 4 mEq/L (ref 3.5–5.1)

## 2013-06-29 LAB — GLUCOSE, CAPILLARY
Glucose-Capillary: 115 mg/dL — ABNORMAL HIGH (ref 70–99)
Glucose-Capillary: 111 mg/dL — ABNORMAL HIGH (ref 70–99)

## 2013-06-29 MED ORDER — ENOXAPARIN SODIUM 40 MG/0.4ML ~~LOC~~ SOLN
40.0000 mg | SUBCUTANEOUS | Status: DC
  Administered 2013-06-29 – 2013-07-03 (×5): 40 mg via SUBCUTANEOUS
  Filled 2013-06-29 (×5): qty 0.4

## 2013-06-29 MED ORDER — OSMOLITE 1.5 CAL PO LIQD
1000.0000 mL | ORAL | Status: DC
  Filled 2013-06-29: qty 1000

## 2013-06-29 MED ORDER — OSMOLITE 1.5 CAL PO LIQD
1000.0000 mL | ORAL | Status: DC
  Administered 2013-06-29: 1000 mL
  Filled 2013-06-29 (×2): qty 1000

## 2013-06-29 NOTE — Progress Notes (Signed)
[facility] PROGRESS NOTE  Henry Leonard XBJ:[id number] DOB: Feb 07, 1956 DOA: 06/27/2013 PCP: Buckner Malta, MD  Brief narrative: 37 year oldmale with past medical history of locally advanced tongue cancer diagnosed in 2013, status post induction Erlotinib followed by surgery and adjuvant chemotherapy with cisplatin and concurrent radiation, T3 esophageal mass with biopsy results confirming well-differentiated squamous cell carcinoma undergoing radiation therapy with plans to followup with Dr. Lenis Noon at [facility] for surgical resection after he completes his chemotherapy (had 1st chemo 06/22/13) Henry Leonard (has had 3 treatments so far), who was referred as a direct admission on 06/27/2013, by Dr. Dayton Scrape after he declined having his radiation treatment, secondary to concerns for dehydration. He has been seen and evaluated by surgery and has had J tube placed with no subsequent complications.    Assessment/Plan:   Principal Problem:  Dehydration secondary to inability to tolerate PO intake  - J tube placed and TF can be started per surgery; po intake as pt tolerates  Active Problems:  Carcinoma of distal third of esophagus / dysphasia  - had J tube placed and TF can start per surgery - Continue radiation and weekly Taxol/carboplatin as pre oncology management Leucocytosis  - WBC count trending up from 12.4 to 14.1 - no fevers - will continue to monitor fever curve Anemia of chronic disease  - related to history of malignancy and sequela of chemotherapy - transfuse if Hgb less than 7; last Hgb 8.1 Protein-calorie malnutrition, severe  - Dietitian consultation requested. Now has J tube - PO intake as tolerated Tobacco abuse  - Counseled. Ordered nicotine patch  Code Status: Full.  Family Communication: No family at the bedside.  Disposition Plan: Home when stable.   IV access:  Port-A-Cath right chest wall. Medical Consultants:  Dr. Mancel Bale, Oncology  Dr. Gaynelle Adu, Surgery Other Consultants:  Dietitian Anti-infectives:  None.   Manson Passey, MD  [facility] Pager [phone]  If 7PM-7AM, please contact night-coverage [email or web address] Password [facility] 06/29/2013, 4:38 PM   LOS: 2 days    HPI/Subjective: No overnight events.  Objective: Filed Vitals:   06/28/13 1505 06/28/13 2130 06/29/13 0511 06/29/13 1338  BP: 122/79 128/83 131/90 126/88  Pulse: 127 117 109 116  Temp: 97.9 F (36.6 C) 97.5 F (36.4 C) 97.8 F (36.6 C) 97.9 F (36.6 C)  TempSrc:  Axillary Axillary Axillary  Resp: 16 18 16 16   Height:      Weight:      SpO2: 100% 100% 100% 99%    Intake/Output Summary (Last 24 hours) at 06/29/13 1638 Last data filed at 06/29/13 1427  Gross per 24 hour  Intake 3389.67 ml  Output   1150 ml  Net 2239.67 ml    Exam:   General:  Pt is alert, follows commands appropriately, not in acute distress  Cardiovascular: Regular rate and rhythm, S1/S2 appreciated  Respiratory: Clear to auscultation bilaterally, no wheezing, no crackles, no rhonchi  Abdomen: Soft, non tender, non distended, J- tube in place  Extremities: No edema, pulses DP and PT palpable bilaterally  Neuro: Grossly nonfocal  Data Reviewed: Basic Metabolic Panel:  Recent Labs Lab 06/23/13 1125 06/27/13 1745 06/28/13 0505 06/29/13 0445  NA 140 134* 137 132*  K 4.1 3.8 3.9 4.0  CL  --  100 104 97  CO2 20* 25 26 28   GLUCOSE 104 116* 115* 128*  BUN 26.4* 20 16 7   CREATININE 0.7 0.34* 0.35* 0.34*  CALCIUM 10.6* 9.0 8.8 9.3  MG  --  1.8  --   --   PHOS  --  2.9  --   --    Liver Function Tests:  Recent Labs Lab 06/23/13 1125  AST 17  ALT 8  ALKPHOS 64  BILITOT 0.36  PROT 7.7  ALBUMIN 3.7   No results found for this basename: LIPASE, AMYLASE,  in the last 168 hours No results found for this basename: AMMONIA,  in the last 168 hours CBC:  Recent Labs Lab 06/23/13 1125 06/27/13 1745 06/29/13 0445  WBC 21.2* 12.4* 14.1*   NEUTROABS 19.5* 10.9*  --   HGB 10.8* 7.8* 8.1*  HCT 33.8* 24.1* 24.8*  MCV 86.4 85.8 84.9  PLT 520* 365 439*   Cardiac Enzymes: No results found for this basename: CKTOTAL, CKMB, CKMBINDEX, TROPONINI,  in the last 168 hours BNP: No components found with this basename: POCBNP,  CBG: No results found for this basename: GLUCAP,  in the last 168 hours  Recent Results (from the past 240 hour(s))  SURGICAL PCR SCREEN     Status: None   Collection Time    06/28/13 10:46 AM      Result Value Range Status   MRSA, PCR NEGATIVE  NEGATIVE Final   Staphylococcus aureus NEGATIVE  NEGATIVE Final   Comment:            The Xpert SA Assay (FDA     approved for NASAL specimens     in patients over 69 years of age),     is one component of     a comprehensive surveillance     program.  Test performance has     been validated by [employer] for patients greater     than or equal to 62 year old.     It is not intended     to diagnose infection nor to     guide or monitor treatment.     Studies: No results found.  Scheduled Meds: . enoxaparin (LOVENOX) injection  40 mg Subcutaneous Q24H  . feeding supplement (OSMOLITE 1.5 CAL)  1,000 mL Per Tube Q24H  . nicotine  14 mg Transdermal Daily   Continuous Infusions: . dextrose 5 % and 0.9% NaCl 100 mL/hr at 06/29/13 1606

## 2013-06-29 NOTE — Progress Notes (Signed)
INITIAL NUTRITION ASSESSMENT  Pt meets criteria for severe MALNUTRITION in the context of chronic illness as evidenced by <75% estimated energy intake for over 1 month with severe depletion of fat and muscle stores.  DOCUMENTATION CODES Per approved criteria  -Severe malnutrition in the context of chronic illness -Underweight   INTERVENTION: - Initiate TF of Osmolite 1.5 via J tube at 62ml/hr for the next 24 hours per surgery notes - Afterwards increase TF of Osmolite 1.5 by 10ml every 4 hours to goal of 44ml/hr which will provide 1800 calories, 75g protein, free water and meet 100% of estimated calorie and protein needs. Once IVF d/c, recommend water flushes 6 times/day.  - Initiate adult enteral protocol - Will continue to monitor   NUTRITION DIAGNOSIS: Inadequate oral intake related to clear liquid diet as evidenced by diet order.   Goal: TF tolerance with goal to meet >90% of estimated nutritional needs  Monitor:  Weights, labs, TF tolerance/advancement  Reason for Assessment: Nutrition risk, consult   57 y.o. male  Admitting Dx: Dehydration  ASSESSMENT: Pt with hx of locally advanced tongue cancer diagnosed in 2013, status post induction Erlotinib followed by surgery and adjuvant chemotherapy with cisplatin and concurrent radiation, T3 esophageal mass with biopsy results confirming well-differentiated squamous cell carcinoma undergoing radiation therapy with plans to followup with Dr. Lenis Noon at [facility] for surgical resection after he completes his chemotherapy (had 1st chemo 06/22/13) Henry Leonard (has had 3 treatments so far), who was referred as a direct admission by Dr. Dayton Scrape after he refused his radiation treatment today, secondary to concerns for dehydration. Mr. Henry Leonard has not been able to take in much PO, he is on a liquid diet secondary to dysphagia. He has been unable to consistently take in POs, no complaints of frank vomiting but has refluxing of liquids up  after attempts to drink. He has only been able to take in 2 cans of Osmolite 1.5 a day with minimal fluid intake otherwise. Dr. Truett Perna suggests a surgical referral for placement of a surgical J tube since, if he tolerates current treatment, the plan is to pursue esophagectomy. Hx of PEG from April 2013-March 2014 r/t treatment for tongue CA. Has been followed by outpatient [facility] RD. S/p open J tube placement yesterday.    Per surgery, plan is to start TF today at 6ml/hr then slowly increase to goal rate in the next 24 hours.   Met with pt who reports oral intake has been only 2-3 cans/day of Osmolite 1.5 (each can is 355 calories, 15g protein) and some water for the past 2 weeks with worsening ability to keep liquids down. States he has a blockage and sometimes the liquids would get stuck and it would come up. Also had occasional gas pain PTA.    Height: Ht Readings from Last 1 Encounters:  06/27/13 5\' 6"  (1.676 m)    Weight: Wt Readings from Last 1 Encounters:  06/27/13 105 lb (47.628 kg)    Ideal Body Weight: 142 lb   % Ideal Body Weight: 74%  Wt Readings from Last 10 Encounters:  06/27/13 105 lb (47.628 kg)  06/27/13 105 lb (47.628 kg)  06/24/13 108 lb 9.6 oz (49.261 kg)  06/14/13 112 lb 12.8 oz (51.166 kg)  06/13/13 111 lb 8 oz (50.576 kg)  06/10/13 112 lb 11.2 oz (51.12 kg)  12/04/11 130 lb (58.968 kg)  11/19/11 132 lb 8 oz (60.102 kg)  11/19/11 132 lb 8 oz (60.102 kg)  10/31/11 131 lb 9.8 oz (59.7 kg)    Usual Body Weight: 130 lb  % Usual Body Weight: 81%  BMI:  Body mass index is 16.96 kg/(m^2). Underweight  Estimated Nutritional Needs: Kcal: 1600-1800 Protein: 75-85g Fluid: 1.8L/day  Skin: J tube  Diet Order: Clear Liquid  EDUCATION NEEDS: -No education needs identified at this time   Intake/Output Summary (Last 24 hours) at 06/29/13 0849 Last data filed at 06/29/13 0500  Gross per 24 hour  Intake 5421.67 ml  Output    900 ml   Net 4521.67 ml    Last BM: 11/10   Labs:   Recent Labs Lab 06/27/13 1745 06/28/13 0505 06/29/13 0445  NA 134* 137 132*  K 3.8 3.9 4.0  CL 100 104 97  CO2 25 26 28   BUN 20 16 7   CREATININE 0.34* 0.35* 0.34*  CALCIUM 9.0 8.8 9.3  MG 1.8  --   --   PHOS 2.9  --   --   GLUCOSE 116* 115* 128*    CBG (last 3)  No results found for this basename: GLUCAP,  in the last 72 hours  Scheduled Meds: . nicotine  14 mg Transdermal Daily    Continuous Infusions: . dextrose 5 % and 0.9% NaCl 100 mL/hr at 06/29/13 0449    Past Medical History  Diagnosis Date  . Tobacco abuse   . Leucocytosis 11/01/11  . Dysphagia   . Cancer of tongue 3/16/ 13    Associated Neck Nodes; T2N2  . Seasonal allergies   . S/P radiation therapy 02/24/12 - 04/06/12    Surgical bed and bilateral neck / 60 Gy / 30 Fractions VMAT Plan  . Status post chemotherapy 02/24/12 -03/2012     Cisplatin Concurrently with Radiation Therapy  . Esophageal cancer 05/11/13    T3 Squamous Cell Carcinoma,  Invasive Well differentiated  . Edentulous     Past Surgical History  Procedure Laterality Date  . Biopsy tongue  11/12/11    Tongue - Squamous Cell Cancer  . Glossectomy  01/19/12    with B/l neck dissection; radial forearm free flap  . Multiple tooth extractions  11/2011  . Feeding tube insertion and removal  11/2011 inserted removed 10/2012  . Tracheostomy  11/2011  . Tracheal closed stent removal w/ mlb  11/20/2102    Henry Leonard, RD, LDN [phone] Pager [phone] After Hours Pager

## 2013-06-29 NOTE — Progress Notes (Signed)
1 Day Post-Op  Subjective: He is very sore this AM, says he was up and around prior to this but rather weak now.  Objective: Vital signs in last 24 hours: Temp:  [97.5 F (36.4 C)-98.6 F (37 C)] 97.8 F (36.6 C) (11/12 0511) Pulse Rate:  [104-127] 109 (11/12 0511) Resp:  [12-18] 16 (11/12 0511) BP: (120-134)/(68-94) 131/90 mmHg (11/12 0511) SpO2:  [100 %] 100 % (11/12 0511) Last BM Date: 06/27/13 PO 240  Afebrile, HR up 100-120 range BP up some 3AM Labs stable Intake/Output from previous day: 11/11 0701 - 11/12 0700 In: 5421.7 [P.O.:240; I.V.:5181.7] Out: 900 [Urine:900] Intake/Output this shift:    General appearance: alert, cooperative and no distress GI: soft, very tender after procedure, some drainage from J tube in bag.  BS hypoactive, no distension.  Lab Results:   Recent Labs  06/27/13 1745 06/29/13 0445  WBC 12.4* 14.1*  HGB 7.8* 8.1*  HCT 24.1* 24.8*  PLT 365 439*    BMET  Recent Labs  06/28/13 0505 06/29/13 0445  NA 137 132*  K 3.9 4.0  CL 104 97  CO2 26 28  GLUCOSE 115* 128*  BUN 16 7  CREATININE 0.35* 0.34*  CALCIUM 8.8 9.3   PT/INR No results found for this basename: LABPROT, INR,  in the last 72 hours   Recent Labs Lab 06/22/13 1320 06/23/13 1125  AST 13 17  ALT 6 8  ALKPHOS 67 64  BILITOT 0.30 0.36  PROT 7.6 7.7  ALBUMIN 3.6 3.7     Lipase  No results found for this basename: lipase     Studies/Results: No results found.  Medications: . nicotine  14 mg Transdermal Daily    Assessment/Plan POD#1 Dysphagia; esophageal cancer; protein calorie malnutrition  OPEN JEJUNOSTOMY TUBE PLACEMENT, 06/28/2013,  Henry Ina, MD Dehydration  Protein-calorie malnutrition, severe  Ongoing tobacco use  H/o alcohol use Leukocytosis Anemia (H/H 8.1/24.8)   PLan :  Start TF at 10 ml per hour for next 24 then you can slowly increase to goal rate.  He can have PO's as tolerated, and mobilize also as tolerated.  Dr. Andrey Campanile would  like to hold off on chemo till next week.       LOS: 2 days    Henry Leonard 06/29/2013

## 2013-06-30 ENCOUNTER — Ambulatory Visit: Payer: Medicaid Other

## 2013-06-30 ENCOUNTER — Telehealth: Payer: Self-pay | Admitting: *Deleted

## 2013-06-30 LAB — GLUCOSE, CAPILLARY
Glucose-Capillary: 121 mg/dL — ABNORMAL HIGH (ref 70–99)
Glucose-Capillary: 121 mg/dL — ABNORMAL HIGH (ref 70–99)
Glucose-Capillary: 121 mg/dL — ABNORMAL HIGH (ref 70–99)
Glucose-Capillary: 119 mg/dL — ABNORMAL HIGH (ref 70–99)

## 2013-06-30 LAB — BASIC METABOLIC PANEL
Calcium: 8.8 mg/dL (ref 8.4–10.5)
Chloride: 93 mEq/L — ABNORMAL LOW (ref 96–112)
GFR calc Af Amer: >90 mL/min (ref >90)
GFR calc non Af Amer: >90 mL/min (ref >90)
Potassium: 3.4 mEq/L — ABNORMAL LOW (ref 3.5–5.1)
Sodium: 126 mEq/L — ABNORMAL LOW (ref 135–145)

## 2013-06-30 LAB — CBC
HCT: 23.9 % — ABNORMAL LOW (ref 39.0–52.0)
Hemoglobin: 7.8 g/dL — ABNORMAL LOW (ref 13.0–17.0)
MCHC: 32.6 g/dL (ref 30.0–36.0)
Platelets: 433 10*3/uL — ABNORMAL HIGH (ref 150–400)
RDW: 12.6 % (ref 11.5–15.5)
WBC: 12 10*3/uL — ABNORMAL HIGH (ref 4.0–10.5)

## 2013-06-30 MED ORDER — DIPHENHYDRAMINE HCL 50 MG/ML IJ SOLN
12.5000 mg | Freq: Once | INTRAMUSCULAR | Status: AC
  Administered 2013-06-30: 12.5 mg via INTRAVENOUS
  Filled 2013-06-30: qty 1

## 2013-06-30 MED ORDER — OSMOLITE 1.5 CAL PO LIQD
1000.0000 mL | ORAL | Status: DC
  Administered 2013-06-30 – 2013-07-02 (×3): 1000 mL
  Filled 2013-06-30 (×6): qty 1000

## 2013-06-30 NOTE — Progress Notes (Addendum)
2 Days Post-Op  Subjective: Feels good, but sore.  Tolerating TF at 62mL/hour.  No N/V.  Ambulating OOB.  Hasnt tried any PO intake due to discomfort.    Objective: Vital signs in last 24 hours: Temp:  [97.6 F (36.4 C)-98.9 F (37.2 C)] 97.6 F (36.4 C) (11/13 0421) Pulse Rate:  [114-118] 114 (11/13 0421) Resp:  [16-18] 16 (11/13 0421) BP: (119-126)/(83-88) 124/83 mmHg (11/13 0421) SpO2:  [99 %-100 %] 100 % (11/13 0421) Weight:  [121 lb 7.6 oz (55.1 kg)] 121 lb 7.6 oz (55.1 kg) (11/13 0617) Last BM Date: 06/27/13  Intake/Output from previous day: 11/12 0701 - 11/13 0700 In: 2475 [I.V.:2322; NG/GT:153] Out: 1700 [Urine:1700] Intake/Output this shift:    PE: Gen:  Alert, NAD, pleasant Abd: Soft, mild tenderness at J tube site, ND, +BS, no HSM, incisions C/D/I, Jtube with feeds going  Lab Results:   Recent Labs  06/27/13 1745 06/29/13 0445  WBC 12.4* 14.1*  HGB 7.8* 8.1*  HCT 24.1* 24.8*  PLT 365 439*   BMET  Recent Labs  06/28/13 0505 06/29/13 0445  NA 137 132*  K 3.9 4.0  CL 104 97  CO2 26 28  GLUCOSE 115* 128*  BUN 16 7  CREATININE 0.35* 0.34*  CALCIUM 8.8 9.3   PT/INR No results found for this basename: LABPROT, INR,  in the last 72 hours CMP     Component Value Date/Time   NA 132* 06/29/2013 0445   NA 140 06/23/2013 1125   K 4.0 06/29/2013 0445   K 4.1 06/23/2013 1125   CL 97 06/29/2013 0445   CO2 28 06/29/2013 0445   CO2 20* 06/23/2013 1125   GLUCOSE 128* 06/29/2013 0445   GLUCOSE 104 06/23/2013 1125   BUN 7 06/29/2013 0445   BUN 26.4* 06/23/2013 1125   CREATININE 0.34* 06/29/2013 0445   CREATININE 0.7 06/23/2013 1125   CALCIUM 9.3 06/29/2013 0445   CALCIUM 10.6* 06/23/2013 1125   PROT 7.7 06/23/2013 1125   PROT 8.2 12/04/2011 1505   ALBUMIN 3.7 06/23/2013 1125   ALBUMIN 3.6 12/04/2011 1505   AST 17 06/23/2013 1125   AST 14 12/04/2011 1505   ALT 8 06/23/2013 1125   ALT 8 12/04/2011 1505   ALKPHOS 64 06/23/2013 1125   ALKPHOS 58 12/04/2011 1505   BILITOT 0.36 06/23/2013 1125   BILITOT 0.2* 12/04/2011 1505   GFRNONAA >90 06/29/2013 0445   GFRAA >90 06/29/2013 0445   Lipase  No results found for this basename: lipase       Studies/Results: No results found.  Anti-infectives: Anti-infectives   Start     Dose/Rate Route Frequency Ordered Stop   06/28/13 1000  [MAR Hold]  ciprofloxacin (CIPRO) IVPB 400 mg     (On MAR Hold since 06/28/13 1103)  Comments:  On call or   400 mg 200 mL/hr over 60 Minutes Intravenous On call to O.R. 06/28/13 0931 06/28/13 1136   06/28/13 1000  metroNIDAZOLE (FLAGYL) IVPB 500 mg  Status:  Discontinued    Comments:  On call OR   500 mg 100 mL/hr over 60 Minutes Intravenous On call to O.R. 06/28/13 0931 06/28/13 0935       Assessment/Plan Dysphagia; esophageal cancer; protein calorie malnutrition POD #1 s/p OPEN JEJUNOSTOMY TUBE PLACEMENT, 06/28/2013, Atilano Ina, MD  Dehydration  Protein-calorie malnutrition, severe  Ongoing tobacco use  H/o alcohol use  Leukocytosis up to 14.1  Acute on chronic - improving  PLan :  1.  Started TF  at 10 ml per hour which he's tolerating so can slowly increase to goal rate 2.  He can have PO's as tolerated, and mobilize also as tolerated.  3.  Dr. Andrey Campanile would like to hold off on chemo until next week. 4.  SCD's and lovenox 5.  Dietitian following 6.  Will need HH nursing for tube feeding teaching/supplies etc.    LOS: 3 days    DORT, MEGAN 06/30/2013, 11:02 AM Pager: [phone]  Pt seen & examined. Sore. Denies distension. No n/v. No bm  Alert, nad Soft, expected mild ttp. Incision c/d/i. j tube intact. Feeds at 10cc/hr  S/p open J tube Can advance J tube feeds to goal - will adv feeds per nutrition rec Decrease IVF ONLY LIQUIDS TO GO THROUGH TUBE - NO CRUSHED MEDS j tube teaching  Once pt reaches goal, he may benefit/be easier to manage his j tube feeds by being switched to cyclic feeds at night. Will need to continue to flush tube during  day  Mary Sella. Andrey Campanile, MD, FACS General, Bariatric, & Minimally Invasive Surgery [facility], [place]

## 2013-06-30 NOTE — Progress Notes (Signed)
[facility] PROGRESS NOTE  Henry GLANDER NFA:[id number] DOB: [birth year] DOA: 06/27/2013 PCP: Buckner Malta, MD  Brief narrative: 19 year oldmale with past medical history of locally advanced tongue cancer diagnosed in 2013, status post induction Erlotinib followed by surgery and adjuvant chemotherapy with cisplatin and concurrent radiation, T3 esophageal mass with biopsy results confirming well-differentiated squamous cell carcinoma undergoing radiation therapy with plans to followup with Dr. Lenis Noon at [facility] for surgical resection after he completes his chemotherapy (had 1st chemo 06/22/13) Henry Leonard (has had 3 treatments so far), who was referred as a direct admission on 06/27/2013, by Dr. Dayton Scrape after he declined having his radiation treatment, secondary to concerns for dehydration. He has been seen and evaluated by surgery and has had J tube placed with no subsequent complications.   Assessment/Plan:   Principal Problem:  Dehydration secondary to inability to tolerate PO intake  - J tube placed and TF started; po intake as pt tolerates  - will follow up on recommendations from nutrition in regards to tube feeds, rate of increase and goal rate  Active Problems:  Carcinoma of distal third of esophagus / dysphasia  - had J tube placed  - Continue radiation and weekly Taxol/carboplatin as pre oncology management  Leucocytosis  - WBC count trending up from 12.4 to 14.1  - no fevers  - will continue to monitor fever curve; no fever in past 24 hours Anemia of chronic disease  - related to history of malignancy and sequela of chemotherapy  - transfuse if Hgb less than 7; last Hgb 8.1  Protein-calorie malnutrition, severe  - Dietitian consultation requested. Now has J tube  - PO intake as tolerated  Tobacco abuse  - Counseled. Ordered nicotine patch   Code Status: Full.  Family Communication: No family at the bedside.  Disposition Plan: Home when stable. Needs PT  evaluation   IV access:  Port-A-Cath right chest wall. Medical Consultants:  Dr. Mancel Bale, Oncology  Dr. Gaynelle Adu, Surgery Other Consultants:  Dietitian Anti-infectives:  None.  Manson Passey, MD  [facility] Pager [phone]  If 7PM-7AM, please contact night-coverage [email or web address] Password [email or web address] 06/30/2013, 7:06 AM   LOS: 3 days    HPI/Subjective: No acute overnight events.  Objective: Filed Vitals:   06/29/13 1338 06/29/13 2024 06/30/13 0421 06/30/13 0617  BP: 126/88 119/86 124/83   Pulse: 116 118 114   Temp: 97.9 F (36.6 C) 98.9 F (37.2 C) 97.6 F (36.4 C)   TempSrc: Axillary Axillary Axillary   Resp: 16 18 16    Height:      Weight:    55.1 kg (121 lb 7.6 oz)  SpO2: 99% 99% 100%     Intake/Output Summary (Last 24 hours) at 06/30/13 0706 Last data filed at 06/30/13 0600  Gross per 24 hour  Intake   2475 ml  Output   1500 ml  Net    975 ml    Exam:   General:  Pt is alert, follows commands appropriately, not in acute distress  Cardiovascular: Regular rate and rhythm, S1/S2 appreciated  Respiratory: Clear to auscultation bilaterally, no wheezing, no crackles, no rhonchi  Abdomen: Soft, non tender, non distended, bowel sounds present, J tube in place   Extremities: No edema, pulses DP and PT palpable bilaterally  Neuro: Grossly nonfocal  Data Reviewed: Basic Metabolic Panel:  Recent Labs Lab 06/23/13 1125 06/27/13 1745 06/28/13 0505 06/29/13 0445  NA 140 134* 137 132*  K 4.1 3.8 3.9 4.0  CL  --  100 104 97  CO2 20* 25 26 28   GLUCOSE 104 116* 115* 128*  BUN 26.4* 20 16 7   CREATININE 0.7 0.34* 0.35* 0.34*  CALCIUM 10.6* 9.0 8.8 9.3  MG  --  1.8  --   --   PHOS  --  2.9  --   --    Liver Function Tests:  Recent Labs Lab 06/23/13 1125  AST 17  ALT 8  ALKPHOS 64  BILITOT 0.36  PROT 7.7  ALBUMIN 3.7   No results found for this basename: LIPASE, AMYLASE,  in the last 168 hours No results found for this basename:  AMMONIA,  in the last 168 hours CBC:  Recent Labs Lab 06/23/13 1125 06/27/13 1745 06/29/13 0445  WBC 21.2* 12.4* 14.1*  NEUTROABS 19.5* 10.9*  --   HGB 10.8* 7.8* 8.1*  HCT 33.8* 24.1* 24.8*  MCV 86.4 85.8 84.9  PLT 520* 365 439*   Cardiac Enzymes: No results found for this basename: CKTOTAL, CKMB, CKMBINDEX, TROPONINI,  in the last 168 hours BNP: No components found with this basename: POCBNP,  CBG:  Recent Labs Lab 06/29/13 1829 06/29/13 2022 06/30/13 0015 06/30/13 0419  GLUCAP 115* 111* 136* 121*    SURGICAL PCR SCREEN     Status: None   Collection Time    06/28/13 10:46 AM      Result Value Range Status   MRSA, PCR NEGATIVE  NEGATIVE Final   Staphylococcus aureus NEGATIVE  NEGATIVE Final     Studies: No results found.  Scheduled Meds: . enoxaparin (LOVENOX) injection  40 mg Subcutaneous Q24H  . feeding supplement (OSMOLITE 1.5 CAL)  1,000 mL Per Tube Q24H  . nicotine  14 mg Transdermal Daily   Continuous Infusions: . dextrose 5 % and 0.9% NaCl 100 mL/hr at 06/30/13 0204

## 2013-06-30 NOTE — Evaluation (Addendum)
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 S/P open jejunostomy placed on 06/28/13.  Clinical Impression  Pt cooperative with mobility, reports pain in abdomen, hopes pain will lessen so he can walk more. Pt will benefit from PT while in acute care to return to modified independent level. Recommend OT consult. Screen for CIR has been placed.. Pt's HR in 120's throughout session. Sats .96% RA, replaced 2 l as was on when PT entered room.    PT Assessment  Patient needs continued PT services    Follow Up Recommendations  CIR    Does the patient have the potential to tolerate intense rehabilitation      Barriers to Discharge Decreased caregiver support      Equipment Recommendations  Rolling walker with 5" wheels    Recommendations for Other Services OT consult;Rehab consult   Frequency Min 3X/week    Precautions / Restrictions Precautions Precaution Comments: dysphagia, R port, abd tube   Pertinent Vitals/Pain Abdomen at tube=7.       Mobility  Bed Mobility Bed Mobility: Supine to Sit;Sit to Supine Supine to Sit: 6: Modified independent (Device/Increase time) Sit to Supine: 6: Modified independent (Device/Increase time) Transfers Transfers: Sit to Stand;Stand to Sit;Stand Pivot Transfers Sit to Stand: 4: Min assist;From bed;From chair/3-in-1 Stand to Sit: 4: Min assist;To chair/3-in-1;To bed Stand Pivot Transfers: 4: Min assist;3: Mod assist Details for Transfer Assistance: handhold assistance  for steadying when standing. and takeing steps to recliner , then back to bed. Ambulation/Gait Ambulation/Gait Assistance: Not tested (comment)    Exercises     PT Diagnosis: Generalized  weakness;Difficulty walking  PT Problem List: Decreased strength;Decreased activity tolerance;Decreased mobility;Decreased balance;Decreased knowledge of precautions;Cardiopulmonary status limiting activity PT Treatment Interventions: DME instruction;Gait training;Functional mobility training;Therapeutic activities;Therapeutic exercise;Patient/family education     PT Goals(Current goals can be found in the care plan section) Acute Rehab PT Goals Patient Stated Goal: I want to get better and stronger. 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 S/P open jejunostomy placed on 06/28/13.       Prior Functioning  Home Living Family/patient expects to be discharged to:: Private residence Living Arrangements: Non-relatives/Friends Available Help at Discharge: Friend(s);Available PRN/intermittently Type of Home: House Home Access: Stairs to enter [employer] of Steps: 1/2 Home Layout: One level Home Equipment: None Additional Comments: has a roommate who is gone for a few weeks Prior Function Level of Independence: Independent Communication Communication:  (some slurring due to dx.)    Cognition  Cognition Arousal/Alertness: Awake/alert Behavior During Therapy: WFL for tasks assessed/performed Overall Cognitive Status: Within Functional Limits for tasks assessed    Extremity/Trunk Assessment Upper Extremity Assessment Upper Extremity Assessment: Generalized weakness Lower Extremity Assessment Lower Extremity Assessment: Generalized weakness Cervical / Trunk Assessment Cervical / Trunk Assessment: Kyphotic   Balance Balance Balance Assessed: Yes Dynamic Sitting Balance Dynamic Sitting - Balance Support: No upper extremity  supported Dynamic Sitting - Level of Assistance: 7: Independent Static Standing Balance Static Standing - Balance Support: No upper extremity supported Static Standing - Level of Assistance: 4:  Min assist  End of Session PT - End of Session Activity Tolerance: Patient tolerated treatment well;Patient limited by fatigue Patient left: in bed;with call bell/phone within reach;with bed alarm set Nurse Communication: Mobility status  GP     Rada Hay 06/30/2013, 2:22 PM Blanchard Kelch PT [phone]

## 2013-06-30 NOTE — Progress Notes (Signed)
NUTRITION FOLLOW UP  Pt meets criteria for severe MALNUTRITION in the context of chronic illness as evidenced by <75% estimated energy intake for over 1 month with severe depletion of fat and muscle stores.  Intervention:   - Continue to advance Osmolite 1.5 TF formula via J-tube by 10 ml/hr every four hours until goal rate of 50 ml/hr is reached. Goal rate will provide 1800 kcal, 75 g protein, 914 ml free water and will meet 100% of estimated calorie and protein needs.  - Continue 150 ml water flushes 6 times/day. - RD will continue to monitor.  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 Lenis Noon at [facility] for surgical resection after he completes his chemotherapy (had 1st chemo 06/22/13) Henry Leonard (has had 3 treatments so far), who was referred as a direct admission by Dr. Dayton Scrape after he refused his radiation treatment today, secondary to concerns for dehydration. Mr. Clemon has not been able to take in much PO, he is on a liquid diet secondary to dysphagia. He has been unable to consistently take in POs, no complaints of frank vomiting but has refluxing of liquids up after attempts to drink. He has only been able to take in 2 cans of Osmolite 1.5 a day with minimal fluid intake otherwise. Dr. Truett Perna suggests a surgical referral for placement of a surgical J tube since, if he tolerates current treatment, the plan is to pursue  esophagectomy. Hx of PEG from April 2013-March 2014 r/t treatment for tongue CA. Has been followed by outpatient [facility] RD. S/p open J tube placement yesterday.   Pt is currently tolerating TF at a rate of 20 ml/hr with 150 ml flushes every four hours. Per RN, pt will be advanced by 10 ml every 4 hours to goal of 50 ml/hr. Per RN, pt is having very little to no residuals. Pt is still not tolerating any po intake because of pain.   Height: Ht Readings from Last 1 Encounters:  06/27/13 5\' 6"  (1.676 m)    Weight Status:   Wt Readings from Last 1 Encounters:  06/30/13 121 lb 7.6 oz (55.1 kg)    Re-estimated needs:  Kcal: 1650-1900 Protein: 75-85 g Fluid: 1.7-1.9 L  Skin: incision on abdomen  Diet Order: Clear Liquid   Intake/Output Summary (Last 24 hours) at 06/30/13 1603 Last data filed at 06/30/13 1223  Gross per 24 hour  Intake   1607 ml  Output   1775 ml  Net   -168 ml    Last BM: none recorded   Labs:   Recent Labs Lab 06/27/13 1745 06/28/13 0505 06/29/13 0445 06/30/13 1150  NA 134* 137 132* 126*  K 3.8 3.9 4.0 3.4*  CL 100 104 97 93*  CO2 25 26 28 25   BUN 20 16 7  5*  CREATININE 0.34* 0.35* 0.34* 0.31*  CALCIUM 9.0 8.8 9.3 8.8  MG 1.8  --   --   --   PHOS 2.9  --   --   --  GLUCOSE 116* 115* 128* 134*    CBG (last 3)   Recent Labs  06/30/13 0419 06/30/13 0759 06/30/13 1226  GLUCAP 121* 121* 121*    Scheduled Meds: . enoxaparin (LOVENOX) injection  40 mg Subcutaneous Q24H  . nicotine  14 mg Transdermal Daily    Continuous Infusions: . dextrose 5 % and 0.9% NaCl 50 mL/hr at 06/30/13 1448  . feeding supplement (OSMOLITE 1.5 CAL) 1,000 mL (06/30/13 1457)    Ebbie Latus RD, LDN

## 2013-06-30 NOTE — Progress Notes (Signed)
Delayed note entry for yesterday  Pt sore. Doesn't feel bloated. No change since J tube feeds started  Soft, expected mild ttp. Nd. J tube secure  Cont J tube at 10cc/hr for today. If no issues overnight can advance starting Thursday  Cleveland Yarbro M. Andrey Campanile, MD, FACS General, Bariatric, & Minimally Invasive Surgery [facility], [place]

## 2013-06-30 NOTE — Telephone Encounter (Signed)
Called the floor for patient room 1306, spoke with his nurse Rene Kocher, she stated patient stated he didn't want to start radiation treatment until Monday, thanked her will let MD Dr.Moody and therapist know 10:19 AM

## 2013-06-30 NOTE — Progress Notes (Signed)
Rehab Admissions Coordinator Note:  Patient was screened by Trish Mage for appropriateness for an Inpatient Acute Rehab Consult.  At this time, an inpatient rehab consult has already been ordered and is pending completion.  Trish Mage 06/30/2013, 4:48 PM  I can be reached at [phone].

## 2013-07-01 ENCOUNTER — Other Ambulatory Visit: Payer: Self-pay | Admitting: *Deleted

## 2013-07-01 ENCOUNTER — Ambulatory Visit: Payer: Medicaid Other | Admitting: Radiation Oncology

## 2013-07-01 ENCOUNTER — Telehealth: Payer: Self-pay | Admitting: Oncology

## 2013-07-01 ENCOUNTER — Ambulatory Visit: Payer: Medicaid Other

## 2013-07-01 DIAGNOSIS — C029 Malignant neoplasm of tongue, unspecified: Secondary | ICD-10-CM

## 2013-07-01 LAB — CBC
HCT: 23.9 % — ABNORMAL LOW (ref 39.0–52.0)
MCH: 27.6 pg (ref 26.0–34.0)
MCHC: 32.6 g/dL (ref 30.0–36.0)
Platelets: 459 10*3/uL — ABNORMAL HIGH (ref 150–400)
RDW: 12.7 % (ref 11.5–15.5)
WBC: 10.2 10*3/uL (ref 4.0–10.5)

## 2013-07-01 LAB — GLUCOSE, CAPILLARY
Glucose-Capillary: 107 mg/dL — ABNORMAL HIGH (ref 70–99)
Glucose-Capillary: 111 mg/dL — ABNORMAL HIGH (ref 70–99)
Glucose-Capillary: 114 mg/dL — ABNORMAL HIGH (ref 70–99)
Glucose-Capillary: 125 mg/dL — ABNORMAL HIGH (ref 70–99)
Glucose-Capillary: 127 mg/dL — ABNORMAL HIGH (ref 70–99)
Glucose-Capillary: 141 mg/dL — ABNORMAL HIGH (ref 70–99)

## 2013-07-01 LAB — BASIC METABOLIC PANEL
BUN: 6 mg/dL (ref 6–23)
Calcium: 8.6 mg/dL (ref 8.4–10.5)
Chloride: 95 mEq/L — ABNORMAL LOW (ref 96–112)
GFR calc Af Amer: >90 mL/min (ref >90)
GFR calc non Af Amer: >90 mL/min (ref >90)
Potassium: 3.6 mEq/L (ref 3.5–5.1)

## 2013-07-01 MED ORDER — MAGNESIUM HYDROXIDE 400 MG/5ML PO SUSP: 30.0000 mL | Freq: Every day | ORAL | Status: DC | PRN

## 2013-07-01 MED ORDER — FREE WATER
150.0000 mL | Status: DC
  Administered 2013-07-01 – 2013-07-03 (×13): 150 mL

## 2013-07-01 NOTE — Progress Notes (Signed)
Some incisional pain. No n/v. Denies bloating. +flatus. TF at goal. No bm  Alert, nad Soft, nd, incision c/d/i. No cellulitis. J tube ok  S/p open J tube  Instruct pt on J tube care Recommend transitioning pt to cyclic evening TF (to make it easier to manage) if plan is to send home and not rehab Pt will need to flush J tube several times during day with some tap water (30cc) No CRUSHED meds through feeding tube - only liquids Will give Milk of Magnesia for BM Will need staples out in a week or so  Mary Sella. Andrey Campanile, MD, FACS General, Bariatric, & Minimally Invasive Surgery [facility], [place]

## 2013-07-01 NOTE — Consult Note (Signed)
Physical Medicine and Rehabilitation Consult Reason for Consult: Deconditioning/esophageal cancer Referring Physician: [facility]   HPI: Henry Leonard is a 57 y.o. right-handed male with history of advanced  esophageal cancer diagnosed 2013 with chemotherapy radiation therapy. Patient with findings of T3 esophageal mass with biopsy confirmed well-differentiated squamous cell carcinoma and currently undergoing radiation therapy as well as chemotherapy with first treatment 06/22/2013 as well as 3 radiation treatments completed. Admitted 06/27/2013 with decreased PO intake as well as dysphasia. Underwent placement of open jejunostomy tube 06/28/2013 per Dr. Gaynelle Adu for nutritional support. Await Followup oncology services on plan resuming chemoradiation therapy. Hospital course chronic anemia hemoglobin 7.8 and monitored question plan transfusion. Subcutaneous Lovenox added for DVT prophylaxis. Physical therapy evaluation completed 06/30/2013 for deconditioning with recommendations of physical medicine rehabilitation consult to consider inpatient rehabilitation services.  Review of Systems  Gastrointestinal: Positive for constipation.  Neurological: Positive for weakness.       Dysphasia  All other systems reviewed and are negative.   Past Medical History  Diagnosis Date  . Tobacco abuse   . Leucocytosis 11/01/11  . Dysphagia   . Cancer of tongue 3/16/ 13    Associated Neck Nodes; T2N2  . Seasonal allergies   . S/P radiation therapy 02/24/12 - 04/06/12    Surgical bed and bilateral neck / 60 Gy / 30 Fractions VMAT Plan  . Status post chemotherapy 02/24/12 -03/2012     Cisplatin Concurrently with Radiation Therapy  . Esophageal cancer 05/11/13    T3 Squamous Cell Carcinoma,  Invasive Well differentiated  . Edentulous    Past Surgical History  Procedure Laterality Date  . Biopsy tongue  11/12/11    Tongue - Squamous Cell Cancer  . Glossectomy  01/19/12    with B/l neck dissection; radial forearm  free flap  . Multiple tooth extractions  11/2011  . Feeding tube insertion and removal  11/2011 inserted removed 10/2012  . Tracheostomy  11/2011  . Tracheal closed stent removal w/ mlb  11/20/2102  . Gastrojejunostomy N/A 06/28/2013    Procedure: OPEN JEJUNOSTOMY TUBE PLACEMENT;  Surgeon: Atilano Ina, MD;  Location: [facility] ORS;  Service: General;  Laterality: N/A;   Family History  Problem Relation Age of Onset  . Esophageal cancer Father   . Seizures Mother    Social History:  reports that he has been smoking Cigarettes.  He has a 52.5 pack-year smoking history. He has never used smokeless tobacco. He reports that he drinks alcohol. He reports that he does not use illicit drugs. Allergies:  Allergies  Allergen Reactions  . Penicillins Rash   Medications Prior to Admission  Medication Sig Dispense Refill  . HYDROcodone-acetaminophen (HYCET) 7.5-325 mg/15 ml solution Take 10-15 mLs by mouth every 4 (four) hours as needed (pain).      . Naphazoline HCl (CLEAR EYES OP) Place 2 drops into both eyes daily as needed (For dry or irritated eyes.).      Marland Kitchen Nutritional Supplements (OSMOLITE) LIQD Take 237 mLs by mouth See admin instructions. He drinks 3-4 cans per day.      . promethazine (PHENERGAN) 6.25 MG/5ML syrup Take 12.5 mg by mouth every 4 (four) hours as needed for nausea (Call if nausea not controlled.).      . [DISCONTINUED] HYDROcodone-acetaminophen (HYCET) 7.5-325 mg/15 ml solution Take 10-15 mLs by mouth every 4 (four) hours as needed.  473 mL  0  . lidocaine-prilocaine (EMLA) cream Apply to port a cath site one hour prior to use. Do  not rub in. Cover with plastic.  30 g  2    Home: Home Living Family/patient expects to be discharged to:: Private residence Living Arrangements: Non-relatives/Friends Available Help at Discharge: Friend(s);Available PRN/intermittently Type of Home: House Home Access: Stairs to enter [employer] of Steps: 1/2 Home Layout: One level Home  Equipment: None Additional Comments: has a roommate who is gone for a few weeks  Functional History:   Functional Status:  Mobility: Bed Mobility Bed Mobility: Supine to Sit;Sit to Supine Supine to Sit: 6: Modified independent (Device/Increase time) Sit to Supine: 6: Modified independent (Device/Increase time) Transfers Transfers: Sit to Stand;Stand to Sit;Stand Pivot Transfers Sit to Stand: 4: Min assist;From bed;From chair/3-in-1 Stand to Sit: 4: Min assist;To chair/3-in-1;To bed Stand Pivot Transfers: 4: Min assist;3: Mod assist Ambulation/Gait Ambulation/Gait Assistance: Not tested (comment)    ADL:    Cognition: Cognition Overall Cognitive Status: Within Functional Limits for tasks assessed Orientation Level: Oriented X4 Cognition Arousal/Alertness: Awake/alert Behavior During Therapy: WFL for tasks assessed/performed Overall Cognitive Status: Within Functional Limits for tasks assessed  Blood pressure 109/78, pulse 109, temperature 97.9 F (36.6 C), temperature source Axillary, resp. rate 16, height 5\' 6"  (1.676 m), weight 49.4 kg (108 lb 14.5 oz), SpO2 98.00%. Physical Exam  Vitals reviewed. Constitutional: He is oriented to person, place, and time.  57 year old male appearing older then stated age, emaciated   HENT:  Head: Normocephalic.  Speech very coarse, almost unintelligible   Eyes: EOM are normal.  Neck: Normal range of motion. Neck supple. No thyromegaly present.  Cardiovascular: Normal rate and regular rhythm.   Respiratory:  Decreased breath sounds at the bases  GI: Bowel sounds are normal. He exhibits no distension.  J-tube in place  Musculoskeletal: He exhibits no edema.  Substantial muscle wasting  Neurological: He is alert and oriented to person, place, and time. No cranial nerve deficit.   Speech is difficult to decipher. He makes good eye contact with examiner and follows full commands. Strength 3+ prox ue to 4/5 distally. HF and KE 3+ and  ankle 4/5. Sensory exam grossly intact.   Psychiatric: He has a normal mood and affect. His behavior is normal.    Results for orders placed during the hospital encounter of 06/27/13 (from the past 24 hour(s))  GLUCOSE, CAPILLARY     Status: Abnormal   Collection Time    06/30/13  7:59 AM      Result Value Range   Glucose-Capillary 121 (*) 70 - 99 mg/dL   Comment 1 Documented in Chart     Comment 2 Notify RN    CBC     Status: Abnormal   Collection Time    06/30/13 11:50 AM      Result Value Range   WBC 12.0 (*) 4.0 - 10.5 K/uL   RBC 2.82 (*) 4.22 - 5.81 MIL/uL   Hemoglobin 7.8 (*) 13.0 - 17.0 g/dL   HCT 16.1 (*) 09.6 - 04.5 %   MCV 84.8  78.0 - 100.0 fL   MCH 27.7  26.0 - 34.0 pg   MCHC 32.6  30.0 - 36.0 g/dL   RDW 40.9  81.1 - 91.4 %   Platelets 433 (*) 150 - 400 K/uL  BASIC METABOLIC PANEL     Status: Abnormal   Collection Time    06/30/13 11:50 AM      Result Value Range   Sodium 126 (*) 135 - 145 mEq/L   Potassium 3.4 (*) 3.5 - 5.1 mEq/L  Chloride 93 (*) 96 - 112 mEq/L   CO2 25  19 - 32 mEq/L   Glucose, Bld 134 (*) 70 - 99 mg/dL   BUN 5 (*) 6 - 23 mg/dL   Creatinine, Ser 1.47 (*) 0.50 - 1.35 mg/dL   Calcium 8.8  8.4 - 82.9 mg/dL   GFR calc non Af Amer >90  >90 mL/min   GFR calc Af Amer >90  >90 mL/min  GLUCOSE, CAPILLARY     Status: Abnormal   Collection Time    06/30/13 12:26 PM      Result Value Range   Glucose-Capillary 121 (*) 70 - 99 mg/dL   Comment 1 Documented in Chart     Comment 2 Notify RN    GLUCOSE, CAPILLARY     Status: Abnormal   Collection Time    06/30/13  7:20 PM      Result Value Range   Glucose-Capillary 119 (*) 70 - 99 mg/dL   Comment 1 Documented in Chart     Comment 2 Notify RN    GLUCOSE, CAPILLARY     Status: Abnormal   Collection Time    06/30/13 10:09 PM      Result Value Range   Glucose-Capillary 104 (*) 70 - 99 mg/dL   Comment 1 Notify RN    GLUCOSE, CAPILLARY     Status: Abnormal   Collection Time    07/01/13 12:18 AM       Result Value Range   Glucose-Capillary 141 (*) 70 - 99 mg/dL  GLUCOSE, CAPILLARY     Status: Abnormal   Collection Time    07/01/13  5:27 AM      Result Value Range   Glucose-Capillary 127 (*) 70 - 99 mg/dL   No results found.  Assessment/Plan: Diagnosis: esophageal cancer, failure to thrive with substantial deconditioning 1. Does the need for close, 24 hr/day medical supervision in concert with the patient's rehab needs make it unreasonable for this patient to be served in a less intensive setting? Potentially 2. Co-Morbidities requiring supervision/potential complications: ACD, J tube nutrition 3. Due to bladder management, bowel management, safety, skin/wound care, disease management, medication administration, pain management and patient education, does the patient require 24 hr/day rehab nursing? Yes 4. Does the patient require coordinated care of a physician, rehab nurse, PT (1-2 hrs/day, 5 days/week), OT (1-2 hrs/day, 5 days/week) and ?SLP to address physical and functional deficits in the context of the above medical diagnosis(es)? Yes Addressing deficits in the following areas: balance, endurance, locomotion, strength, transferring, bowel/bladder control, bathing, dressing, feeding, grooming, toileting, cognition, speech, swallowing and psychosocial support 5. Can the patient actively participate in an intensive therapy program of at least 3 hrs of therapy per day at least 5 days per week? Potentially 6. The potential for patient to make measurable gains while on inpatient rehab is good and fair 7. Anticipated functional outcomes upon discharge from inpatient rehab are supervision  with PT, supervision to min assist with OT, mod I to supervision with SLP. 8. Estimated rehab length of stay to reach the above functional goals is: 8-12 days 9. Does the patient have adequate social supports to accommodate these discharge functional goals? Potentially 10. Anticipated D/C setting:  Home 11. Anticipated post D/C treatments: HH therapy 12. Overall Rehab/Functional Prognosis: good and fair  RECOMMENDATIONS: This patient's condition is appropriate for continued rehabilitative care in the following setting: CIR if social supports are available. Patient has agreed to participate in recommended program. Yes Note that  insurance prior authorization may be required for reimbursement for recommended care.  Comment: I would be willing to bring him to rehab if adequate social support is available (either brother or sister) at discharge.  Rehab RN to follow up.   Ranelle Oyster, MD, [place] [facility]     07/01/2013

## 2013-07-01 NOTE — Progress Notes (Signed)
He is now on jejunostomy tube feedings.  The plan is to resume radiation 07/04/2013. [facility].  Please call oncology as needed.

## 2013-07-01 NOTE — Telephone Encounter (Signed)
Talked to pt and advised him to get appt calendar for November on Monday 11/17th, appt has been changed and emailed [place] regarding chemo

## 2013-07-01 NOTE — Care Management Note (Signed)
   CARE MANAGEMENT NOTE 07/01/2013  Patient:  Henry Leonard, Henry Leonard   Account Number:  [id number]  Date Initiated:  06/28/2013  Documentation initiated by:  Iolanda Folson  Subjective/Objective Assessment:   57 yo male admitted with dehydration. J-tube placment 06/28/13     Action/Plan:   Home when stable   Anticipated DC Date:     Anticipated DC Plan:  HOME/SELF CARE      DC Planning Services  CM consult      Choice offered to / List presented to:  NA   DME arranged  NA      DME agency  NA     HH arranged  NA      HH agency  NA   Status of service:  In process, will continue to follow Medicare Important Message given?   (If response is "NO", the following Medicare IM given date fields will be blank) Date Medicare IM given:   Date Additional Medicare IM given:    Discharge Disposition:    Per UR Regulation:  Reviewed for med. necessity/level of care/duration of stay  If discussed at Long Length of Stay Meetings, dates discussed:    Comments:  07/01/13 1144 Yalena Colon,RN,MSN [phone] Pt recommendation for CIR. Md order for IP rehab consult. Awating decision of appropriatness by CIR MD.  06/28/13 1143 Kamla Skilton,RN,MSN [phone] Chart reviewed for utilization of services. No needs identified at this time. Will continue to follow post surgical intervention.

## 2013-07-01 NOTE — Progress Notes (Addendum)
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 He has been seen and evaluated by surgery and has had J tube placed with no subsequent complications.   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 Now has J tube  - PO intake as tolerated  Tobacco abuse  - Counseled. Ordered nicotine patch   Code Status: Full.  Family Communication: No family at the bedside.  Disposition Plan: inpatient rehab likely in am  IV access:  Port-A-Cath right chest wall. Medical Consultants:  Henry Leonard, Oncology  Henry Leonard, Surgery Other  Consultants:  Dietitian Anti-infectives:  None.  Henry Passey, MD  [facility] Pager [phone]  If 7PM-7AM, please contact night-coverage [email or web address] Password [facility] 07/01/2013, 6:31 PM   LOS: 4 days    HPI/Subjective: No acute overnight events.  Objective: Filed Vitals:   06/30/13 2207 07/01/13 0340 07/01/13 0521 07/01/13 1400  BP: 136/93  109/78 113/79  Pulse: 102  109 104  Temp: 98.3 F (36.8 C)  97.9 F (36.6 C) 98 F (36.7 C)  TempSrc: Axillary  Axillary Axillary  Resp: 16   18  Height:      Weight:  49.4 kg (108 lb 14.5 oz)    SpO2: 100%  98% 100%    Intake/Output Summary (Last 24 hours) at 07/01/13 1831 Last data filed at 07/01/13 1800  Gross per 24 hour  Intake 3864.83 ml  Output   1975 ml  Net 1889.83 ml    Exam:   General:  Pt is alert, follows commands appropriately, not in acute distress  Cardiovascular: Regular rate and rhythm, S1/S2, no murmurs, no rubs, no gallops  Respiratory: Clear to auscultation bilaterally, no wheezing, no crackles, no rhonchi  Abdomen: Soft, non tender, J tube in place, (+) BS  Extremities: No edema, pulses DP and PT palpable bilaterally  Neuro: Grossly nonfocal  Data Reviewed: Basic Metabolic Panel:  Recent Labs Lab 06/27/13 1745 06/28/13 0505 06/29/13 0445 06/30/13 1150 07/01/13 0600  NA 134* 137 132* 126* 130*  K 3.8 3.9 4.0 3.4* 3.6  CL 100 104 97 93* 95*  CO2 25 26 28 25 28   GLUCOSE 116* 115* 128* 134* 134*  BUN 20 16 7  5* 6  CREATININE  0.34* 0.35* 0.34* 0.31* 0.31*  CALCIUM 9.0 8.8 9.3 8.8 8.6  MG 1.8  --   --   --   --   PHOS 2.9  --   --   --   --    Liver Function Tests: No results found for this basename: AST, ALT, ALKPHOS, BILITOT, PROT, ALBUMIN,  in the last 168 hours No results found for this basename: LIPASE, AMYLASE,  in the last 168 hours No results found for this basename: AMMONIA,  in the last 168 hours CBC:  Recent Labs Lab 06/27/13 1745 06/29/13 0445 06/30/13 1150  07/01/13 0600  WBC 12.4* 14.1* 12.0* 10.2  NEUTROABS 10.9*  --   --   --   HGB 7.8* 8.1* 7.8* 7.8*  HCT 24.1* 24.8* 23.9* 23.9*  MCV 85.8 84.9 84.8 84.5  PLT 365 439* 433* 459*   Cardiac Enzymes: No results found for this basename: CKTOTAL, CKMB, CKMBINDEX, TROPONINI,  in the last 168 hours BNP: No components found with this basename: POCBNP,  CBG:  Recent Labs Lab 07/01/13 0018 07/01/13 0527 07/01/13 0758 07/01/13 1207 07/01/13 1601  GLUCAP 141* 127* 125* 111* 114*    Recent Results (from the past 240 hour(s))  SURGICAL PCR SCREEN     Status: None   Collection Time    06/28/13 10:46 AM      Result Value Range Status   MRSA, PCR NEGATIVE  NEGATIVE Final   Staphylococcus aureus NEGATIVE  NEGATIVE Final   Comment:            The Xpert SA Assay (FDA     approved for NASAL specimens     in patients over 63 years of age),     is one component of     a comprehensive surveillance     program.  Test performance has     been validated by [employer] for patients greater     than or equal to 29 year old.     It is not intended     to diagnose infection nor to     guide or monitor treatment.     Studies: No results found.  Scheduled Meds: . enoxaparin (LOVENOX) injection  40 mg Subcutaneous Q24H  . free water  150 mL Per Tube Q4H  . nicotine  14 mg Transdermal Daily   Continuous Infusions: . dextrose 5 % and 0.9% NaCl 50 mL/hr at 07/01/13 0552  . feeding supplement (OSMOLITE 1.5 CAL) 1,000 mL (07/01/13 1632)

## 2013-07-01 NOTE — Progress Notes (Signed)
3 Days Post-Op  Subjective: Mild discomfort around J-tube site, but tolerating feedings well without nausea, vomiting, or bloating. Normal voiding but no BM. Has flatulence.  Ambulating some, but encouraged to do more.   Objective: Vital signs in last 24 hours: Temp:  [97.3 F (36.3 C)-98.3 F (36.8 C)] 97.9 F (36.6 C) (11/14 0521) Pulse Rate:  [102-115] 109 (11/14 0521) Resp:  [16] 16 (11/13 2207) BP: (109-136)/(78-93) 109/78 mmHg (11/14 0521) SpO2:  [98 %-100 %] 98 % (11/14 0521) Weight:  [108 lb 14.5 oz (49.4 kg)] 108 lb 14.5 oz (49.4 kg) (11/14 0340) Last BM Date: 06/27/13  Intake/Output from previous day: 11/13 0701 - 11/14 0700 In: 2198.8 [I.V.:1635.8; NG/GT:563] Out: 2825 [Urine:2525; Drains:300] Intake/Output this shift:    PE: Gen:  Alert, NAD, pleasant Abdomen: BS normal, tenderness to palpation around incision. Incision is clean, dry and intact with staples in place. j-tube in place in the left abdomen.   Lab Results:   Recent Labs  06/30/13 1150 07/01/13 0600  WBC 12.0* 10.2  HGB 7.8* 7.8*  HCT 23.9* 23.9*  PLT 433* 459*   BMET  Recent Labs  06/30/13 1150 07/01/13 0600  NA 126* 130*  K 3.4* 3.6  CL 93* 95*  CO2 25 28  GLUCOSE 134* 134*  BUN 5* 6  CREATININE 0.31* 0.31*  CALCIUM 8.8 8.6   PT/INR No results found for this basename: LABPROT, INR,  in the last 72 hours CMP     Component Value Date/Time   NA 130* 07/01/2013 0600   NA 140 06/23/2013 1125   K 3.6 07/01/2013 0600   K 4.1 06/23/2013 1125   CL 95* 07/01/2013 0600   CO2 28 07/01/2013 0600   CO2 20* 06/23/2013 1125   GLUCOSE 134* 07/01/2013 0600   GLUCOSE 104 06/23/2013 1125   BUN 6 07/01/2013 0600   BUN 26.4* 06/23/2013 1125   CREATININE 0.31* 07/01/2013 0600   CREATININE 0.7 06/23/2013 1125   CALCIUM 8.6 07/01/2013 0600   CALCIUM 10.6* 06/23/2013 1125   PROT 7.7 06/23/2013 1125   PROT 8.2 12/04/2011 1505   ALBUMIN 3.7 06/23/2013 1125   ALBUMIN 3.6 12/04/2011 1505   AST 17  06/23/2013 1125   AST 14 12/04/2011 1505   ALT 8 06/23/2013 1125   ALT 8 12/04/2011 1505   ALKPHOS 64 06/23/2013 1125   ALKPHOS 58 12/04/2011 1505   BILITOT 0.36 06/23/2013 1125   BILITOT 0.2* 12/04/2011 1505   GFRNONAA >90 07/01/2013 0600   GFRAA >90 07/01/2013 0600   Lipase  No results found for this basename: lipase       Studies/Results: No results found.  Anti-infectives: Anti-infectives   Start     Dose/Rate Route Frequency Ordered Stop   06/28/13 1000  [MAR Hold]  ciprofloxacin (CIPRO) IVPB 400 mg     (On MAR Hold since 06/28/13 1103)  Comments:  On call or   400 mg 200 mL/hr over 60 Minutes Intravenous On call to O.R. 06/28/13 0931 06/28/13 1136   06/28/13 1000  metroNIDAZOLE (FLAGYL) IVPB 500 mg  Status:  Discontinued    Comments:  On call OR   500 mg 100 mL/hr over 60 Minutes Intravenous On call to O.R. 06/28/13 0931 06/28/13 0935       Assessment/Plan POD #3 Hx of esophageal cancer, malnutrition and dysphagia s/p open jejunostomy tube placement, 06/28/13 Gaynelle Adu  1. Dehydration - IVF, feeding supplement. Continue strick I/Os. 2. Protein-calorie malnutrition, severe - patient encouraged to drink ensure.  Tube feeds at goal (50cc/hr) and continue to monitor. POs as tolerated. 3. Leukocytosis - resolved. 4. Anemia, acute on chronic anemia - continue to monitor for signs/symptoms of anemia. Recheck CBC tomorrow. 5. VTE proph - SCDs, ambulate as tolerated. 6. Dispo - discharge to SNF rehab when medically stable.     LOS: 4 days    Marybeth Dandy 07/01/2013, 11:39 AM Pager: [phone]

## 2013-07-01 NOTE — Progress Notes (Signed)
Hospital D/C today. Needs office visit prior to his chemo on 11/19. POF to scheduler.

## 2013-07-01 NOTE — Progress Notes (Signed)
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 S/P open jejunostomy placed on 06/28/13.   PT Comments   Pt feeling better but still c/o ABD pain at surgical site.  Assisted OOB to amb in hallway then back to bed per pt request.   Follow Up Recommendations  CIR     Does the patient have the potential to tolerate intense rehabilitation     Barriers to Discharge        Equipment Recommendations  Rolling walker with 5" wheels    Recommendations for Other Services    Frequency Min 3X/week   Progress towards PT Goals Progress towards PT goals: Progressing toward goals  Plan      Precautions / Restrictions Precautions Precaution Comments: dysphagia, R port, abd tube Restrictions Weight Bearing Restrictions: No    Pertinent Vitals/Pain C/o "some" ABD pain Pre medicated     Mobility  Bed Mobility Bed Mobility: Supine to Sit;Sit to Supine Supine to Sit: 6: Modified independent (Device/Increase time) Sit to Supine: 6: Modified independent (Device/Increase time) Details for Bed Mobility Assistance: increased time Transfers Transfers: Sit to Stand;Stand to Sit Sit to Stand: 4: Min assist;From bed Stand to Sit: 4: Min assist;To bed Details for Transfer Assistance: 25% VC's on safety with turns as pt was impulsive Ambulation/Gait Ambulation/Gait Assistance: 4: Min assist Ambulation Distance (Feet): 120 Feet Assistive device: Rolling walker Ambulation/Gait Assistance Details: used a RW for increased stability.  Unsteady gait.  Limited activity tolerance. 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 S/P open jejunostomy placed on 06/28/13.    Subjective Data      Cognition       Balance     End of Session PT - End of Session Equipment Utilized During Treatment: Gait belt Activity Tolerance: Patient tolerated treatment well;Patient limited by fatigue Patient left: in bed;with call bell/phone within reach;with bed alarm set   Felecia Shelling  PTA [facility]  Acute  Rehab Pager      [phone]

## 2013-07-01 NOTE — Progress Notes (Addendum)
NUTRITION FOLLOW UP  Pt meets criteria for severe MALNUTRITION in the context of chronic illness as evidenced by <75% estimated energy intake for over 1 month with severe depletion of fat and muscle stores.  Intervention:   - Continue Osmolite 1.5 TF formula via J-tube at goal rate of 50 ml/hr. Goal rate provides 1800 kcal, 75 g protein, 914 ml free water and will meet 100% of estimated calorie and protein needs.  - Continue 150 ml water flushes 6 times/day. Clarified this order today by adding to the med section of medical record per discussion with pharmacist. - RD will continue to monitor.   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 Lenis Noon at [facility] for surgical resection after he completes his chemotherapy (had 1st chemo 06/22/13) [name] (has had 3 treatments so far), who was referred as a direct admission by Dr. Dayton Scrape after he refused his radiation treatment today, secondary to concerns for dehydration. Mr. Labella has not been able to take in much PO, he is on a liquid diet secondary to dysphagia. He has been unable to consistently take in POs, no complaints of frank vomiting but has refluxing of liquids up after attempts to drink. He has only been able to take in 2 cans of Osmolite 1.5 a day with minimal fluid intake otherwise. Dr. Truett Perna suggests a surgical referral for placement of a surgical J tube since, if he tolerates  current treatment, the plan is to pursue esophagectomy. Hx of PEG from April 2013-March 2014 r/t treatment for tongue CA. Has been followed by outpatient [facility] RD. S/p open J tube placement yesterday.   Pt is currently tolerating TF at a rate of 50 ml/hr with 150 ml flushes every four hours. Per RN, pt will be advanced by 10 ml every 4 hours to goal of 50 ml/hr. Per RN, pt is having very little to no residuals. Patient states that he is not taking po due to feeling full and pain.  Height: Ht Readings from Last 1 Encounters:  06/27/13 5\' 6"  (1.676 m)    Weight Status:   Wt Readings from Last 1 Encounters:  07/01/13 108 lb 14.5 oz (49.4 kg)    Re-estimated needs:  Kcal: 1650-1900 Protein: 75-85 g Fluid: 1.7-1.9 L  Skin: incision on abdomen  Diet Order: Clear Liquid   Intake/Output Summary (Last 24 hours) at 07/01/13 1221 Last data filed at 07/01/13 1125  Gross per 24 hour  Intake 2198.83 ml  Output   2750 ml  Net -551.17 ml    Last BM: none recorded   Labs:   Recent Labs Lab 06/27/13 1745  06/29/13 0445 06/30/13 1150 07/01/13 0600  NA 134*  < > 132* 126* 130*  K 3.8  < > 4.0 3.4* 3.6  CL 100  < > 97 93* 95*  CO2 25  < > 28 25 28   BUN 20  < > 7 5* 6  CREATININE 0.34*  < > 0.34* 0.31* 0.31*  CALCIUM 9.0  < > 9.3 8.8 8.6  MG 1.8  --   --   --   --   PHOS 2.9  --   --   --   --   GLUCOSE 116*  < > 128* 134* 134*  < > = values in this interval not displayed.  CBG (last 3)   Recent Labs  07/01/13 0527 07/01/13 0758 07/01/13 1207  GLUCAP 127* 125* 111*    Scheduled Meds: . enoxaparin (LOVENOX) injection  40 mg Subcutaneous Q24H  . free water  150 mL Per Tube Q4H  . nicotine  14 mg Transdermal Daily    Continuous Infusions: . dextrose 5 % and 0.9% NaCl 50 mL/hr at 07/01/13 0552  . feeding supplement (OSMOLITE 1.5 CAL) 1,000 mL (07/01/13 0555)    Oran Rein, RD, LDN Clinical Inpatient Dietitian Pager:  [phone] Weekend and after  hours pager:  [phone]

## 2013-07-01 NOTE — Op Note (Signed)
Henry Leonard, Henry Leonard               ACCOUNT NO.:  [id number]  MEDICAL RECORD NO.:  [id number]  LOCATION:  1306                         FACILITY:  [facility]  PHYSICIAN:  Mary Sella. Andrey Campanile, MD, FACSDATE OF BIRTH:  22-Dec-1955  DATE OF PROCEDURE:  06/28/2013 DATE OF DISCHARGE:                              OPERATIVE REPORT   PREOPERATIVE DIAGNOSES: 1. Dysphagia. 2. Esophageal cancer. 3. Protein-calorie malnutrition.  POSTOPERATIVE DIAGNOSIS: 1. Dysphagia. 2. Esophageal cancer. 3. Protein-calorie malnutrition.  PROCEDURE:  Open jejunostomy tube placement.  SURGEON:  Mary Sella. Andrey Campanile, MD, FACS  ASSISTANT:  None.  ANESTHESIA:  General.  ESTIMATED BLOOD LOSS:  Minimal.  FINDINGS:  Placement of a 14-French red rubber tube into the jejunum approximately 40 cm distal to the ligament of Treitz.  INDICATIONS FOR PROCEDURE:  The patient is an unfortunate 57 year old gentleman who had tongue cancer last summer who underwent chemotherapy and radiation followed by tongue resection with reconstruction with a free flap as well as bilateral neck dissection.  Unfortunately, he developed esophageal cancer late this summer.  He was diagnosed to have T3, N1 disease.  He was undergoing neoadjuvant chemotherapy prior to hopeful esophagectomy on down the road.  Unfortunately, the patient has had issues with tolerating oral intake.  Normally, he has been able to take 5-6 cups of protein shakes per day prior to his esophageal cancer diagnosis, but since his diagnosis, he has had progressive difficulty tolerating oral intake.  Prior to the neoadjuvant treatment session this week, he was found to be severely dehydrated, which prompted admission to the hospital and Surgical Service was consulted for J-tube placement. I attempted to contact the surgical oncologist, Dr. Lenis Noon, at [facility], but he is on vacation this week.  I talked to one of his partners who asked Henry Leonard to go ahead and place the J-tube to  expedite the patient's care.  I have discussed the risks and benefits of surgery with the patient extensively including, but not limited to, bleeding, infection, injury to surrounding structures, tube malposition, tube clogging, need for additional procedures, skin breakdown, incisional hernia, wound infection, as well as bowel obstruction, as well as cardiac and pulmonary perioperative complications.  The patient elected to proceed with surgery.  DESCRIPTION OF PROCEDURE:  The patient was taken to the operating room #6 at [facility] and placed supine on the operating table. General endotracheal anesthesia was established.  His abdomen was prepped and draped in usual standard surgical fashion.  A surgical time- out was performed.  He received Cipro and Flagyl prior to skin incision. A midline incision was made extending for about 3 inches above his umbilicus.  The skin was incised with a #10 blade.  Subcutaneous tissue was divided with electrocautery.  The fascia was opened and the abdominal cavity was entered.  He had a few omental attachments, which were taken down with electrocautery.  I was able to reflect the omentum and the transverse colon, identified the ligament of Treitz.  There was no gross evidence of carcinomatosis or peritoneal studding.  I identified the ligament Treitz than measured approximately 40 cm downstream and placed the Babcock in this area.  I reconfirmed the anatomy, re-identifying the ligament  Treitz.  A small incision was made in the left mid abdomen just lateral to the rectus sheath with a #15 blade.  Tonsil was then used to go through the abdominal wall and grab the end of a 14-French red rubber and brought it through the abdominal wall into the abdomen.  A pursestring suture of 3-0 silk was placed in the anti-mesenteric section of the jejunum and an outer pursestring was also placed with 2-0 silk suture.  An enterotomy was made in the center of  the pursestring and the red rubber was advanced and threaded downstream for about 20 cm.  I then tied down the pursestring securing the tube.  I then whittzeled the J-tube in standard fashion.   3-0 silk sutures were used to Lembert the red rubber upstream from its insertion site.  I then tacked the jejunum in 3 places to the abdominal wall using 2-0 silk sutures around its insertion site into the abdominal cavity.  I then tacked in 3 separate places further downstream in order between the jejunum and the abdominal wall in order prevent twisting or kinking. The omentum was returned to the abdomen.  The J tube flushed easily. The abdominal cavity was closed with 2 running #1 PDS sutures.  The subcutaneous tissue was irrigated and the skin was reapproximated with skin staples.  The tube was secured to the skin with a 3-0 nylon as well with a 2-0 silk suture.  Dressings were applied.  The patient was extubated and taken to the recovery in stable condition.  There were no immediate complications.  The patient tolerated the procedure well.     Mary Sella. Andrey Campanile, MD, FACS     EMW/MEDQ  D:  06/28/2013  T:  06/29/2013  Job:  [id number]  cc:   Herbie Saxon, MD Fax: [phone]  Ladene Artist, M.D. Fax: [phone]

## 2013-07-02 LAB — GLUCOSE, CAPILLARY
Glucose-Capillary: 122 mg/dL — ABNORMAL HIGH (ref 70–99)
Glucose-Capillary: 136 mg/dL — ABNORMAL HIGH (ref 70–99)
Glucose-Capillary: 97 mg/dL (ref 70–99)

## 2013-07-02 MED ORDER — CHLORHEXIDINE GLUCONATE 0.12 % MT SOLN
15.0000 mL | Freq: Two times a day (BID) | OROMUCOSAL | Status: DC
  Administered 2013-07-03: 15 mL via OROMUCOSAL
  Filled 2013-07-02 (×3): qty 15

## 2013-07-02 MED ORDER — BIOTENE DRY MOUTH MT LIQD
15.0000 mL | Freq: Two times a day (BID) | OROMUCOSAL | Status: DC
  Administered 2013-07-03: 15 mL via OROMUCOSAL

## 2013-07-02 NOTE — Progress Notes (Signed)
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 Lenis Noon at [facility] for surgical resection after he completes his chemotherapy (had 1st chemo 06/22/13) Henry Leonard (has had 3 treatments so far), who was referred as a direct admission on 06/27/2013, by Dr. Dayton Scrape after he declined having his radiation treatment, secondary to concerns for dehydration. He has been seen and evaluated by surgery and has had J tube placed with no subsequent complications.   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 Now has J tube  - PO intake as tolerated  Tobacco abuse  - Counseled. Ordered nicotine patch   Code Status: Full.  Family Communication: No family at the bedside.  Disposition Plan: pt declines rehab placement and wants to go home in am when his brother comes; order placed for HHPT/OT. RN and aide; SW order placed as well  IV access:   Port-A-Cath right chest wall. Medical Consultants:  Dr. Mancel Bale, Oncology  Dr. Gaynelle Adu, Surgery Other Consultants:  Dietitian Anti-infectives:  None.   Manson Passey, MD  [facility] Pager [phone]  If 7PM-7AM, please contact night-coverage [email or web address] Password [facility] 07/02/2013, 10:34 AM   LOS: 5 days    HPI/Subjective: No overnight events. Some soreness at the J tube site.   Objective: Filed Vitals:   07/01/13 0521 07/01/13 1400 07/01/13 2005 07/02/13 0408  BP: 109/78 113/79 113/83 102/71  Pulse: 109 104 108 97  Temp: 97.9 F (36.6 C) 98 F (36.7 C) 97.8 F (36.6 C) 97.4 F (36.3 C)  TempSrc: Axillary Axillary Axillary Axillary  Resp:  18 19   Height:      Weight:      SpO2: 98% 100% 98% 99%    Intake/Output Summary (Last 24 hours) at 07/02/13 1034 Last data filed at 07/02/13 0408  Gross per 24 hour  Intake   1716 ml  Output   1575 ml  Net    141 ml    Exam:   General:  Pt is alert, follows commands appropriately, not in acute distress  Cardiovascular: Regular rate and rhythm, S1/S2 appreciated  Respiratory: Clear to auscultation bilaterally, no wheezing, no crackles, no rhonchi  Abdomen: Soft, non tender, non distended, bowel sounds present, feeding tube in place  Extremities: No edema, pulses DP and PT palpable bilaterally  Neuro: Grossly nonfocal  Data Reviewed: Basic Metabolic Panel:  Recent Labs Lab 06/27/13 1745 06/28/13 0505 06/29/13 0445 06/30/13 1150 07/01/13 0600  NA 134* 137 132* 126* 130*  K 3.8  3.9 4.0 3.4* 3.6  CL 100 104 97 93* 95*  CO2 25 26 28 25 28   GLUCOSE 116* 115* 128* 134* 134*  BUN 20 16 7  5* 6  CREATININE 0.34* 0.35* 0.34* 0.31* 0.31*  CALCIUM 9.0 8.8 9.3 8.8 8.6  MG 1.8  --   --   --   --   PHOS 2.9  --   --   --   --    Liver Function Tests: No results found for this basename: AST, ALT, ALKPHOS, BILITOT, PROT, ALBUMIN,  in the last 168 hours No results found for this basename: LIPASE,  AMYLASE,  in the last 168 hours No results found for this basename: AMMONIA,  in the last 168 hours CBC:  Recent Labs Lab 06/27/13 1745 06/29/13 0445 06/30/13 1150 07/01/13 0600  WBC 12.4* 14.1* 12.0* 10.2  NEUTROABS 10.9*  --   --   --   HGB 7.8* 8.1* 7.8* 7.8*  HCT 24.1* 24.8* 23.9* 23.9*  MCV 85.8 84.9 84.8 84.5  PLT 365 439* 433* 459*   Cardiac Enzymes: No results found for this basename: CKTOTAL, CKMB, CKMBINDEX, TROPONINI,  in the last 168 hours BNP: No components found with this basename: POCBNP,  CBG:  Recent Labs Lab 07/01/13 1601 07/01/13 2003 07/01/13 2358 07/02/13 0405 07/02/13 0728  GLUCAP 114* 107* 105* 126* 136*    Recent Results (from the past 240 hour(s))  SURGICAL PCR SCREEN     Status: None   Collection Time    06/28/13 10:46 AM      Result Value Range Status   MRSA, PCR NEGATIVE  NEGATIVE Final   Staphylococcus aureus NEGATIVE  NEGATIVE Final   Comment:            The Xpert SA Assay (FDA     approved for NASAL specimens     in patients over 60 years of age),     is one component of     a comprehensive surveillance     program.  Test performance has     been validated by [employer] for patients greater     than or equal to 38 year old.     It is not intended     to diagnose infection nor to     guide or monitor treatment.     Studies: No results found.  Scheduled Meds: . enoxaparin (LOVENOX) injection  40 mg Subcutaneous Q24H  . free water  150 mL Per Tube Q4H  . nicotine  14 mg Transdermal Daily   Continuous Infusions: . dextrose 5 % and 0.9% NaCl 50 mL/hr at 07/02/13 0006  . feeding supplement (OSMOLITE 1.5 CAL) 1,000 mL (07/01/13 1632)

## 2013-07-02 NOTE — Progress Notes (Signed)
General Surgery Note  LOS: 5 days  POD -  4 Days Post-Op  Assessment/Plan: 1.  OPEN JEJUNOSTOMY TUBE PLACEMENT - 06/28/2013 - E. Wilson  Tolerating tube feedings at 50 cc/hour  Okay to go home from our standpoint.  2.  Carcinoma of distal 1/3 esophagus  plans to followup with Dr. Lenis Noon at [facility] for surgical resection  3.  Locally advance tongue cancer - 2013 4. Dehydration - doing better 5. Protein-calorie malnutrition, severe - patient encouraged to drink ensure. Tube feeds at goal (50cc/hr). POs as tolerated.  6. Anemia, acute on chronic anemia -   Hgb - 7.8 - 07/01/2013 7.  DVT prophylaxis - Lovenox   Principal Problem:   Dehydration Active Problems:   Leucocytosis   Carcinoma of distal third of esophagus   Anemia of chronic disease   Protein-calorie malnutrition, severe   Dysphasia   Tobacco abuse  Subjective:  Doing okay.  No obvious issues. Objective:   Filed Vitals:   07/02/13 0408  BP: 102/71  Pulse: 97  Temp: 97.4 F (36.3 C)  Resp:      Intake/Output from previous day:  11/14 0701 - 11/15 0700 In: 1716 [P.O.:50; I.V.:571; NG/GT:1095] Out: 1575 [Urine:1575]  Intake/Output this shift:      Physical Exam:   General: WN older WM who is alert and oriented.  Because of his prior tongue Ca, he is a little hard to understand.   HEENT: Normal. Pupils equal. .   Lungs: Clear.   Abdomen: Soft.  Tube feedings running.   Lab Results:    Recent Labs  06/30/13 1150 07/01/13 0600  WBC 12.0* 10.2  HGB 7.8* 7.8*  HCT 23.9* 23.9*  PLT 433* 459*    BMET   Recent Labs  06/30/13 1150 07/01/13 0600  NA 126* 130*  K 3.4* 3.6  CL 93* 95*  CO2 25 28  GLUCOSE 134* 134*  BUN 5* 6  CREATININE 0.31* 0.31*  CALCIUM 8.8 8.6    PT/INR  No results found for this basename: LABPROT, INR,  in the last 72 hours  ABG  No results found for this basename: PHART, PCO2, PO2, HCO3,  in the last 72 hours   Studies/Results:  No results  found.   Anti-infectives:   Anti-infectives   Start     Dose/Rate Route Frequency Ordered Stop   06/28/13 1000  [MAR Hold]  ciprofloxacin (CIPRO) IVPB 400 mg     (On MAR Hold since 06/28/13 1103)  Comments:  On call or   400 mg 200 mL/hr over 60 Minutes Intravenous On call to O.R. 06/28/13 0931 06/28/13 1136   06/28/13 1000  metroNIDAZOLE (FLAGYL) IVPB 500 mg  Status:  Discontinued    Comments:  On call OR   500 mg 100 mL/hr over 60 Minutes Intravenous On call to O.R. 06/28/13 0931 06/28/13 0935      Ovidio Kin, MD, FACS Pager: [phone] [facility] Office: [phone] 07/02/2013

## 2013-07-03 ENCOUNTER — Other Ambulatory Visit: Payer: Self-pay | Admitting: Oncology

## 2013-07-03 LAB — GLUCOSE, CAPILLARY
Glucose-Capillary: 106 mg/dL — ABNORMAL HIGH (ref 70–99)
Glucose-Capillary: 113 mg/dL — ABNORMAL HIGH (ref 70–99)
Glucose-Capillary: 95 mg/dL (ref 70–99)

## 2013-07-03 MED ORDER — CHLORHEXIDINE GLUCONATE 0.12 % MT SOLN: 15.0000 mL | Freq: Two times a day (BID) | OROMUCOSAL | Status: DC

## 2013-07-03 MED ORDER — OXYCODONE-ACETAMINOPHEN 5-325 MG PO TABS: 1.0000 | ORAL_TABLET | ORAL | Status: DC | PRN

## 2013-07-03 MED ORDER — NICOTINE 14 MG/24HR TD PT24: 14.0000 mg | MEDICATED_PATCH | Freq: Every day | TRANSDERMAL | Status: AC

## 2013-07-03 MED ORDER — BIOTENE DRY MOUTH MT LIQD: 15.0000 mL | Freq: Two times a day (BID) | OROMUCOSAL | Status: DC

## 2013-07-03 MED ORDER — MORPHINE SULFATE (CONCENTRATE) 10 MG /0.5 ML PO SOLN: 10.0000 mg | ORAL | Status: DC | PRN

## 2013-07-03 MED ORDER — FREE WATER: 150.0000 mL | Status: DC

## 2013-07-03 MED ORDER — MAGNESIUM HYDROXIDE 400 MG/5ML PO SUSP: 30.0000 mL | Freq: Every day | ORAL | Status: DC | PRN

## 2013-07-03 MED ORDER — LORAZEPAM 2 MG/ML IJ SOLN
1.0000 mg | Freq: Once | INTRAMUSCULAR | Status: AC
  Administered 2013-07-03: 1 mg via INTRAVENOUS
  Filled 2013-07-03: qty 1

## 2013-07-03 MED ORDER — ACETAMINOPHEN 160 MG/5ML PO SOLN: 650.0000 mg | ORAL | Status: DC | PRN

## 2013-07-03 MED ORDER — ONDANSETRON HCL 4 MG PO TABS: 4.0000 mg | ORAL_TABLET | Freq: Four times a day (QID) | ORAL | Status: DC | PRN

## 2013-07-03 MED ORDER — OSMOLITE PO LIQD: 237.0000 mL | ORAL | Status: AC

## 2013-07-03 MED ORDER — HEPARIN SOD (PORK) LOCK FLUSH 100 UNIT/ML IV SOLN
500.0000 [IU] | INTRAVENOUS | Status: DC | PRN
  Filled 2013-07-03: qty 5

## 2013-07-03 MED ORDER — OSMOLITE 1.5 CAL PO LIQD: 1000.0000 mL | ORAL | Status: DC

## 2013-07-03 MED ORDER — HYDROCODONE-ACETAMINOPHEN 7.5-325 MG/15ML PO SOLN: 10.0000 mL | ORAL | Status: DC | PRN

## 2013-07-03 MED ORDER — PROMETHAZINE HCL 6.25 MG/5ML PO SYRP: 12.5000 mg | ORAL_SOLUTION | ORAL | Status: DC | PRN

## 2013-07-03 NOTE — Progress Notes (Signed)
General Surgery Note  LOS: 6 days  POD -  5 Days Post-Op  Assessment/Plan: 1.  OPEN JEJUNOSTOMY TUBE PLACEMENT - 06/28/2013 - E. Wilson  Tolerating tube feedings at 50 cc/hour  Apparently tube got clogged once yesterday, working okay now.  Okay to go home from our standpoint.  2.  Carcinoma of distal 1/3 esophagus  Plans to followup with Dr. Lenis Noon at [facility] for surgical resection  3.  Locally advance tongue cancer - 2013 4.  Dehydration - doing better 5.  Protein-calorie malnutrition, severe.  Tube feeds at goal (50cc/hr). POs as tolerated.  6.  Anemia, acute on chronic anemia -   Hgb - 7.8 - 07/01/2013 (no labs today) 7.  DVT prophylaxis - Lovenox   Principal Problem:   Dehydration Active Problems:   Leucocytosis   Carcinoma of distal third of esophagus   Anemia of chronic disease   Protein-calorie malnutrition, severe   Dysphasia   Tobacco abuse  Subjective:  Doing okay.  No obvious issues.  I think he wants tog et out of the hospital. Objective:   Filed Vitals:   07/03/13 0400  BP: 113/81  Pulse: 106  Temp: 97.4 F (36.3 C)  Resp: 18     Intake/Output from previous day:  11/15 0701 - 11/16 0700 In: 220 [P.O.:220] Out: 1725 [Urine:1725]  Intake/Output this shift:      Physical Exam:   General: WN older WM who is alert and oriented.  Because of his prior tongue Ca, he is a little hard to understand.   HEENT: Normal. Pupils equal. .   Lungs: Clear.   Abdomen: Soft.  Tube feedings running.   Lab Results:     Recent Labs  06/30/13 1150 07/01/13 0600  WBC 12.0* 10.2  HGB 7.8* 7.8*  HCT 23.9* 23.9*  PLT 433* 459*    BMET    Recent Labs  06/30/13 1150 07/01/13 0600  NA 126* 130*  K 3.4* 3.6  CL 93* 95*  CO2 25 28  GLUCOSE 134* 134*  BUN 5* 6  CREATININE 0.31* 0.31*  CALCIUM 8.8 8.6    PT/INR  No results found for this basename: LABPROT, INR,  in the last 72 hours  ABG  No results found for this basename: PHART, PCO2, PO2, HCO3,   in the last 72 hours   Studies/Results:  No results found.   Anti-infectives:   Anti-infectives   Start     Dose/Rate Route Frequency Ordered Stop   06/28/13 1000  [MAR Hold]  ciprofloxacin (CIPRO) IVPB 400 mg     (On MAR Hold since 06/28/13 1103)  Comments:  On call or   400 mg 200 mL/hr over 60 Minutes Intravenous On call to O.R. 06/28/13 0931 06/28/13 1136   06/28/13 1000  metroNIDAZOLE (FLAGYL) IVPB 500 mg  Status:  Discontinued    Comments:  On call OR   500 mg 100 mL/hr over 60 Minutes Intravenous On call to O.R. 06/28/13 0931 06/28/13 0935      Ovidio Kin, MD, FACS Pager: [phone] [facility] Office: [phone] 07/03/2013

## 2013-07-03 NOTE — Progress Notes (Signed)
   CARE MANAGEMENT NOTE 07/03/2013  Patient:  RAYNELL, SCOTT   Account Number:  [id number]  Date Initiated:  06/28/2013  Documentation initiated by:  DAVIS,TYMEEKA  Subjective/Objective Assessment:   57 yo male admitted with dehydration. J-tube placment 06/28/13     Action/Plan:   Home when stable   Anticipated DC Date:  07/03/2013   Anticipated DC Plan:  HOME/SELF CARE      DC Planning Services  CM consult      Choice offered to / List presented to:  NA   DME arranged  TUBE FEEDING PUMP  TUBE FEEDING      DME agency  [facility]     [facility] arranged  HH-1 RN  HH-6 SOCIAL WORKER  HH-4 NURSE'S AIDE      HH agency  [facility]   Status of service:  Completed, signed off Medicare Important Message given?   (If response is "NO", the following Medicare IM given date fields will be blank) Date Medicare IM given:   Date Additional Medicare IM given:    Discharge Disposition:  HOME W HOME HEALTH SERVICES  Per UR Regulation:  Reviewed for med. necessity/level of care/duration of stay  If discussed at Long Length of Stay Meetings, dates discussed:    Comments:  07/03/2013 1400 NCM faxed tube feeding Rx to [facility]. [facility] will arrange [facility] RN to visit today once pt arrives home. Isidoro Donning RN CCM Case Mgmt phone [phone]  07/01/13 1144 Tymeeka Davis,RN,MSN [phone] Pt recommendation for CIR. Md order for IP rehab consult. Awating decision of appropriatness by CIR MD.  06/28/13 1143 Tymeeka Davis,RN,MSN [phone] Chart reviewed for utilization of services. No needs identified at this time. Will continue to follow post surgical intervention.

## 2013-07-03 NOTE — Progress Notes (Signed)
Received call from Unit RN stating [facility] called pt to let him know they will set up for feedings 11/17. NCM contacted [facility] to make them aware pt is discharging home today 1700 and arrange [facility] RN and feedings for soc 11/16. Spoke to [facility] RN scheduler and they will have [facility] RN set up this evening. NCM explained to pt to call [facility] as soon as he arrive home this evening. Isidoro Donning RN CCM Case Mgmt phone [phone]

## 2013-07-03 NOTE — Discharge Summary (Signed)
Physician Discharge Summary  Henry Leonard ZOX:[id number] DOB: September 10, 1955 DOA: 06/27/2013  PCP: Buckner Malta, MD  Admit date: 06/27/2013 Discharge date: 07/03/2013  Recommendations for Outpatient Follow-up:  - Continue Osmolite 1.5 TF formula via J-tube at goal rate of 50 ml/hr. Goal rate provides 1800 kcal, 75 g protein, 914 ml free water and will meet 100% of estimated calorie and protein needs.   - Continue 150 ml water flushes 6 times/day.  Discharge instructions  As directed   Comments:    Initial recommendation per physical therapy was for inpatient rehab. You have declined discharge to inpatient rehab or SNF. Discharge will proceed to home with home health PT, OT, RN and aide. Please see information in discharge instructions for tube feeds. Surgery has cleared you for discharge home today.    Discharge Diagnoses:  Principal Problem:   Dehydration Active Problems:   Leucocytosis   Carcinoma of distal third of esophagus   Anemia of chronic disease   Protein-calorie malnutrition, severe   Dysphasia   Tobacco abuse    Discharge Condition: medically stable for discharge home today with HHPT and RN/aide. 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 Lenis Noon at [facility] for surgical resection after he completes his chemotherapy (had 1st chemo 06/22/13) [name] (has had 3 treatments so far), who was referred as a direct admission on 06/27/2013, by Dr. Dayton Scrape after he declined  having his radiation treatment, secondary to concerns for dehydration. He has been seen and evaluated by surgery and has had J tube placed with no subsequent complications.   Assessment/Plan:   Principal Problem:  Dehydration secondary to inability to tolerate PO intake  - J tube placed and TF started; po intake as pt tolerates  - TF at goal rate  Active Problems:  Carcinoma of distal third of esophagus / dysphasia  - had J tube placed with TF at goal rate  - Continue radiation and weekly Taxol/carboplatin as pre oncology management  Leucocytosis  - WBC count trending up from 12.4 to 14.1  - no fevers  Anemia of chronic disease  - related to history of malignancy and sequela of chemotherapy  - transfusion tresshold - Hgb less than 7; last Hgb 7.8  Protein-calorie malnutrition, severe  - Dietitian consultation requested. Now has J tube  - PO intake as tolerated  Tobacco abuse  - Counseled. Ordered nicotine patch   Code Status: Full.  Family Communication: No family at the bedside.  Disposition Plan: pt declines rehab placement and wants to go home in am when his brother comes; order placed for HHPT/OT. RN and aide; SW order placed as well    IV access:  Port-A-Cath right chest wall. Medical Consultants:  Dr. Mancel Bale, Oncology  Dr. Gaynelle Adu, Surgery Other Consultants:  Dietitian Anti-infectives:  None.   Signed:  Manson Passey, MD  [facility] 07/03/2013, 12:16 PM  Pager #: [phone]   Discharge Exam: Filed Vitals:   07/03/13 0400  BP: 113/81  Pulse: 106  Temp: 97.4 F (36.3 C)  Resp: 18  Filed Vitals:   07/02/13 0408 07/02/13 1340 07/02/13 2012 07/03/13 0400  BP: 102/71 109/70 111/76 113/81  Pulse: 97 107 102 106  Temp: 97.4 F (36.3 C) 98 F (36.7 C) 97.8 F (36.6 C) 97.4 F (36.3 C)  TempSrc: Axillary Axillary Axillary Axillary  Resp:  18 16 18   Height:      Weight:      SpO2: 99% 100% 99% 95%    General: Pt is alert,  follows commands appropriately, not in acute distress Cardiovascular: Regular rate and rhythm, S1/S2 +, no murmurs, no rubs, no gallops Respiratory: Clear to auscultation bilaterally, no wheezing, no crackles, no rhonchi Abdominal: Soft, non tender, non distended, bowel sounds +, no guarding; J tube in place Extremities: no edema, no cyanosis, pulses palpable bilaterally DP and PT Neuro: Grossly nonfocal  Discharge Instructions  Discharge Orders   Future Appointments Provider Department Dept Phone   07/04/2013 10:55 AM [facility] [id number] [facility] [phone]   07/05/2013 10:55 AM [facility] [id number] [facility] [phone]   07/06/2013 10:50 AM [facility] [id number] [facility] [phone]   07/06/2013 11:15 AM [facility] Procedure 2 [facility] [phone]   07/06/2013 11:45 AM [facility] [phone]   07/06/2013 12:15 PM Rana Snare, NP [facility] [phone]   07/06/2013 12:45 PM Anabel Bene, RD [facility] [phone]   07/07/2013 10:55 AM [facility] [id number] [facility] [phone]   07/08/2013 10:55 AM [facility] [id number] [facility] [phone]   07/11/2013 11:00 AM [facility] [id number] [facility] [phone]   07/12/2013 11:05 AM [facility] [id number] [facility] [phone]   07/13/2013 11:00 AM [facility] [id number] [facility] [phone]   07/13/2013 11:30 AM [facility] F21 [facility] [phone]   07/13/2013 12:45 PM Anabel Bene, RD [facility] [phone]   07/18/2013 11:00 AM  [facility] [id number] [facility] [phone]   07/19/2013 11:00 AM [facility] [id number] [facility] [phone]   07/20/2013 11:00 AM [facility] [id number] [facility] [phone]   07/21/2013 11:00 AM [facility] [id number] [facility] [phone]   07/22/2013 11:00 AM [facility] [id number] [facility] [phone]   07/25/2013 11:00 AM [facility] [id number] [facility] [phone]   07/26/2013 11:00 AM [facility] [id number] [facility] [phone]   07/27/2013 11:00 AM [facility] [id number] [facility] [phone]   07/28/2013 11:00 AM [facility] [id number] [facility] [phone]   07/29/2013 11:00 AM [facility] [id number] [facility] [phone]   08/01/2013 11:00 AM [facility] [id number] [facility] [phone]   08/02/2013 11:00 AM [facility] [id number] [facility] [phone]   08/03/2013 11:00 AM [facility] [id number] [facility] [phone]   08/04/2013 11:00 AM [facility] [id number]  [facility] [phone]   08/08/2013 1:15 PM [facility] [id number]  [facility] [phone]   08/09/2013 11:00 AM [facility] [id number]  [facility] [phone]   Future Orders Complete By Expires   Call MD for:  difficulty breathing, headache or  visual disturbances  As directed    Call MD for:  persistant dizziness or light-headedness  As directed    Call MD for:  persistant nausea and vomiting  As directed    Call MD for:  severe uncontrolled pain  As directed    Diet - low  sodium heart healthy  As directed    Diet - low sodium heart healthy  As directed    Discharge instructions  As directed    Comments:     - Continue Osmolite 1.5 TF formula via J-tube at goal rate of 50 ml/hr. Goal rate provides 1800 kcal, 75 g protein, 914 ml free water and will meet 100% of estimated calorie and protein needs.   - Continue 150 ml water flushes 6 times/day.   Discharge instructions  As directed    Comments:     Initial recommendation per physical therapy was for inpatient rehab. You have declined discharge to inpatient rehab or SNF. Discharge will proceed to home with home health PT, OT, RN and aide. Please see information in discharge instructions for tube feeds. Surgery has cleared you for discharge home today.   Increase activity slowly  As directed    Increase activity slowly  As directed        Medication List         acetaminophen 160 MG/5ML solution  Commonly known as:  TYLENOL  Take 20.3 mLs (650 mg total) by mouth every 4 (four) hours as needed for mild pain.     antiseptic oral rinse Liqd  15 mLs by Mouth Rinse route 2 times daily at 12 noon and 4 pm.     chlorhexidine 0.12 % solution  Commonly known as:  PERIDEX  15 mLs by Mouth Rinse route 2 (two) times daily.     CLEAR EYES OP  Place 2 drops into both eyes daily as needed (For dry or irritated eyes.).     feeding supplement (OSMOLITE 1.5 CAL) Liqd  Place 1,000 mLs into feeding tube continuous.     OSMOLITE Liqd  Take 237 mLs by mouth See admin instructions. He drinks 3-4 cans per day.     free water Soln  Place 150 mLs into feeding tube every 4 (four) hours.     HYDROcodone-acetaminophen 7.5-325 mg/15 ml solution  Commonly known as:  HYCET  Take 10-15 mLs by mouth every 4 (four) hours as needed (pain).     lidocaine-prilocaine cream  Commonly known as:  EMLA  Apply to port a cath site one hour prior to use. Do not rub in. Cover with plastic.     magnesium hydroxide 400 MG/5ML suspension   Commonly known as:  MILK OF MAGNESIA  Place 30 mLs into feeding tube daily as needed for mild constipation or moderate constipation (okay to use J tube for this medication).     morphine CONCENTRATE 10 mg / 0.5 ml concentrated solution  Place 0.5 mLs (10 mg total) into feeding tube every 2 (two) hours as needed for severe pain.     nicotine 14 mg/24hr patch  Commonly known as:  NICODERM CQ - dosed in mg/24 hours  Place 1 patch (14 mg total) onto the skin daily.     ondansetron 4 MG tablet  Commonly known as:  ZOFRAN  Place 1 tablet (4 mg total) into feeding tube every 6 (six) hours as needed for nausea or vomiting.     oxyCODONE-acetaminophen 5-325 MG per tablet  Commonly known as:  PERCOCET/ROXICET  Place 1-2 tablets into feeding tube every 4 (four) hours as needed for moderate pain.     promethazine 6.25 MG/5ML syrup  Commonly known as:  PHENERGAN  Place 10 mLs (12.5 mg total) into feeding tube every 4 (four) hours as needed for nausea (Call if nausea not controlled.).           Follow-up Information   Follow up with Atilano Ina, MD. Call in 2 weeks.   Specialty:  General Surgery   Contact information:   [address] [place] [address] [phone]       Follow up with [facility] On 07/06/2013. (appt for chemotherapy)        The results of significant diagnostics from this hospitalization (including imaging, microbiology, ancillary and laboratory) are listed below for reference.    Significant Diagnostic Studies: Ir Fluoro Guide Cv Line Left  06/21/2013   INDICATION: History esophageal carcinoma, in need of intravenous access for chemotherapy administration  EXAM: IMPLANTED PORT A CATH PLACEMENT WITH ULTRASOUND AND FLUOROSCOPIC GUIDANCE  MEDICATIONS: Ancef 2 gm IV; IV antibiotic was given in an appropriate time interval prior to skin puncture.  ANESTHESIA/SEDATION: Versed 2 mg IV; Fentanyl 100 mcg IV;  Total Moderate Sedation Time  35   minutes.  CONTRAST:  None  COMPARISON:  None.  FLUOROSCOPY TIME:  36 seconds.  PROCEDURE: The procedure, risks, benefits, and alternatives were explained to the patient. Questions regarding the procedure were encouraged and answered. The patient understands and consents to the procedure.  The right neck and chest were prepped with chlorhexidine in a sterile fashion, and a sterile drape was applied covering the operative field. Maximum barrier sterile technique with sterile gowns and gloves were used for the procedure. A timeout was performed prior to the initiation of the procedure. Local anesthesia was provided with 1% lidocaine with epinephrine.  After creating a small venotomy incision, a micropuncture kit was utilized to access the internal jugular vein under direct, real-time ultrasound guidance. Ultrasound image documentation was performed. The microwire was kinked to measure appropriate catheter length.  A subcutaneous port pocket was then created along the upper chest wall utilizing a combination of sharp and blunt dissection. The pocket was irrigated with sterile saline. A single lumen ISP power injectable port was chosen for placement. The 8 Fr catheter was tunneled from the port pocket site to the venotomy incision. The port was placed in the pocket. The external catheter was trimmed to appropriate length. At the venotomy, an 8 Fr peel-away sheath was placed over a guidewire under fluoroscopic guidance. The catheter was then placed through the sheath and the sheath was removed. Final catheter positioning was confirmed and documented with a fluoroscopic spot radiograph. The port was accessed with a Huber needle, aspirated and flushed with heparinized saline.  The venotomy site was closed with an interrupted 4-0 Vicryl suture. The port pocket incision was closed with interrupted 2-0 Vicryl suture and the skin was opposed with a running subcuticular 4-0 Vicryl suture. Dermabond and Steri-strips were applied  to both incisions. Dressings were placed. The patient tolerated the procedure well without immediate post procedural complication.  COMPLICATIONS: None immediate  FINDINGS: After catheter placement, the tip lies within the superior cavoatrial junction. The catheter aspirates and flushes normally and is ready for immediate use.  IMPRESSION: Successful placement of a right internal jugular approach power injectable Port-A-Cath. The catheter is ready for immediate use.   Electronically Signed   By: Simonne Come  M.D.   On: 06/21/2013 15:00   Ir US Guide Vasc Access Right  06/21/2013   INDICATION: History esophageal carcinoma, in need of intravenous access for chemotherapy administration  EXAM: IMPLANTED PORT A CATH PLACEMENT WITH ULTRASOUND AND FLUOROSCOPIC GUIDANCE  MEDICATIONS: Ancef 2 gm IV; IV antibiotic was given in an appropriate time interval prior to skin puncture.  ANESTHESIA/SEDATION: Versed 2 mg IV; Fentanyl 100 mcg IV;  Total Moderate Sedation Time  35  minutes.  CONTRAST:  None  COMPARISON:  None.  FLUOROSCOPY TIME:  36 seconds.  PROCEDURE: The procedure, risks, benefits, and alternatives were explained to the patient. Questions regarding the procedure were encouraged and answered. The patient understands and consents to the procedure.  The right neck and chest were prepped with chlorhexidine in a sterile fashion, and a sterile drape was applied covering the operative field. Maximum barrier sterile technique with sterile gowns and gloves were used for the procedure. A timeout was performed prior to the initiation of the procedure. Local anesthesia was provided with 1% lidocaine with epinephrine.  After creating a small venotomy incision, a micropuncture kit was utilized to access the internal jugular vein under direct, real-time ultrasound guidance. Ultrasound image documentation was performed. The microwire was kinked to measure appropriate catheter length.  A subcutaneous port pocket was then created  along the upper chest wall utilizing a combination of sharp and blunt dissection. The pocket was irrigated with sterile saline. A single lumen ISP power injectable port was chosen for placement. The 8 Fr catheter was tunneled from the port pocket site to the venotomy incision. The port was placed in the pocket. The external catheter was trimmed to appropriate length. At the venotomy, an 8 Fr peel-away sheath was placed over a guidewire under fluoroscopic guidance. The catheter was then placed through the sheath and the sheath was removed. Final catheter positioning was confirmed and documented with a fluoroscopic spot radiograph. The port was accessed with a Huber needle, aspirated and flushed with heparinized saline.  The venotomy site was closed with an interrupted 4-0 Vicryl suture. The port pocket incision was closed with interrupted 2-0 Vicryl suture and the skin was opposed with a running subcuticular 4-0 Vicryl suture. Dermabond and Steri-strips were applied to both incisions. Dressings were placed. The patient tolerated the procedure well without immediate post procedural complication.  COMPLICATIONS: None immediate  FINDINGS: After catheter placement, the tip lies within the superior cavoatrial junction. The catheter aspirates and flushes normally and is ready for immediate use.  IMPRESSION: Successful placement of a right internal jugular approach power injectable Port-A-Cath. The catheter is ready for immediate use.   Electronically Signed   By: Simonne Come M.D.   On: 06/21/2013 15:00   Ct Outside Films Soft Tissue Neck  06/17/2013   This examination belongs to an outside facility and is stored  here for comparison purposes only.  Contact the originating outside  institution for any associated report or interpretation.   Microbiology: Recent Results (from the past 240 hour(s))  SURGICAL PCR SCREEN     Status: None   Collection Time    06/28/13 10:46 AM      Result Value Range Status   MRSA,  PCR NEGATIVE  NEGATIVE Final   Staphylococcus aureus NEGATIVE  NEGATIVE Final   Comment:            The Xpert SA Assay (FDA     approved for NASAL specimens     in patients over 71 years of age),  is one component of     a comprehensive surveillance     program.  Test performance has     been validated by [facility] for patients greater     than or equal to 33 year old.     It is not intended     to diagnose infection nor to     guide or monitor treatment.     Labs: Basic Metabolic Panel:  Recent Labs Lab 06/27/13 1745 06/28/13 0505 06/29/13 0445 06/30/13 1150 07/01/13 0600  NA 134* 137 132* 126* 130*  K 3.8 3.9 4.0 3.4* 3.6  CL 100 104 97 93* 95*  CO2 25 26 28 25 28   GLUCOSE 116* 115* 128* 134* 134*  BUN 20 16 7  5* 6  CREATININE 0.34* 0.35* 0.34* 0.31* 0.31*  CALCIUM 9.0 8.8 9.3 8.8 8.6  MG 1.8  --   --   --   --   PHOS 2.9  --   --   --   --    Liver Function Tests: No results found for this basename: AST, ALT, ALKPHOS, BILITOT, PROT, ALBUMIN,  in the last 168 hours No results found for this basename: LIPASE, AMYLASE,  in the last 168 hours No results found for this basename: AMMONIA,  in the last 168 hours CBC:  Recent Labs Lab 06/27/13 1745 06/29/13 0445 06/30/13 1150 07/01/13 0600  WBC 12.4* 14.1* 12.0* 10.2  NEUTROABS 10.9*  --   --   --   HGB 7.8* 8.1* 7.8* 7.8*  HCT 24.1* 24.8* 23.9* 23.9*  MCV 85.8 84.9 84.8 84.5  PLT 365 439* 433* 459*   Cardiac Enzymes: No results found for this basename: CKTOTAL, CKMB, CKMBINDEX, TROPONINI,  in the last 168 hours BNP: BNP (last 3 results) No results found for this basename: PROBNP,  in the last 8760 hours CBG:  Recent Labs Lab 07/02/13 2011 07/02/13 2356 07/03/13 0356 07/03/13 0729 07/03/13 1155  GLUCAP 118* 122* 106* 113* 118*    Time coordinating discharge: Over 30 minutes

## 2013-07-03 NOTE — Progress Notes (Signed)
Patient was stable at time of discharge. Deaccessed port with saline and heparin flush. Stopped tube feeding, flushed j tube, and clamped tube off with catheter stopper. Reviewed discharge education with patient. Clarified with case manager that home health would be coming to set up his tube feeds tonight. Patient verbalized understanding and did not have further questions. He understood that it was his responsibility to call home health once he reached his house.

## 2013-07-04 ENCOUNTER — Telehealth: Payer: Self-pay | Admitting: Oncology

## 2013-07-04 ENCOUNTER — Ambulatory Visit
Admission: RE | Admit: 2013-07-04 | Discharge: 2013-07-04 | Disposition: A | Discharge status: Home / Self Care | Payer: Medicaid Other | Source: Ambulatory Visit | Attending: Radiation Oncology | Admitting: Radiation Oncology

## 2013-07-04 NOTE — Telephone Encounter (Signed)
Talked to pt and gave him appt for 11/19t, lab, chemo andn ML

## 2013-07-05 ENCOUNTER — Ambulatory Visit
Admission: RE | Admit: 2013-07-05 | Discharge: 2013-07-05 | Disposition: A | Discharge status: Home / Self Care | Payer: Medicaid Other | Source: Ambulatory Visit | Attending: Radiation Oncology | Admitting: Radiation Oncology

## 2013-07-06 ENCOUNTER — Ambulatory Visit (HOSPITAL_BASED_OUTPATIENT_CLINIC_OR_DEPARTMENT_OTHER): Payer: Medicaid Other

## 2013-07-06 ENCOUNTER — Telehealth: Payer: Self-pay | Admitting: *Deleted

## 2013-07-06 ENCOUNTER — Ambulatory Visit (HOSPITAL_BASED_OUTPATIENT_CLINIC_OR_DEPARTMENT_OTHER): Payer: Medicaid Other | Admitting: Nurse Practitioner

## 2013-07-06 ENCOUNTER — Ambulatory Visit: Payer: Medicaid Other | Admitting: Nutrition

## 2013-07-06 ENCOUNTER — Ambulatory Visit
Admission: RE | Admit: 2013-07-06 | Discharge: 2013-07-06 | Disposition: A | Discharge status: Home / Self Care | Payer: Medicaid Other | Source: Ambulatory Visit | Attending: Radiation Oncology | Admitting: Radiation Oncology

## 2013-07-06 ENCOUNTER — Other Ambulatory Visit: Payer: Self-pay | Admitting: *Deleted

## 2013-07-06 ENCOUNTER — Other Ambulatory Visit (HOSPITAL_BASED_OUTPATIENT_CLINIC_OR_DEPARTMENT_OTHER): Payer: Medicaid Other | Admitting: Lab

## 2013-07-06 ENCOUNTER — Other Ambulatory Visit: Payer: Self-pay | Admitting: Lab

## 2013-07-06 ENCOUNTER — Telehealth: Payer: Self-pay | Admitting: Oncology

## 2013-07-06 VITALS — BP 104/63 | HR 105 | Temp 97.9°F

## 2013-07-06 DIAGNOSIS — R911 Solitary pulmonary nodule: Secondary | ICD-10-CM

## 2013-07-06 DIAGNOSIS — F17200 Nicotine dependence, unspecified, uncomplicated: Secondary | ICD-10-CM

## 2013-07-06 DIAGNOSIS — C029 Malignant neoplasm of tongue, unspecified: Secondary | ICD-10-CM

## 2013-07-06 DIAGNOSIS — C155 Malignant neoplasm of lower third of esophagus: Secondary | ICD-10-CM

## 2013-07-06 DIAGNOSIS — Z5111 Encounter for antineoplastic chemotherapy: Secondary | ICD-10-CM

## 2013-07-06 DIAGNOSIS — R1310 Dysphagia, unspecified: Secondary | ICD-10-CM

## 2013-07-06 LAB — CBC WITH DIFFERENTIAL/PLATELET
BASO%: 0.3 % (ref 0.0–2.0)
Basophils Absolute: 0 10*3/uL (ref 0.0–0.1)
HCT: 27 % — ABNORMAL LOW (ref 38.4–49.9)
HGB: 8.5 g/dL — ABNORMAL LOW (ref 13.0–17.1)
MCHC: 31.5 g/dL — ABNORMAL LOW (ref 32.0–36.0)
MONO#: 0.4 10*3/uL (ref 0.1–0.9)
NEUT%: 84.9 % — ABNORMAL HIGH (ref 39.0–75.0)
RBC: 3.21 10*6/uL — ABNORMAL LOW (ref 4.20–5.82)
WBC: 11.5 10*3/uL — ABNORMAL HIGH (ref 4.0–10.3)
lymph#: 1.2 10*3/uL (ref 0.9–3.3)

## 2013-07-06 LAB — COMPREHENSIVE METABOLIC PANEL (CC13)
ALT: 10 U/L (ref 0–55)
Albumin: 2.8 g/dL — ABNORMAL LOW (ref 3.5–5.0)
Anion Gap: 9 mEq/L (ref 3–11)
CO2: 25 mEq/L (ref 22–29)
Calcium: 9.5 mg/dL (ref 8.4–10.4)
Chloride: 96 mEq/L — ABNORMAL LOW (ref 98–109)
Creatinine: 0.5 mg/dL — ABNORMAL LOW (ref 0.7–1.3)
Potassium: 4.7 mEq/L (ref 3.5–5.1)
Sodium: 130 mEq/L — ABNORMAL LOW (ref 136–145)
Total Protein: 6.3 g/dL — ABNORMAL LOW (ref 6.4–8.3)

## 2013-07-06 MED ORDER — HEPARIN SOD (PORK) LOCK FLUSH 100 UNIT/ML IV SOLN
500.0000 [IU] | Freq: Once | INTRAVENOUS | Status: AC | PRN
  Administered 2013-07-06: 500 [IU]
  Filled 2013-07-06: qty 5

## 2013-07-06 MED ORDER — DIPHENHYDRAMINE HCL 50 MG/ML IJ SOLN
25.0000 mg | Freq: Once | INTRAMUSCULAR | Status: AC
  Administered 2013-07-06: 25 mg via INTRAVENOUS

## 2013-07-06 MED ORDER — SODIUM CHLORIDE 0.9 % IV SOLN
Freq: Once | INTRAVENOUS | Status: AC
  Administered 2013-07-06: 12:00:00 via INTRAVENOUS

## 2013-07-06 MED ORDER — DEXAMETHASONE SODIUM PHOSPHATE 10 MG/ML IJ SOLN
INTRAMUSCULAR | Status: AC
  Filled 2013-07-06: qty 1

## 2013-07-06 MED ORDER — SODIUM CHLORIDE 0.9 % IV SOLN
236.0000 mg | Freq: Once | INTRAVENOUS | Status: AC
  Administered 2013-07-06: 240 mg via INTRAVENOUS
  Filled 2013-07-06: qty 24

## 2013-07-06 MED ORDER — SODIUM CHLORIDE 0.9 % IJ SOLN
10.0000 mL | INTRAMUSCULAR | Status: DC | PRN
  Administered 2013-07-06: 10 mL
  Filled 2013-07-06: qty 10

## 2013-07-06 MED ORDER — DIPHENHYDRAMINE HCL 50 MG/ML IJ SOLN
INTRAMUSCULAR | Status: AC
  Filled 2013-07-06: qty 1

## 2013-07-06 MED ORDER — FAMOTIDINE IN NACL 20-0.9 MG/50ML-% IV SOLN
20.0000 mg | Freq: Once | INTRAVENOUS | Status: AC
  Administered 2013-07-06: 20 mg via INTRAVENOUS

## 2013-07-06 MED ORDER — DEXAMETHASONE 2 MG PO TABS: ORAL_TABLET | ORAL | Status: DC

## 2013-07-06 MED ORDER — PACLITAXEL CHEMO INJECTION 300 MG/50ML
50.0000 mg/m2 | Freq: Once | INTRAVENOUS | Status: AC
  Administered 2013-07-06: 78 mg via INTRAVENOUS
  Filled 2013-07-06: qty 13

## 2013-07-06 MED ORDER — ONDANSETRON 16 MG/50ML IVPB (CHCC)
16.0000 mg | Freq: Once | INTRAVENOUS | Status: AC
  Administered 2013-07-06: 16 mg via INTRAVENOUS

## 2013-07-06 MED ORDER — FAMOTIDINE IN NACL 20-0.9 MG/50ML-% IV SOLN
INTRAVENOUS | Status: AC
  Filled 2013-07-06: qty 50

## 2013-07-06 MED ORDER — DEXAMETHASONE SODIUM PHOSPHATE 10 MG/ML IJ SOLN
10.0000 mg | Freq: Once | INTRAMUSCULAR | Status: AC
  Administered 2013-07-06: 10 mg via INTRAVENOUS

## 2013-07-06 MED ORDER — ONDANSETRON 16 MG/50ML IVPB (CHCC)
INTRAVENOUS | Status: AC
  Filled 2013-07-06: qty 16

## 2013-07-06 NOTE — Progress Notes (Signed)
1330-Carboplatin tolerated with no difficulties or complaints.  VS stable.

## 2013-07-06 NOTE — Progress Notes (Signed)
Followup completed with patient in chemotherapy.  Patient reports he is tolerating Osmolite 1.5 via jejunostomy tube.  50 mL an hour continuously over 24 hours provides approximately 1800 calories, 75 g protein, and 1814 milliliters free water total.  Patient states he has not had a bowel movement for 4-5 days.  He will be adding MiraLax on a daily basis.  He has no questions or concerns regarding jejunostomy feedings.  Weight was documented as 108.9 pounds on November 14.  This is increased from 105 pounds on November 4.  Estimated nutrition needs: 1600-1800 calories, 75-85 g protein, 1.8 L fluid.  Nutrition diagnosis: Unintended weight loss improved.  Intervention: Patient to continue Osmolite 1.5 continuously at 50 mL an hour to provide 100% of estimated nutrition needs.  Patient educated to increase jejunostomy feedings 10 mL daily to goal rate of 100 mL an hour for 12 hours daily.  He verbalizes understanding.  Teach back method used.  Monitoring, evaluation, goals: Patient is tolerating jejunostomy feedings at goal rate of 5 cans daily to meet 100% of estimated nutrition needs.  Next visit: Wednesday, November 26, during chemotherapy.

## 2013-07-06 NOTE — Patient Instructions (Signed)
[facility] Discharge Instructions for Patients Receiving Chemotherapy  Today you received the following chemotherapy agents:  Carboplatin  To help prevent nausea and vomiting after your treatment, we encourage you to take your nausea medication as ordered per MD.   If you develop nausea and vomiting that is not controlled by your nausea medication, call the clinic.   BELOW ARE SYMPTOMS THAT SHOULD BE REPORTED IMMEDIATELY:  *FEVER GREATER THAN 100.5 F  *CHILLS WITH OR WITHOUT FEVER  NAUSEA AND VOMITING THAT IS NOT CONTROLLED WITH YOUR NAUSEA MEDICATION  *UNUSUAL SHORTNESS OF BREATH  *UNUSUAL BRUISING OR BLEEDING  TENDERNESS IN MOUTH AND THROAT WITH OR WITHOUT PRESENCE OF ULCERS  *URINARY PROBLEMS  *BOWEL PROBLEMS  UNUSUAL RASH Items with * indicate a potential emergency and should be followed up as soon as possible.  Feel free to call the clinic you have any questions or concerns. The clinic phone number is [phone].   For constipation, please take Miralax once a day per Lonna Cobb NP.  Use as directed.

## 2013-07-06 NOTE — Progress Notes (Signed)
1225-  Pt's face became flushed, felt SOB, dizzy, and hot.  Taxol stopped.  Fluids running wide open.  Vitals stable, HR elevated at 121.    1229--Lisa Thomas, NP at bedside to assess patient.  Pt symptoms have completely subsided. No orders given for any additional prn medications to be given.  VSS  1234--Dr Sherrill at bedside, he wants to hold taxol today, will only give carboplatin.  Next week he will be premedicated at home and will resume taxol/ carboplatin next week. Will continue to monitor closely.  SLJ 

## 2013-07-06 NOTE — Progress Notes (Deleted)
1225-  Pt's face became flushed, felt SOB, dizzy, and hot.  Taxol stopped.  Fluids running wide open.  Vitals stable, HR elevated at 121.    1229--Lisa Maisie Fus, NP at bedside to assess patient.  Pt symptoms have completely subsided. No orders given for any additional prn medications to be given.  VSS  1234--Dr Sherrill at bedside, he wants to hold taxol today, will only give carboplatin.  Next week he will be premedicated at home and will resume taxol/ carboplatin next week. Will continue to monitor closely.  SLJ

## 2013-07-06 NOTE — Telephone Encounter (Signed)
Talked to pt and he has appt calendar for November and December 2014

## 2013-07-06 NOTE — Progress Notes (Signed)
OFFICE PROGRESS NOTE  Interval history:  Henry [facility] returns for followup of esophagus cancer. He began radiation and concurrent weekly Taxol/carboplatin on 06/22/2013. He was hospitalized on 06/27/2013 with dehydration, solid/liquid dysphagia. He underwent placement of a surgical J-tube on 06/28/2013. He was discharged home on 07/03/2013.  He is seen today in the chemotherapy infusion room while receiving the second treatment with weekly Taxol/carboplatin. He reports tolerating week one well. He denies nausea/vomiting. No mouth sores. No diarrhea. No numbness or tingling in his hands or feet. He is receiving tube feeds. He takes medications by mouth. He reports tolerating fluids without difficulty by mouth. He has not had a bowel movement since discharge from the hospital. He occasionally has mid chest discomfort.   Objective: There were no vitals taken for this visit.  Lungs are clear. Regular cardiac rhythm. Port-A-Cath site without erythema. Abdomen soft. Nontender. No hepatomegaly. G-tube site with mild erythema. Midline wound with staples intact. No surrounding erythema or drainage. No leg edema.  Lab Results: Lab Results  Component Value Date   WBC 11.5* 07/06/2013   HGB 8.5* 07/06/2013   HCT 27.0* 07/06/2013   MCV 84.1 07/06/2013   PLT 618* 07/06/2013    Chemistry:    Chemistry      Component Value Date/Time   NA 130* 07/01/2013 0600   NA 140 06/23/2013 1125   K 3.6 07/01/2013 0600   K 4.1 06/23/2013 1125   CL 95* 07/01/2013 0600   CO2 28 07/01/2013 0600   CO2 20* 06/23/2013 1125   BUN 6 07/01/2013 0600   BUN 26.4* 06/23/2013 1125   CREATININE 0.31* 07/01/2013 0600   CREATININE 0.7 06/23/2013 1125      Component Value Date/Time   CALCIUM 8.6 07/01/2013 0600   CALCIUM 10.6* 06/23/2013 1125   ALKPHOS 64 06/23/2013 1125   ALKPHOS 58 12/04/2011 1505   AST 17 06/23/2013 1125   AST 14 12/04/2011 1505   ALT 8 06/23/2013 1125   ALT 8 12/04/2011 1505   BILITOT 0.36 06/23/2013 1125    BILITOT 0.2* 12/04/2011 1505       Studies/Results: Ir Fluoro Guide Cv Line Left  06/21/2013   INDICATION: History esophageal carcinoma, in need of intravenous access for chemotherapy administration  EXAM: IMPLANTED PORT A CATH PLACEMENT WITH ULTRASOUND AND FLUOROSCOPIC GUIDANCE  MEDICATIONS: Ancef 2 gm IV; IV antibiotic was given in an appropriate time interval prior to skin puncture.  ANESTHESIA/SEDATION: Versed 2 mg IV; Fentanyl 100 mcg IV;  Total Moderate Sedation Time  35  minutes.  CONTRAST:  None  COMPARISON:  None.  FLUOROSCOPY TIME:  36 seconds.  PROCEDURE: The procedure, risks, benefits, and alternatives were explained to the patient. Questions regarding the procedure were encouraged and answered. The patient understands and consents to the procedure.  The right neck and chest were prepped with chlorhexidine in a sterile fashion, and a sterile drape was applied covering the operative field. Maximum barrier sterile technique with sterile gowns and gloves were used for the procedure. A timeout was performed prior to the initiation of the procedure. Local anesthesia was provided with 1% lidocaine with epinephrine.  After creating a small venotomy incision, a micropuncture kit was utilized to access the internal jugular vein under direct, real-time ultrasound guidance. Ultrasound image documentation was performed. The microwire was kinked to measure appropriate catheter length.  A subcutaneous port pocket was then created along the upper chest wall utilizing a combination of sharp and blunt dissection. The pocket was irrigated with sterile saline.  A single lumen ISP power injectable port was chosen for placement. The 8 Fr catheter was tunneled from the port pocket site to the venotomy incision. The port was placed in the pocket. The external catheter was trimmed to appropriate length. At the venotomy, an 8 Fr peel-away sheath was placed over a guidewire under fluoroscopic guidance. The catheter was then  placed through the sheath and the sheath was removed. Final catheter positioning was confirmed and documented with a fluoroscopic spot radiograph. The port was accessed with a Huber needle, aspirated and flushed with heparinized saline.  The venotomy site was closed with an interrupted 4-0 Vicryl suture. The port pocket incision was closed with interrupted 2-0 Vicryl suture and the skin was opposed with a running subcuticular 4-0 Vicryl suture. Dermabond and Steri-strips were applied to both incisions. Dressings were placed. The patient tolerated the procedure well without immediate post procedural complication.  COMPLICATIONS: None immediate  FINDINGS: After catheter placement, the tip lies within the superior cavoatrial junction. The catheter aspirates and flushes normally and is ready for immediate use.  IMPRESSION: Successful placement of a right internal jugular approach power injectable Port-A-Cath. The catheter is ready for immediate use.   Electronically Signed   By: Simonne Come M.D.   On: 06/21/2013 15:00   Ir US Guide Vasc Access Right  06/21/2013   INDICATION: History esophageal carcinoma, in need of intravenous access for chemotherapy administration  EXAM: IMPLANTED PORT A CATH PLACEMENT WITH ULTRASOUND AND FLUOROSCOPIC GUIDANCE  MEDICATIONS: Ancef 2 gm IV; IV antibiotic was given in an appropriate time interval prior to skin puncture.  ANESTHESIA/SEDATION: Versed 2 mg IV; Fentanyl 100 mcg IV;  Total Moderate Sedation Time  35  minutes.  CONTRAST:  None  COMPARISON:  None.  FLUOROSCOPY TIME:  36 seconds.  PROCEDURE: The procedure, risks, benefits, and alternatives were explained to the patient. Questions regarding the procedure were encouraged and answered. The patient understands and consents to the procedure.  The right neck and chest were prepped with chlorhexidine in a sterile fashion, and a sterile drape was applied covering the operative field. Maximum barrier sterile technique with sterile  gowns and gloves were used for the procedure. A timeout was performed prior to the initiation of the procedure. Local anesthesia was provided with 1% lidocaine with epinephrine.  After creating a small venotomy incision, a micropuncture kit was utilized to access the internal jugular vein under direct, real-time ultrasound guidance. Ultrasound image documentation was performed. The microwire was kinked to measure appropriate catheter length.  A subcutaneous port pocket was then created along the upper chest wall utilizing a combination of sharp and blunt dissection. The pocket was irrigated with sterile saline. A single lumen ISP power injectable port was chosen for placement. The 8 Fr catheter was tunneled from the port pocket site to the venotomy incision. The port was placed in the pocket. The external catheter was trimmed to appropriate length. At the venotomy, an 8 Fr peel-away sheath was placed over a guidewire under fluoroscopic guidance. The catheter was then placed through the sheath and the sheath was removed. Final catheter positioning was confirmed and documented with a fluoroscopic spot radiograph. The port was accessed with a Huber needle, aspirated and flushed with heparinized saline.  The venotomy site was closed with an interrupted 4-0 Vicryl suture. The port pocket incision was closed with interrupted 2-0 Vicryl suture and the skin was opposed with a running subcuticular 4-0 Vicryl suture. Dermabond and Steri-strips were applied to both incisions. Dressings  were placed. The patient tolerated the procedure well without immediate post procedural complication.  COMPLICATIONS: None immediate  FINDINGS: After catheter placement, the tip lies within the superior cavoatrial junction. The catheter aspirates and flushes normally and is ready for immediate use.  IMPRESSION: Successful placement of a right internal jugular approach power injectable Port-A-Cath. The catheter is ready for immediate use.    Electronically Signed   By: Simonne Come M.D.   On: 06/21/2013 15:00   Ct Outside Films Soft Tissue Neck  06/17/2013   This examination belongs to an outside facility and is stored  here for comparison purposes only.  Contact the originating outside  institution for any associated report or interpretation.   Medications: I have reviewed the patient's current medications.  Assessment/Plan:  1. 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  Initiation of concurrent radiation and weekly Taxol/carboplatin on 06/22/2013. 2. Spiculated left lower lobe nodule with low level FDG activity on the staging PET scan 05/26/2013  3. Tongue cancer 2013,ypT2N2cM0, status post Erlotonib, glossectomy/neck dissection, and adjuvant cisplatin/radiation in 2013  4. Ongoing tobacco use  5. History of alcohol use  6. Solid/liquid dysphagia secondary to #1.  7. Status post placement of a surgical J-tube 06/28/2013. 8. Hospitalization 06/27/2013 through 07/03/2013 with dehydration, solid/liquid dysphagia. A J-tube was placed by surgery. 9. Nutrition via J-tube.  Disposition-he continues radiation. He is completing the second weekly treatment with Taxol/carboplatin today. He will return for the third treatment on 07/13/2013. We will see him in followup on 07/18/2013. He will contact the office in the interim with any problems.   Henry [facility] ANP/GNP-BC

## 2013-07-06 NOTE — Telephone Encounter (Signed)
Per chemo RN and POF I have scheduled appts 

## 2013-07-07 ENCOUNTER — Ambulatory Visit
Admission: RE | Admit: 2013-07-07 | Discharge: 2013-07-07 | Disposition: A | Discharge status: Home / Self Care | Payer: Medicaid Other | Source: Ambulatory Visit | Attending: Radiation Oncology | Admitting: Radiation Oncology

## 2013-07-08 ENCOUNTER — Encounter: Payer: Self-pay | Admitting: Radiation Oncology

## 2013-07-08 ENCOUNTER — Ambulatory Visit
Admission: RE | Admit: 2013-07-08 | Discharge: 2013-07-08 | Disposition: A | Discharge status: Home / Self Care | Payer: Medicaid Other | Source: Ambulatory Visit | Attending: Radiation Oncology | Admitting: Radiation Oncology

## 2013-07-08 VITALS — BP 99/66 | HR 126 | Temp 97.4°F | Resp 20 | Wt 111.1 lb

## 2013-07-08 DIAGNOSIS — C155 Malignant neoplasm of lower third of esophagus: Secondary | ICD-10-CM

## 2013-07-08 NOTE — Progress Notes (Signed)
Department of Radiation Oncology  Phone:  [phone] Fax:        [phone]  Weekly Treatment Note    Name: Henry Leonard Date: 07/08/2013 MRN: [id number] DOB: [birth year]   Current dose: 14.4 Gy  Current fraction: 8   MEDICATIONS: Current Outpatient Prescriptions  Medication Sig Dispense Refill  . antiseptic oral rinse (BIOTENE) LIQD 15 mLs by Mouth Rinse route 2 times daily at 12 noon and 4 pm.  1 Bottle  3  . dexamethasone (DECADRON) 2 MG tablet Take 5 tablets (10 mg total) by mouth at bedtime the night before chemo. Take 5 tablets at 6 AM the morning of chemo.  30 tablet  0  . HYDROcodone-acetaminophen (HYCET) 7.5-325 mg/15 ml solution Take 10-15 mLs by mouth every 4 (four) hours as needed (pain).  120 mL  0  . lidocaine-prilocaine (EMLA) cream Apply to port a cath site one hour prior to use. Do not rub in. Cover with plastic.  30 g  2  . magnesium hydroxide (MILK OF MAGNESIA) 400 MG/5ML suspension Place 30 mLs into feeding tube daily as needed for mild constipation or moderate constipation (okay to use J tube for this medication).  360 mL  0  . Naphazoline HCl (CLEAR EYES OP) Place 2 drops into both eyes daily as needed (For dry or irritated eyes.).      Marland Kitchen nicotine (NICODERM CQ - DOSED IN MG/24 HOURS) 14 mg/24hr patch Place 1 patch (14 mg total) onto the skin daily.  28 patch  0  . Nutritional Supplements (FEEDING SUPPLEMENT, OSMOLITE 1.5 CAL,) LIQD Place 1,000 mLs into feeding tube continuous.  1000 mL  5  . Nutritional Supplements (OSMOLITE) LIQD Take 237 mLs by mouth See admin instructions. He drinks 3-4 cans per day.  10 Can  5  . ondansetron (ZOFRAN) 4 MG tablet Place 1 tablet (4 mg total) into feeding tube every 6 (six) hours as needed for nausea or vomiting.  20 tablet  0  . polyethylene glycol (MIRALAX / GLYCOLAX) packet Take 17 g by mouth daily as needed for moderate constipation (via peg).      . Water For Irrigation, Sterile (FREE WATER) SOLN Place 150 mLs into  feeding tube every 4 (four) hours.  1000 mL  5  . acetaminophen (TYLENOL) 160 MG/5ML solution Take 20.3 mLs (650 mg total) by mouth every 4 (four) hours as needed for mild pain.  120 mL  0  . chlorhexidine (PERIDEX) 0.12 % solution 15 mLs by Mouth Rinse route 2 (two) times daily.  120 mL  0  . Morphine Sulfate (MORPHINE CONCENTRATE) 10 mg / 0.5 ml concentrated solution Place 0.5 mLs (10 mg total) into feeding tube every 2 (two) hours as needed for severe pain.  30 mL  0  . oxyCODONE-acetaminophen (PERCOCET/ROXICET) 5-325 MG per tablet Place 1-2 tablets into feeding tube every 4 (four) hours as needed for moderate pain.  30 tablet  0  . promethazine (PHENERGAN) 6.25 MG/5ML syrup Place 10 mLs (12.5 mg total) into feeding tube every 4 (four) hours as needed for nausea (Call if nausea not controlled.).  120 mL  0   No current facility-administered medications for this encounter.     ALLERGIES: Penicillins   LABORATORY DATA:  Lab Results  Component Value Date   WBC 11.5* 07/06/2013   HGB 8.5* 07/06/2013   HCT 27.0* 07/06/2013   MCV 84.1 07/06/2013   PLT 618* 07/06/2013   Lab Results  Component Value Date  NA 130* 07/06/2013   K 4.7 07/06/2013   CL 95* 07/01/2013   CO2 25 07/06/2013   Lab Results  Component Value Date   ALT 10 07/06/2013   AST 13 07/06/2013   ALKPHOS 60 07/06/2013   BILITOT <0.20 07/06/2013     NARRATIVE: Henry [facility] was seen today for weekly treatment management. The chart was checked and the patient's films were reviewed. The patient states that he is doing well. He is swallowing just a little bit of liquid, essentially all of his nutrition currently is through the feeding tube. The patient has been able to resume his treatment without further substantial to light.  PHYSICAL EXAMINATION: weight is 111 lb 1.6 oz (50.395 kg). His oral temperature is 97.4 F (36.3 C). His blood pressure is 99/66 and his pulse is 126. His respiration is 20.        ASSESSMENT:  The patient is doing satisfactorily with treatment.  PLAN: We will continue with the patient's radiation treatment as planned.

## 2013-07-08 NOTE — Progress Notes (Signed)
Weekly rad txs, esophagus 8 completed, sips water, takes hydrocodone sytup prn pain, osmoilte via peg 81ml/hr now  tolerateing okay, dry mouth, , energy level  A little better, eating bettervia peg, mild pain in mouth and where surgery was at peg insertion, d/c hospital last Sunday, will pick up miralax today stated, passing gas no bm over 1 week 11:36 AM

## 2013-07-10 ENCOUNTER — Other Ambulatory Visit: Payer: Self-pay | Admitting: Oncology

## 2013-07-11 ENCOUNTER — Ambulatory Visit
Admission: RE | Admit: 2013-07-11 | Discharge: 2013-07-11 | Disposition: A | Discharge status: Home / Self Care | Payer: Medicaid Other | Source: Ambulatory Visit | Attending: Radiation Oncology | Admitting: Radiation Oncology

## 2013-07-11 ENCOUNTER — Telehealth (INDEPENDENT_AMBULATORY_CARE_PROVIDER_SITE_OTHER): Payer: Self-pay | Admitting: General Surgery

## 2013-07-11 MED ORDER — HYDROCODONE-ACETAMINOPHEN 7.5-325 MG/15ML PO SOLN: 10.0000 mL | ORAL | Status: DC | PRN

## 2013-07-11 NOTE — Telephone Encounter (Signed)
Appt made with patient.  

## 2013-07-11 NOTE — Telephone Encounter (Signed)
Message copied by Liliana Cline on Mon Jul 11, 2013  3:14 PM ------      Message from: Larry Sierras      Created: Mon Jul 11, 2013  3:07 PM      Regarding: APPT      Contact: [phone]       PT NEEDS STAPLE REM'L APPT?/D/C FROM [facility] 07-03-13? CALL PT/GM ------

## 2013-07-12 ENCOUNTER — Ambulatory Visit
Admission: RE | Admit: 2013-07-12 | Discharge: 2013-07-12 | Disposition: A | Discharge status: Home / Self Care | Payer: Medicaid Other | Source: Ambulatory Visit | Attending: Radiation Oncology | Admitting: Radiation Oncology

## 2013-07-13 ENCOUNTER — Encounter (INDEPENDENT_AMBULATORY_CARE_PROVIDER_SITE_OTHER): Payer: Self-pay | Admitting: General Surgery

## 2013-07-13 ENCOUNTER — Encounter (INDEPENDENT_AMBULATORY_CARE_PROVIDER_SITE_OTHER): Payer: Self-pay

## 2013-07-13 ENCOUNTER — Ambulatory Visit
Admission: RE | Admit: 2013-07-13 | Discharge: 2013-07-13 | Disposition: A | Discharge status: Home / Self Care | Payer: Medicaid Other | Source: Ambulatory Visit | Attending: Radiation Oncology | Admitting: Radiation Oncology

## 2013-07-13 ENCOUNTER — Other Ambulatory Visit (HOSPITAL_BASED_OUTPATIENT_CLINIC_OR_DEPARTMENT_OTHER): Payer: Medicaid Other | Admitting: Lab

## 2013-07-13 ENCOUNTER — Ambulatory Visit (INDEPENDENT_AMBULATORY_CARE_PROVIDER_SITE_OTHER): Payer: Medicaid Other | Admitting: General Surgery

## 2013-07-13 ENCOUNTER — Ambulatory Visit (HOSPITAL_BASED_OUTPATIENT_CLINIC_OR_DEPARTMENT_OTHER): Payer: Medicaid Other

## 2013-07-13 ENCOUNTER — Ambulatory Visit: Payer: Medicaid Other | Admitting: Nutrition

## 2013-07-13 VITALS — BP 102/76 | HR 78 | Resp 16 | Ht 66.0 in | Wt 109.0 lb

## 2013-07-13 VITALS — BP 124/90 | Temp 98.1°F | Resp 18

## 2013-07-13 DIAGNOSIS — C155 Malignant neoplasm of lower third of esophagus: Secondary | ICD-10-CM

## 2013-07-13 DIAGNOSIS — C029 Malignant neoplasm of tongue, unspecified: Secondary | ICD-10-CM

## 2013-07-13 DIAGNOSIS — Z09 Encounter for follow-up examination after completed treatment for conditions other than malignant neoplasm: Secondary | ICD-10-CM

## 2013-07-13 DIAGNOSIS — Z5111 Encounter for antineoplastic chemotherapy: Secondary | ICD-10-CM

## 2013-07-13 LAB — CBC WITH DIFFERENTIAL/PLATELET
BASO%: 0.1 % (ref 0.0–2.0)
EOS%: 0 % (ref 0.0–7.0)
Eosinophils Absolute: 0 10*3/uL (ref 0.0–0.5)
HGB: 9.2 g/dL — ABNORMAL LOW (ref 13.0–17.1)
LYMPH%: 1.3 % — ABNORMAL LOW (ref 14.0–49.0)
MCH: 26.8 pg — ABNORMAL LOW (ref 27.2–33.4)
MCHC: 31.8 g/dL — ABNORMAL LOW (ref 32.0–36.0)
MCV: 84.3 fL (ref 79.3–98.0)
MONO%: 1.1 % (ref 0.0–14.0)
RBC: 3.43 10*6/uL — ABNORMAL LOW (ref 4.20–5.82)
RDW: 14.2 % (ref 11.0–14.6)
lymph#: 0.1 10*3/uL — ABNORMAL LOW (ref 0.9–3.3)

## 2013-07-13 MED ORDER — FAMOTIDINE IN NACL 20-0.9 MG/50ML-% IV SOLN
20.0000 mg | Freq: Once | INTRAVENOUS | Status: AC
  Administered 2013-07-13: 20 mg via INTRAVENOUS

## 2013-07-13 MED ORDER — SODIUM CHLORIDE 0.9 % IV SOLN
236.0000 mg | Freq: Once | INTRAVENOUS | Status: AC
  Administered 2013-07-13: 240 mg via INTRAVENOUS
  Filled 2013-07-13: qty 24

## 2013-07-13 MED ORDER — METHYLPREDNISOLONE SODIUM SUCC 125 MG IJ SOLR
INTRAMUSCULAR | Status: AC
  Filled 2013-07-13: qty 2

## 2013-07-13 MED ORDER — ONDANSETRON 16 MG/50ML IVPB (CHCC)
INTRAVENOUS | Status: AC
  Filled 2013-07-13: qty 16

## 2013-07-13 MED ORDER — HEPARIN SOD (PORK) LOCK FLUSH 100 UNIT/ML IV SOLN
500.0000 [IU] | Freq: Once | INTRAVENOUS | Status: AC | PRN
  Administered 2013-07-13: 500 [IU]
  Filled 2013-07-13: qty 5

## 2013-07-13 MED ORDER — METHYLPREDNISOLONE SODIUM SUCC 125 MG IJ SOLR
125.0000 mg | Freq: Once | INTRAMUSCULAR | Status: AC
  Administered 2013-07-13: 125 mg via INTRAVENOUS

## 2013-07-13 MED ORDER — ONDANSETRON 16 MG/50ML IVPB (CHCC)
16.0000 mg | Freq: Once | INTRAVENOUS | Status: AC
  Administered 2013-07-13: 16 mg via INTRAVENOUS

## 2013-07-13 MED ORDER — SODIUM CHLORIDE 0.9 % IV SOLN
Freq: Once | INTRAVENOUS | Status: AC
  Administered 2013-07-13: 12:00:00 via INTRAVENOUS

## 2013-07-13 MED ORDER — PACLITAXEL CHEMO INJECTION 300 MG/50ML
50.0000 mg/m2 | Freq: Once | INTRAVENOUS | Status: AC
  Administered 2013-07-13: 78 mg via INTRAVENOUS
  Filled 2013-07-13: qty 13

## 2013-07-13 MED ORDER — SODIUM CHLORIDE 0.9 % IJ SOLN
10.0000 mL | INTRAMUSCULAR | Status: DC | PRN
Start: 2013-07-13 — End: 2013-07-13
  Administered 2013-07-13: 10 mL
  Filled 2013-07-13: qty 10

## 2013-07-13 MED ORDER — FAMOTIDINE IN NACL 20-0.9 MG/50ML-% IV SOLN
INTRAVENOUS | Status: AC
  Filled 2013-07-13: qty 50

## 2013-07-13 MED ORDER — DIPHENHYDRAMINE HCL 50 MG/ML IJ SOLN
25.0000 mg | Freq: Once | INTRAMUSCULAR | Status: AC
  Administered 2013-07-13: 25 mg via INTRAVENOUS

## 2013-07-13 MED ORDER — DIPHENHYDRAMINE HCL 50 MG/ML IJ SOLN
INTRAMUSCULAR | Status: AC
  Filled 2013-07-13: qty 1

## 2013-07-13 NOTE — Progress Notes (Signed)
Nutrition followup completed in chemotherapy.  Patient tolerating Osmolite 1.5 via jejunostomy tube at 80 mL an hour continuously.  He is working up to goal rate of 100 mL an hour for 12 hours daily to provide approximately 1800 calories, 75 g protein, and 1814 mL free water.  Weight is stable and was documented as 109 pounds on November 26 from 108.9 pounds November 14.  Estimated nutrition needs: 1600-1800 calories, 75-85 g protein, 1.8 L fluid.  Nutrition diagnosis: Unintended weight loss improved.  Intervention: Patient to continue Osmolite 1.5 via jejunostomy tube to goal rate of 100 mL an hour for 12 hours daily to provide 100% of estimated nutrition needs.  Teach back method used.  Monitoring, evaluation, goals: Patient is tolerating jejunostomy feedings working towards goal rate.  Weight is stable.  Next visit: Wednesday, December 3, during chemotherapy.

## 2013-07-13 NOTE — Progress Notes (Signed)
Subjective:     Patient ID: Henry [facility], male   DOB: December 10, 1955, 57 y.o.   MRN: [id number]  HPI 57 year old Caucasian male comes in for followup after undergoing open placement of a feeding jejunostomy tube on November 11. He was discharged from the medical service several days later. The patient had been recently diagnosed with esophageal cancer and was undergoing neoadjuvant therapy when he was found to be dehydrated and malnourished and admitted to the [facility] for IV fluid resuscitation. Because he had by mouth intolerance, our service was consulted to place a feeding jejunostomy. I contacted Henry [facility] and his primary surgeon was out of town and we were asked to proceed with J-tube placement. Overall the patient states he is doing well. He is getting cyclic tube feeds at night at around 50 cc per hour. He denies any abdominal pain. He reports bowel movements. He denies any emesis. He reports that his strength is improving. He states that he is having no trouble hooking up the feeding tube.  Review of Systems     Objective:   Physical Exam BP 102/76  Pulse 78  Resp 16  Ht 5\' 6"  (1.676 m)  Wt 109 lb (49.442 kg)  BMI 17.60 kg/m2 Alert, cachetic, nad cta Reg Soft, nt, nd. Well healed mini-midline incision, J tube intact and secure - no skin irritation. No hernia    Assessment:     Status post open jejunostomy tube placement Esophageal cancer History of tongue cancer     Plan:     Overall he appears to be healing well from his feeding tube placement. We discussed importance of tube maintenance as well as only putting liquids down the feeding tube. He is to followup as directed with the cancer center at [facility]. Followup when necessary with me  Henry [facility]. Andrey Campanile, MD, FACS General, Bariatric, & Minimally Invasive Surgery [facility], [place]

## 2013-07-13 NOTE — Patient Instructions (Signed)
[facility] Discharge Instructions for Patients Receiving Chemotherapy  Today you received the following chemotherapy agents: Taxol and Carboplatin.  To help prevent nausea and vomiting after your treatment, we encourage you to take your nausea medication as prescribed.   If you develop nausea and vomiting that is not controlled by your nausea medication, call the clinic.   BELOW ARE SYMPTOMS THAT SHOULD BE REPORTED IMMEDIATELY:  *FEVER GREATER THAN 100.5 F  *CHILLS WITH OR WITHOUT FEVER  NAUSEA AND VOMITING THAT IS NOT CONTROLLED WITH YOUR NAUSEA MEDICATION  *UNUSUAL SHORTNESS OF BREATH  *UNUSUAL BRUISING OR BLEEDING  TENDERNESS IN MOUTH AND THROAT WITH OR WITHOUT PRESENCE OF ULCERS  *URINARY PROBLEMS  *BOWEL PROBLEMS  UNUSUAL RASH Items with * indicate a potential emergency and should be followed up as soon as possible.  Feel free to call the clinic you have any questions or concerns. The clinic phone number is [phone].    

## 2013-07-16 ENCOUNTER — Other Ambulatory Visit: Payer: Self-pay | Admitting: Oncology

## 2013-07-18 ENCOUNTER — Ambulatory Visit
Admission: RE | Admit: 2013-07-18 | Discharge: 2013-07-18 | Disposition: A | Discharge status: Home / Self Care | Payer: Medicaid Other | Source: Ambulatory Visit | Attending: Radiation Oncology | Admitting: Radiation Oncology

## 2013-07-18 ENCOUNTER — Telehealth: Payer: Self-pay | Admitting: Oncology

## 2013-07-18 ENCOUNTER — Other Ambulatory Visit (HOSPITAL_BASED_OUTPATIENT_CLINIC_OR_DEPARTMENT_OTHER): Payer: Medicaid Other | Admitting: Lab

## 2013-07-18 ENCOUNTER — Ambulatory Visit (HOSPITAL_BASED_OUTPATIENT_CLINIC_OR_DEPARTMENT_OTHER): Payer: Medicaid Other | Admitting: Oncology

## 2013-07-18 VITALS — BP 109/77 | HR 132 | Temp 97.1°F | Resp 20 | Ht 66.0 in | Wt 108.2 lb

## 2013-07-18 DIAGNOSIS — R1310 Dysphagia, unspecified: Secondary | ICD-10-CM

## 2013-07-18 DIAGNOSIS — C155 Malignant neoplasm of lower third of esophagus: Secondary | ICD-10-CM

## 2013-07-18 DIAGNOSIS — F17200 Nicotine dependence, unspecified, uncomplicated: Secondary | ICD-10-CM

## 2013-07-18 DIAGNOSIS — R911 Solitary pulmonary nodule: Secondary | ICD-10-CM

## 2013-07-18 DIAGNOSIS — C029 Malignant neoplasm of tongue, unspecified: Secondary | ICD-10-CM

## 2013-07-18 LAB — CBC WITH DIFFERENTIAL/PLATELET
BASO%: 0.2 % (ref 0.0–2.0)
EOS%: 2 % (ref 0.0–7.0)
HCT: 27.1 % — ABNORMAL LOW (ref 38.4–49.9)
MCH: 26.6 pg — ABNORMAL LOW (ref 27.2–33.4)
MCHC: 31 g/dL — ABNORMAL LOW (ref 32.0–36.0)
MONO#: 0.2 10*3/uL (ref 0.1–0.9)
MONO%: 2.1 % (ref 0.0–14.0)
NEUT%: 90 % — ABNORMAL HIGH (ref 39.0–75.0)
RBC: 3.16 10*6/uL — ABNORMAL LOW (ref 4.20–5.82)
WBC: 10.3 10*3/uL (ref 4.0–10.3)
lymph#: 0.6 10*3/uL — ABNORMAL LOW (ref 0.9–3.3)
nRBC: 0 % (ref 0–0)

## 2013-07-18 NOTE — Progress Notes (Signed)
   [facility]    OFFICE PROGRESS NOTE   INTERVAL HISTORY:   He completed a third cycle of carboplatin and Taxol on 07/13/2013. He took Decadron home premedication and received Solu-Medrol prior to the Taxol. He reports no symptom of an allergic reaction during the chemotherapy infusion. He later had "vertigo "type symptoms. He continues to have dysphagia. He is using the jejunostomy tube for feedings.  Objective:  Vital signs in last 24 hours:  Blood pressure 109/77, pulse 132, temperature 97.1 F (36.2 C), temperature source Oral, resp. rate 20, height 5\' 6"  (1.676 m), weight 108 lb 3.2 oz (49.079 kg). repeat manual pulse-120    HEENT: No thrush or ulcers, the mucous membranes are dry Resp: Lungs clear bilaterally Cardio: Regular rate and rhythm GI: No hepatomegaly, healing midline surgical incision with Steri-Strips in place. Left upper quadrant feeding tube site without evidence of infection Vascular: No leg edema   Portacath/PICC-without erythema  Lab Results:  Lab Results  Component Value Date   WBC 10.3 07/18/2013   HGB 8.4* 07/18/2013   HCT 27.1* 07/18/2013   MCV 85.8 07/18/2013   PLT 487* 07/18/2013      Medications: I have reviewed the patient's current medications.  Assessment/Plan: 1. 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  Initiation of concurrent radiation and weekly Taxol/carboplatin on 06/22/2013. Week 2 on 07/06/2013, allergic reaction early in the Taxol infusion, he received carboplatin on only Week 3 on 07/13/2013, no allergic reaction after Decadron/Solu-Medrol premedication 2. Spiculated left lower lobe nodule with low level FDG activity on the staging PET scan 05/26/2013  3. Tongue cancer 2013,ypT2N2cM0, status post  Erlotonib, glossectomy/neck dissection, and adjuvant cisplatin/radiation in 2013  4. Ongoing tobacco use  5. History of alcohol use  6. Solid/liquid dysphagia secondary to #1.  7. Status post placement of a surgical J-tube 06/28/2013.  8. Hospitalization 06/27/2013 through 07/03/2013 with dehydration, solid/liquid dysphagia. A J-tube was placed by surgery.  9. Nutrition via J-tube.  10. Allergic reaction to Taxol on 07/06/2013-no reaction with treatment 07/13/2013 after home Decadron and pre-Taxol Solu-Medrol   Disposition:  He will complete a fourth week of Taxol/carboplatin on 07/20/2013. He is scheduled for the final planned cycle of chemotherapy on 07/27/2013. He will return for an office visit on 08/04/2013.  Thornton Papas, MD  07/18/2013  5:53 PM

## 2013-07-18 NOTE — Telephone Encounter (Signed)
appts made per 12/1 POF AVS and CAL given to pt shh

## 2013-07-19 ENCOUNTER — Ambulatory Visit
Admission: RE | Admit: 2013-07-19 | Discharge: 2013-07-19 | Disposition: A | Discharge status: Home / Self Care | Payer: Medicaid Other | Source: Ambulatory Visit | Attending: Radiation Oncology | Admitting: Radiation Oncology

## 2013-07-20 ENCOUNTER — Ambulatory Visit (HOSPITAL_BASED_OUTPATIENT_CLINIC_OR_DEPARTMENT_OTHER): Payer: Medicaid Other

## 2013-07-20 ENCOUNTER — Ambulatory Visit: Payer: Medicaid Other | Admitting: Nutrition

## 2013-07-20 ENCOUNTER — Ambulatory Visit
Admission: RE | Admit: 2013-07-20 | Discharge: 2013-07-20 | Disposition: A | Discharge status: Home / Self Care | Payer: Medicaid Other | Source: Ambulatory Visit | Attending: Radiation Oncology | Admitting: Radiation Oncology

## 2013-07-20 VITALS — BP 130/87 | HR 126 | Temp 96.9°F | Resp 16

## 2013-07-20 DIAGNOSIS — C029 Malignant neoplasm of tongue, unspecified: Secondary | ICD-10-CM

## 2013-07-20 DIAGNOSIS — C155 Malignant neoplasm of lower third of esophagus: Secondary | ICD-10-CM

## 2013-07-20 DIAGNOSIS — Z5111 Encounter for antineoplastic chemotherapy: Secondary | ICD-10-CM

## 2013-07-20 MED ORDER — ONDANSETRON 16 MG/50ML IVPB (CHCC)
INTRAVENOUS | Status: AC
  Filled 2013-07-20: qty 16

## 2013-07-20 MED ORDER — DIPHENHYDRAMINE HCL 50 MG/ML IJ SOLN
INTRAMUSCULAR | Status: AC
  Filled 2013-07-20: qty 1

## 2013-07-20 MED ORDER — ALUM & MAG HYDROXIDE-SIMETH 200-200-20 MG/5ML PO SUSP
30.0000 mL | Freq: Once | ORAL | Status: AC
  Administered 2013-07-20: 30 mL via ORAL
  Filled 2013-07-20: qty 30

## 2013-07-20 MED ORDER — SODIUM CHLORIDE 0.9 % IJ SOLN
10.0000 mL | INTRAMUSCULAR | Status: DC | PRN
  Administered 2013-07-20: 10 mL
  Filled 2013-07-20: qty 10

## 2013-07-20 MED ORDER — HYDROCODONE-ACETAMINOPHEN 7.5-325 MG/15ML PO SOLN: 10.0000 mL | ORAL | Status: DC | PRN

## 2013-07-20 MED ORDER — ONDANSETRON 16 MG/50ML IVPB (CHCC)
16.0000 mg | Freq: Once | INTRAVENOUS | Status: AC
  Administered 2013-07-20: 16 mg via INTRAVENOUS

## 2013-07-20 MED ORDER — FAMOTIDINE IN NACL 20-0.9 MG/50ML-% IV SOLN
INTRAVENOUS | Status: AC
  Filled 2013-07-20: qty 50

## 2013-07-20 MED ORDER — SODIUM CHLORIDE 0.9 % IV SOLN
236.0000 mg | Freq: Once | INTRAVENOUS | Status: AC
  Administered 2013-07-20: 240 mg via INTRAVENOUS
  Filled 2013-07-20: qty 24

## 2013-07-20 MED ORDER — FAMOTIDINE 40 MG/5ML PO SUSR: 40.0000 mg | Freq: Two times a day (BID) | ORAL | Status: DC

## 2013-07-20 MED ORDER — FAMOTIDINE IN NACL 20-0.9 MG/50ML-% IV SOLN
20.0000 mg | Freq: Once | INTRAVENOUS | Status: AC
  Administered 2013-07-20: 20 mg via INTRAVENOUS

## 2013-07-20 MED ORDER — DIPHENHYDRAMINE HCL 50 MG/ML IJ SOLN
25.0000 mg | Freq: Once | INTRAMUSCULAR | Status: AC
  Administered 2013-07-20: 25 mg via INTRAVENOUS

## 2013-07-20 MED ORDER — METHYLPREDNISOLONE SODIUM SUCC 125 MG IJ SOLR
INTRAMUSCULAR | Status: AC
  Filled 2013-07-20: qty 2

## 2013-07-20 MED ORDER — SODIUM CHLORIDE 0.9 % IV SOLN
Freq: Once | INTRAVENOUS | Status: AC
  Administered 2013-07-20: 12:00:00 via INTRAVENOUS

## 2013-07-20 MED ORDER — SODIUM CHLORIDE 0.9 % IV SOLN
50.0000 mg/m2 | Freq: Once | INTRAVENOUS | Status: AC
  Administered 2013-07-20: 78 mg via INTRAVENOUS
  Filled 2013-07-20: qty 13

## 2013-07-20 MED ORDER — METHYLPREDNISOLONE SODIUM SUCC 125 MG IJ SOLR
125.0000 mg | Freq: Once | INTRAMUSCULAR | Status: AC
  Administered 2013-07-20: 125 mg via INTRAVENOUS

## 2013-07-20 MED ORDER — HEPARIN SOD (PORK) LOCK FLUSH 100 UNIT/ML IV SOLN
500.0000 [IU] | Freq: Once | INTRAVENOUS | Status: AC | PRN
  Administered 2013-07-20: 500 [IU]
  Filled 2013-07-20: qty 5

## 2013-07-20 NOTE — Patient Instructions (Signed)
[facility] Discharge Instructions for Patients Receiving Chemotherapy  Today you received the following chemotherapy agents : Taxol & Carboplatin  To help prevent nausea and vomiting after your treatment, we encourage you to take your nausea medication as needed:  Zofran 8 mg every 8 hours as needed  Phenergan 12.5 mg every 6 hours as needed   If you develop nausea and vomiting that is not controlled by your nausea medication, call the clinic.   BELOW ARE SYMPTOMS THAT SHOULD BE REPORTED IMMEDIATELY:  *FEVER GREATER THAN 100.5 F  *CHILLS WITH OR WITHOUT FEVER  NAUSEA AND VOMITING THAT IS NOT CONTROLLED WITH YOUR NAUSEA MEDICATION  *UNUSUAL SHORTNESS OF BREATH  *UNUSUAL BRUISING OR BLEEDING  TENDERNESS IN MOUTH AND THROAT WITH OR WITHOUT PRESENCE OF ULCERS  *URINARY PROBLEMS  *BOWEL PROBLEMS  UNUSUAL RASH Items with * indicate a potential emergency and should be followed up as soon as possible.  Feel free to call the clinic you have any questions or concerns. The clinic phone number is [phone].  It has been a pleasure to serve you today!

## 2013-07-20 NOTE — Progress Notes (Signed)
Patient's weight documented as 108.2 pounds on December 1 which is down slightly from 109 pounds November 26.  Patient reports he continues to tolerate Osmolite 1.5 via jejunostomy tube at 100 mL an hour for 12 hours daily.  This is providing patient with 100% of estimated nutrition needs.  Patient denies nausea or problems with his bowels.  Patient does complain of heartburn.  Estimated nutrition needs: 1600-1800 calories, 75-85 g protein, 1.8 L fluid.  Osmolite 1.5 provides 1800 calories, 75 g protein, and 1814 mL free water.  Nutrition diagnosis: Unintended weight loss continues.  Intervention: Patient educated to increase Osmolite 1.5 via jejunostomy tube to goal rate of 120 mL an hour for 12 hours daily to provide greater than 100% of estimated nutrition needs for weight gain.  Teach back method used.  Patient has adequate supply of tube feeding at this time.  Monitoring, evaluation, goals: Patient is tolerating tube feeding but has not gained any weight.  He will work to increase tube feedings slightly to promote weight gain.  Next visit: Wednesday, December 10, during chemotherapy.

## 2013-07-20 NOTE — Progress Notes (Signed)
Patient having significant heartburn today-worse when he takes his decadron premeds. OK per Dr. Truett Perna to give Maalox here and will order Pepcid for home use.

## 2013-07-21 ENCOUNTER — Encounter: Payer: Self-pay | Admitting: Radiation Oncology

## 2013-07-21 ENCOUNTER — Ambulatory Visit
Admission: RE | Admit: 2013-07-21 | Discharge: 2013-07-21 | Disposition: A | Discharge status: Home / Self Care | Payer: Medicaid Other | Source: Ambulatory Visit | Attending: Radiation Oncology | Admitting: Radiation Oncology

## 2013-07-21 VITALS — BP 125/89 | HR 138 | Temp 97.3°F | Resp 20 | Wt 108.0 lb

## 2013-07-21 DIAGNOSIS — C155 Malignant neoplasm of lower third of esophagus: Secondary | ICD-10-CM

## 2013-07-21 NOTE — Progress Notes (Signed)
Department of Radiation Oncology  Phone:  [phone] Fax:        [phone]  Weekly Treatment Note    Name: Henry Leonard Date: 07/21/2013 MRN: [id number] DOB: 12/09/1955   Current dose: 27 Gy  Current fraction: 15    MEDICATIONS: Current Outpatient Prescriptions  Medication Sig Dispense Refill  . acetaminophen (TYLENOL) 160 MG/5ML solution Take 20.3 mLs (650 mg total) by mouth every 4 (four) hours as needed for mild pain.  120 mL  0  . antiseptic oral rinse (BIOTENE) LIQD 15 mLs by Mouth Rinse route 2 times daily at 12 noon and 4 pm.  1 Bottle  3  . chlorhexidine (PERIDEX) 0.12 % solution 15 mLs by Mouth Rinse route 2 (two) times daily.  120 mL  0  . dexamethasone (DECADRON) 2 MG tablet Take 5 tablets (10 mg total) by mouth at bedtime the night before chemo. Take 5 tablets at 6 AM the morning of chemo.  30 tablet  0  . famotidine (PEPCID) 40 MG/5ML suspension Place 5 mLs (40 mg total) into feeding tube 2 (two) times daily.  300 mL  0  . HYDROcodone-acetaminophen (HYCET) 7.5-325 mg/15 ml solution Take 10-15 mLs by mouth every 4 (four) hours as needed (pain).  473 mL  0  . lidocaine-prilocaine (EMLA) cream Apply to port a cath site one hour prior to use. Do not rub in. Cover with plastic.  30 g  2  . magnesium hydroxide (MILK OF MAGNESIA) 400 MG/5ML suspension Place 30 mLs into feeding tube daily as needed for mild constipation or moderate constipation (okay to use J tube for this medication).  360 mL  0  . Morphine Sulfate (MORPHINE CONCENTRATE) 10 mg / 0.5 ml concentrated solution Place 0.5 mLs (10 mg total) into feeding tube every 2 (two) hours as needed for severe pain.  30 mL  0  . Naphazoline HCl (CLEAR EYES OP) Place 2 drops into both eyes daily as needed (For dry or irritated eyes.).      Marland Kitchen nicotine (NICODERM CQ - DOSED IN MG/24 HOURS) 14 mg/24hr patch Place 1 patch (14 mg total) onto the skin daily.  28 patch  0  . Nutritional Supplements (FEEDING SUPPLEMENT, OSMOLITE  1.5 CAL,) LIQD Place 1,000 mLs into feeding tube continuous.  1000 mL  5  . Nutritional Supplements (OSMOLITE) LIQD Take 237 mLs by mouth See admin instructions. He drinks 3-4 cans per day.  10 Can  5  . ondansetron (ZOFRAN) 4 MG tablet Place 1 tablet (4 mg total) into feeding tube every 6 (six) hours as needed for nausea or vomiting.  20 tablet  0  . oxyCODONE-acetaminophen (PERCOCET/ROXICET) 5-325 MG per tablet Place 1-2 tablets into feeding tube every 4 (four) hours as needed for moderate pain.  30 tablet  0  . polyethylene glycol (MIRALAX / GLYCOLAX) packet Take 17 g by mouth daily as needed for moderate constipation (via peg).      . promethazine (PHENERGAN) 6.25 MG/5ML syrup Place 10 mLs (12.5 mg total) into feeding tube every 4 (four) hours as needed for nausea (Call if nausea not controlled.).  120 mL  0  . Water For Irrigation, Sterile (FREE WATER) SOLN Place 150 mLs into feeding tube every 4 (four) hours.  1000 mL  5   No current facility-administered medications for this encounter.     ALLERGIES: Penicillins   LABORATORY DATA:  Lab Results  Component Value Date   WBC 10.3 07/18/2013   HGB  8.4* 07/18/2013   HCT 27.1* 07/18/2013   MCV 85.8 07/18/2013   PLT 487* 07/18/2013   Lab Results  Component Value Date   NA 130* 07/06/2013   K 4.7 07/06/2013   CL 95* 07/01/2013   CO2 25 07/06/2013   Lab Results  Component Value Date   ALT 10 07/06/2013   AST 13 07/06/2013   ALKPHOS 60 07/06/2013   BILITOT <0.20 07/06/2013     NARRATIVE: Henry Leonard was seen today for weekly treatment management. The chart was checked and the patient's films were reviewed. The patient states that he is doing recently well. He feels better today. He is not taking heartburn medicine at this point as the pharmacy had to order this. He does complain of some fatigue.  PHYSICAL EXAMINATION: weight is 108 lb (48.988 kg). His axillary temperature is 97.3 F (36.3 C). His blood pressure is 125/89 and his  pulse is 138. His respiration is 20 and oxygen saturation is 99%.        ASSESSMENT: The patient is doing satisfactorily with treatment.  PLAN: We will continue with the patient's radiation treatment as planned.

## 2013-07-21 NOTE — Progress Notes (Signed)
Weekly rad txs esophagus,  15 conpleted, burping, did get his pain rx filled, waiting on the heartburn medication will p/u today, heartburn c/o abd pain but better, osmolyte 4-5 cans daily  Over about 12 hours at 100 ml/hrand free water via peg,  No nausea, energy level low, fatigued  11:30 AM

## 2013-07-22 ENCOUNTER — Ambulatory Visit
Admission: RE | Admit: 2013-07-22 | Discharge: 2013-07-22 | Disposition: A | Discharge status: Home / Self Care | Payer: Medicaid Other | Source: Ambulatory Visit | Attending: Radiation Oncology | Admitting: Radiation Oncology

## 2013-07-25 ENCOUNTER — Ambulatory Visit
Admission: RE | Admit: 2013-07-25 | Discharge: 2013-07-25 | Disposition: A | Discharge status: Home / Self Care | Payer: Medicaid Other | Source: Ambulatory Visit | Attending: Radiation Oncology | Admitting: Radiation Oncology

## 2013-07-25 DIAGNOSIS — C155 Malignant neoplasm of lower third of esophagus: Secondary | ICD-10-CM

## 2013-07-25 NOTE — Progress Notes (Signed)
Department of Radiation Oncology  Phone:  [phone] Fax:        [phone]  Weekly Treatment Note    Name: Henry Leonard Date: 07/25/2013 MRN: [id number] DOB: 1956/07/19    Current fraction: 17   MEDICATIONS: Current Outpatient Prescriptions  Medication Sig Dispense Refill  . acetaminophen (TYLENOL) 160 MG/5ML solution Take 20.3 mLs (650 mg total) by mouth every 4 (four) hours as needed for mild pain.  120 mL  0  . antiseptic oral rinse (BIOTENE) LIQD 15 mLs by Mouth Rinse route 2 times daily at 12 noon and 4 pm.  1 Bottle  3  . chlorhexidine (PERIDEX) 0.12 % solution 15 mLs by Mouth Rinse route 2 (two) times daily.  120 mL  0  . dexamethasone (DECADRON) 2 MG tablet Take 5 tablets (10 mg total) by mouth at bedtime the night before chemo. Take 5 tablets at 6 AM the morning of chemo.  30 tablet  0  . famotidine (PEPCID) 40 MG/5ML suspension Place 5 mLs (40 mg total) into feeding tube 2 (two) times daily.  300 mL  0  . HYDROcodone-acetaminophen (HYCET) 7.5-325 mg/15 ml solution Take 10-15 mLs by mouth every 4 (four) hours as needed (pain).  473 mL  0  . lidocaine-prilocaine (EMLA) cream Apply to port a cath site one hour prior to use. Do not rub in. Cover with plastic.  30 g  2  . magnesium hydroxide (MILK OF MAGNESIA) 400 MG/5ML suspension Place 30 mLs into feeding tube daily as needed for mild constipation or moderate constipation (okay to use J tube for this medication).  360 mL  0  . Morphine Sulfate (MORPHINE CONCENTRATE) 10 mg / 0.5 ml concentrated solution Place 0.5 mLs (10 mg total) into feeding tube every 2 (two) hours as needed for severe pain.  30 mL  0  . Naphazoline HCl (CLEAR EYES OP) Place 2 drops into both eyes daily as needed (For dry or irritated eyes.).      Marland Kitchen nicotine (NICODERM CQ - DOSED IN MG/24 HOURS) 14 mg/24hr patch Place 1 patch (14 mg total) onto the skin daily.  28 patch  0  . Nutritional Supplements (FEEDING SUPPLEMENT, OSMOLITE 1.5 CAL,) LIQD Place  1,000 mLs into feeding tube continuous.  1000 mL  5  . Nutritional Supplements (OSMOLITE) LIQD Take 237 mLs by mouth See admin instructions. He drinks 3-4 cans per day.  10 Can  5  . ondansetron (ZOFRAN) 4 MG tablet Place 1 tablet (4 mg total) into feeding tube every 6 (six) hours as needed for nausea or vomiting.  20 tablet  0  . oxyCODONE-acetaminophen (PERCOCET/ROXICET) 5-325 MG per tablet Place 1-2 tablets into feeding tube every 4 (four) hours as needed for moderate pain.  30 tablet  0  . polyethylene glycol (MIRALAX / GLYCOLAX) packet Take 17 g by mouth daily as needed for moderate constipation (via peg).      . promethazine (PHENERGAN) 6.25 MG/5ML syrup Place 10 mLs (12.5 mg total) into feeding tube every 4 (four) hours as needed for nausea (Call if nausea not controlled.).  120 mL  0  . Water For Irrigation, Sterile (FREE WATER) SOLN Place 150 mLs into feeding tube every 4 (four) hours.  1000 mL  5   No current facility-administered medications for this encounter.     ALLERGIES: Penicillins   LABORATORY DATA:  Lab Results  Component Value Date   WBC 10.3 07/18/2013   HGB 8.4* 07/18/2013   HCT  27.1* 07/18/2013   MCV 85.8 07/18/2013   PLT 487* 07/18/2013   Lab Results  Component Value Date   NA 130* 07/06/2013   K 4.7 07/06/2013   CL 95* 07/01/2013   CO2 25 07/06/2013   Lab Results  Component Value Date   ALT 10 07/06/2013   AST 13 07/06/2013   ALKPHOS 60 07/06/2013   BILITOT <0.20 07/06/2013     NARRATIVE: Henry Leonard was seen today for weekly treatment management. The chart was checked and the patient's films were reviewed. The patient states he is doing well. He has some occasional nausea but otherwise is without complaints. He did well over the weekend.  PHYSICAL EXAMINATION:  The patient is alert and oriented, in no acute distress.   ASSESSMENT: The patient is doing satisfactorily with treatment.  PLAN: We will continue with the patient's radiation treatment as  planned.

## 2013-07-26 ENCOUNTER — Ambulatory Visit
Admission: RE | Admit: 2013-07-26 | Discharge: 2013-07-26 | Disposition: A | Discharge status: Home / Self Care | Payer: Medicaid Other | Source: Ambulatory Visit | Attending: Radiation Oncology | Admitting: Radiation Oncology

## 2013-07-26 ENCOUNTER — Telehealth: Payer: Self-pay | Admitting: *Deleted

## 2013-07-26 NOTE — Telephone Encounter (Signed)
Per Dr. Truett Perna, notified pt OK to move treatment back one week; call office if any problems.  Pt verbalized understanding and expressed appreciation.  POF complete.

## 2013-07-26 NOTE — Telephone Encounter (Signed)
Received call from pt; states "could the chemo be pushed back one week or can I not have this last one?"  Pt states he "feels rough, stomach burning from radiation tx" Denies fever, little nauseous, using feeding tube as directed.  Asking "is last tx absolutely necessary?"  Did confirm that he would be coming to radiation tx tomorrow 07/27/13.  Note to Dr. Truett Perna.

## 2013-07-27 ENCOUNTER — Ambulatory Visit: Payer: Medicaid Other | Admitting: Nutrition

## 2013-07-27 ENCOUNTER — Ambulatory Visit
Admission: RE | Admit: 2013-07-27 | Discharge: 2013-07-27 | Disposition: A | Discharge status: Home / Self Care | Payer: Medicaid Other | Source: Ambulatory Visit | Attending: Radiation Oncology | Admitting: Radiation Oncology

## 2013-07-27 ENCOUNTER — Telehealth: Payer: Self-pay | Admitting: *Deleted

## 2013-07-27 ENCOUNTER — Ambulatory Visit: Payer: Self-pay

## 2013-07-27 ENCOUNTER — Other Ambulatory Visit: Payer: Self-pay

## 2013-07-27 MED ORDER — HYDROCODONE-ACETAMINOPHEN 7.5-325 MG/15ML PO SOLN: 10.0000 mL | ORAL | Status: DC | PRN

## 2013-07-27 NOTE — Progress Notes (Signed)
I spoke with patient briefly in the lobby today.  His chemotherapy has been canceled and will be rescheduled next week.  He denies tube feeding problems at this time.  He continues to tolerate Osmolite 1.5 via jejunostomy tube at 100 mL an hour for 12 hours daily.  Last weight documented on December 4, was stable at 108 pounds.  Estimated nutrition needs: 1600-1800 calories, 75-85 g protein, 1.8 L fluid.  Osmolite 1.5 provides 1800 calories, 75 g protein, and 1814 milliliters free water.  Nutrition diagnosis: Unintended weight loss improved.  Intervention: Patient educated to increase jejunostomy feedings.  120 mL an hour for 12 hours daily to promote weight gain.  Patient reports an adequate supply of tube feeding at this time.  Monitoring, evaluation, goals: Patient is tolerating tube feeding but has not had weight gain.  He reports he will increase tube feedings.  Next visit: I will followup with patient during next chemotherapy.

## 2013-07-27 NOTE — Telephone Encounter (Signed)
Per staff message and POF I have scheduled appts.  JMW  

## 2013-07-28 ENCOUNTER — Ambulatory Visit
Admission: RE | Admit: 2013-07-28 | Discharge: 2013-07-28 | Disposition: A | Discharge status: Home / Self Care | Payer: Medicaid Other | Source: Ambulatory Visit | Attending: Radiation Oncology | Admitting: Radiation Oncology

## 2013-07-29 ENCOUNTER — Encounter: Payer: Self-pay | Admitting: Radiation Oncology

## 2013-07-29 ENCOUNTER — Ambulatory Visit
Admission: RE | Admit: 2013-07-29 | Discharge: 2013-07-29 | Disposition: A | Discharge status: Home / Self Care | Payer: Medicaid Other | Source: Ambulatory Visit | Attending: Radiation Oncology | Admitting: Radiation Oncology

## 2013-07-29 ENCOUNTER — Ambulatory Visit: Payer: Medicaid Other

## 2013-07-29 VITALS — BP 95/69 | HR 104 | Temp 97.4°F | Resp 22 | Wt 102.1 lb

## 2013-07-29 DIAGNOSIS — C155 Malignant neoplasm of lower third of esophagus: Secondary | ICD-10-CM

## 2013-07-29 NOTE — Progress Notes (Signed)
Department of Radiation Oncology  Phone:  [phone] Fax:        [phone]  Weekly Treatment Note    Name: Henry Leonard Date: 07/29/2013 MRN: [id number] DOB: November 08, 1955   Current dose: 37.8 Gy  Current fraction: 21   MEDICATIONS: Current Outpatient Prescriptions  Medication Sig Dispense Refill  . acetaminophen (TYLENOL) 160 MG/5ML solution Take 20.3 mLs (650 mg total) by mouth every 4 (four) hours as needed for mild pain.  120 mL  0  . antiseptic oral rinse (BIOTENE) LIQD 15 mLs by Mouth Rinse route 2 times daily at 12 noon and 4 pm.  1 Bottle  3  . chlorhexidine (PERIDEX) 0.12 % solution 15 mLs by Mouth Rinse route 2 (two) times daily.  120 mL  0  . dexamethasone (DECADRON) 2 MG tablet Take 5 tablets (10 mg total) by mouth at bedtime the night before chemo. Take 5 tablets at 6 AM the morning of chemo.  30 tablet  0  . famotidine (PEPCID) 40 MG/5ML suspension Place 5 mLs (40 mg total) into feeding tube 2 (two) times daily.  300 mL  0  . HYDROcodone-acetaminophen (HYCET) 7.5-325 mg/15 ml solution Take 10-15 mLs by mouth every 4 (four) hours as needed (pain).  473 mL  0  . lidocaine-prilocaine (EMLA) cream Apply to port a cath site one hour prior to use. Do not rub in. Cover with plastic.  30 g  2  . magnesium hydroxide (MILK OF MAGNESIA) 400 MG/5ML suspension Place 30 mLs into feeding tube daily as needed for mild constipation or moderate constipation (okay to use J tube for this medication).  360 mL  0  . Morphine Sulfate (MORPHINE CONCENTRATE) 10 mg / 0.5 ml concentrated solution Place 0.5 mLs (10 mg total) into feeding tube every 2 (two) hours as needed for severe pain.  30 mL  0  . Naphazoline HCl (CLEAR EYES OP) Place 2 drops into both eyes daily as needed (For dry or irritated eyes.).      Marland Kitchen nicotine (NICODERM CQ - DOSED IN MG/24 HOURS) 14 mg/24hr patch Place 1 patch (14 mg total) onto the skin daily.  28 patch  0  . Nutritional Supplements (FEEDING SUPPLEMENT, OSMOLITE  1.5 CAL,) LIQD Place 1,000 mLs into feeding tube continuous.  1000 mL  5  . Nutritional Supplements (OSMOLITE) LIQD Take 237 mLs by mouth See admin instructions. He drinks 3-4 cans per day.  10 Can  5  . ondansetron (ZOFRAN) 4 MG tablet Place 1 tablet (4 mg total) into feeding tube every 6 (six) hours as needed for nausea or vomiting.  20 tablet  0  . oxyCODONE-acetaminophen (PERCOCET/ROXICET) 5-325 MG per tablet Place 1-2 tablets into feeding tube every 4 (four) hours as needed for moderate pain.  30 tablet  0  . polyethylene glycol (MIRALAX / GLYCOLAX) packet Take 17 g by mouth daily as needed for moderate constipation (via peg).      . promethazine (PHENERGAN) 6.25 MG/5ML syrup Place 10 mLs (12.5 mg total) into feeding tube every 4 (four) hours as needed for nausea (Call if nausea not controlled.).  120 mL  0  . Water For Irrigation, Sterile (FREE WATER) SOLN Place 150 mLs into feeding tube every 4 (four) hours.  1000 mL  5   No current facility-administered medications for this encounter.     ALLERGIES: Penicillins   LABORATORY DATA:  Lab Results  Component Value Date   WBC 10.3 07/18/2013   HGB 8.4*  07/18/2013   HCT 27.1* 07/18/2013   MCV 85.8 07/18/2013   PLT 487* 07/18/2013   Lab Results  Component Value Date   NA 130* 07/06/2013   K 4.7 07/06/2013   CL 95* 07/01/2013   CO2 25 07/06/2013   Lab Results  Component Value Date   ALT 10 07/06/2013   AST 13 07/06/2013   ALKPHOS 60 07/06/2013   BILITOT <0.20 07/06/2013     NARRATIVE: Henry Leonard was seen today for weekly treatment management. The chart was checked and the patient's films were reviewed. The patient complains of some fatigue. Stomach upset occasionally. No bowel movements for approximately one week. He is beginning MiraLax medication for this.  PHYSICAL EXAMINATION: weight is 102 lb 1.6 oz (46.312 kg). His axillary temperature is 97.4 F (36.3 C). His blood pressure is 95/69 and his pulse is 104. His  respiration is 22 and oxygen saturation is 95%.        ASSESSMENT: The patient is doing satisfactorily with treatment.  PLAN: We will continue with the patient's radiation treatment as planned. The patient's weight has trended down. He was seen by the nutritionist on 12/10. He is increasing his intake accordingly.

## 2013-07-29 NOTE — Progress Notes (Signed)
Weekly rad txs esophageal 21 compleetd,  Weak, fatigued, stomach upset, no bm several days, has miralax  says he'll start using today, pain in lower part of chest, coughing up brownish pheglm, osmolite 4 cans yesterday via jejunostomy tube, saw barbar neff  07/27/13  11:42 AM  11:41 AM

## 2013-08-01 ENCOUNTER — Ambulatory Visit: Payer: Medicaid Other

## 2013-08-01 ENCOUNTER — Ambulatory Visit
Admission: RE | Admit: 2013-08-01 | Discharge: 2013-08-01 | Disposition: A | Discharge status: Home / Self Care | Payer: Medicaid Other | Source: Ambulatory Visit | Attending: Radiation Oncology | Admitting: Radiation Oncology

## 2013-08-02 ENCOUNTER — Other Ambulatory Visit: Payer: Self-pay | Admitting: Radiation Oncology

## 2013-08-02 ENCOUNTER — Ambulatory Visit: Payer: Medicaid Other

## 2013-08-02 ENCOUNTER — Ambulatory Visit
Admission: RE | Admit: 2013-08-02 | Discharge: 2013-08-02 | Disposition: A | Discharge status: Home / Self Care | Payer: Medicaid Other | Source: Ambulatory Visit | Attending: Radiation Oncology | Admitting: Radiation Oncology

## 2013-08-02 MED ORDER — HYDROCODONE-ACETAMINOPHEN 7.5-325 MG/15ML PO SOLN: 10.0000 mL | ORAL | Status: DC | PRN

## 2013-08-03 ENCOUNTER — Telehealth: Payer: Self-pay | Admitting: *Deleted

## 2013-08-03 ENCOUNTER — Ambulatory Visit: Payer: Medicaid Other

## 2013-08-03 ENCOUNTER — Ambulatory Visit
Admission: RE | Admit: 2013-08-03 | Discharge: 2013-08-03 | Disposition: A | Discharge status: Home / Self Care | Payer: Medicaid Other | Source: Ambulatory Visit | Attending: Radiation Oncology | Admitting: Radiation Oncology

## 2013-08-03 NOTE — Telephone Encounter (Signed)
Call from pt asking if he should take Decadron pre-med prior to chemo tomorrow. YES. (Pt had hypersensitivity type reaction on Cycle 2 of Taxol.)

## 2013-08-04 ENCOUNTER — Other Ambulatory Visit (HOSPITAL_BASED_OUTPATIENT_CLINIC_OR_DEPARTMENT_OTHER): Payer: Medicaid Other

## 2013-08-04 ENCOUNTER — Telehealth: Payer: Self-pay | Admitting: Oncology

## 2013-08-04 ENCOUNTER — Ambulatory Visit
Admission: RE | Admit: 2013-08-04 | Discharge: 2013-08-04 | Disposition: A | Discharge status: Home / Self Care | Payer: Medicaid Other | Source: Ambulatory Visit | Attending: Radiation Oncology | Admitting: Radiation Oncology

## 2013-08-04 ENCOUNTER — Ambulatory Visit: Payer: Medicaid Other

## 2013-08-04 ENCOUNTER — Other Ambulatory Visit: Payer: Self-pay | Admitting: Oncology

## 2013-08-04 ENCOUNTER — Ambulatory Visit (HOSPITAL_BASED_OUTPATIENT_CLINIC_OR_DEPARTMENT_OTHER): Payer: Medicaid Other

## 2013-08-04 ENCOUNTER — Ambulatory Visit (HOSPITAL_BASED_OUTPATIENT_CLINIC_OR_DEPARTMENT_OTHER): Payer: Medicaid Other | Admitting: Nurse Practitioner

## 2013-08-04 VITALS — BP 107/75 | HR 120 | Temp 97.1°F | Resp 18 | Ht 66.0 in | Wt 109.1 lb

## 2013-08-04 DIAGNOSIS — C029 Malignant neoplasm of tongue, unspecified: Secondary | ICD-10-CM

## 2013-08-04 DIAGNOSIS — R911 Solitary pulmonary nodule: Secondary | ICD-10-CM

## 2013-08-04 DIAGNOSIS — C155 Malignant neoplasm of lower third of esophagus: Secondary | ICD-10-CM

## 2013-08-04 DIAGNOSIS — F17200 Nicotine dependence, unspecified, uncomplicated: Secondary | ICD-10-CM

## 2013-08-04 DIAGNOSIS — R1319 Other dysphagia: Secondary | ICD-10-CM

## 2013-08-04 DIAGNOSIS — Z5111 Encounter for antineoplastic chemotherapy: Secondary | ICD-10-CM

## 2013-08-04 LAB — CBC WITH DIFFERENTIAL/PLATELET
BASO%: 0.7 % (ref 0.0–2.0)
EOS%: 0 % (ref 0.0–7.0)
HCT: 27 % — ABNORMAL LOW (ref 38.4–49.9)
LYMPH%: 2.8 % — ABNORMAL LOW (ref 14.0–49.0)
MCH: 27.3 pg (ref 27.2–33.4)
MCHC: 30 g/dL — ABNORMAL LOW (ref 32.0–36.0)
MCV: 90.9 fL (ref 79.3–98.0)
MONO#: 0.1 10*3/uL (ref 0.1–0.9)
MONO%: 3 % (ref 0.0–14.0)
NEUT#: 4 10*3/uL (ref 1.5–6.5)
NEUT%: 93.5 % — ABNORMAL HIGH (ref 39.0–75.0)
Platelets: 640 10*3/uL — ABNORMAL HIGH (ref 140–400)
RDW: 20.9 % — ABNORMAL HIGH (ref 11.0–14.6)

## 2013-08-04 LAB — BASIC METABOLIC PANEL (CC13)
BUN: 16.1 mg/dL (ref 7.0–26.0)
CO2: 25 mEq/L (ref 22–29)
Chloride: 96 mEq/L — ABNORMAL LOW (ref 98–109)
Glucose: 139 mg/dl (ref 70–140)
Potassium: 4.5 mEq/L (ref 3.5–5.1)

## 2013-08-04 MED ORDER — SODIUM CHLORIDE 0.9 % IJ SOLN
10.0000 mL | INTRAMUSCULAR | Status: DC | PRN
  Administered 2013-08-04: 10 mL
  Filled 2013-08-04: qty 10

## 2013-08-04 MED ORDER — ONDANSETRON 16 MG/50ML IVPB (CHCC)
INTRAVENOUS | Status: AC
  Filled 2013-08-04: qty 16

## 2013-08-04 MED ORDER — SODIUM CHLORIDE 0.9 % IV SOLN
50.0000 mg/m2 | Freq: Once | INTRAVENOUS | Status: AC
  Administered 2013-08-04: 78 mg via INTRAVENOUS
  Filled 2013-08-04: qty 13

## 2013-08-04 MED ORDER — FAMOTIDINE IN NACL 20-0.9 MG/50ML-% IV SOLN
INTRAVENOUS | Status: AC
  Filled 2013-08-04: qty 50

## 2013-08-04 MED ORDER — FAMOTIDINE IN NACL 20-0.9 MG/50ML-% IV SOLN
20.0000 mg | Freq: Once | INTRAVENOUS | Status: AC
  Administered 2013-08-04: 20 mg via INTRAVENOUS

## 2013-08-04 MED ORDER — METHYLPREDNISOLONE SODIUM SUCC 125 MG IJ SOLR
INTRAMUSCULAR | Status: AC
  Filled 2013-08-04: qty 2

## 2013-08-04 MED ORDER — HEPARIN SOD (PORK) LOCK FLUSH 100 UNIT/ML IV SOLN
500.0000 [IU] | Freq: Once | INTRAVENOUS | Status: AC | PRN
  Administered 2013-08-04: 500 [IU]
  Filled 2013-08-04: qty 5

## 2013-08-04 MED ORDER — ONDANSETRON 16 MG/50ML IVPB (CHCC)
16.0000 mg | Freq: Once | INTRAVENOUS | Status: AC
  Administered 2013-08-04: 16 mg via INTRAVENOUS

## 2013-08-04 MED ORDER — DIPHENHYDRAMINE HCL 50 MG/ML IJ SOLN
INTRAMUSCULAR | Status: AC
  Filled 2013-08-04: qty 1

## 2013-08-04 MED ORDER — METHYLPREDNISOLONE SODIUM SUCC 125 MG IJ SOLR
125.0000 mg | Freq: Once | INTRAMUSCULAR | Status: AC
Start: 2013-08-04 — End: 2013-08-04
  Administered 2013-08-04: 125 mg via INTRAVENOUS

## 2013-08-04 MED ORDER — SODIUM CHLORIDE 0.9 % IV SOLN
Freq: Once | INTRAVENOUS | Status: AC
  Administered 2013-08-04: 12:00:00 via INTRAVENOUS

## 2013-08-04 MED ORDER — DIPHENHYDRAMINE HCL 50 MG/ML IJ SOLN
25.0000 mg | Freq: Once | INTRAMUSCULAR | Status: AC
  Administered 2013-08-04: 25 mg via INTRAVENOUS

## 2013-08-04 MED ORDER — SODIUM CHLORIDE 0.9 % IV SOLN
236.0000 mg | Freq: Once | INTRAVENOUS | Status: AC
  Administered 2013-08-04: 240 mg via INTRAVENOUS
  Filled 2013-08-04: qty 24

## 2013-08-04 NOTE — Telephone Encounter (Signed)
Gave pt appt for lab and MD on January 2015 °

## 2013-08-04 NOTE — Patient Instructions (Signed)
[facility] Discharge Instructions for Patients Receiving Chemotherapy  Today you received the following chemotherapy agents: Carboplatin, Taxol   To help prevent nausea and vomiting after your treatment, we encourage you to take your nausea medication as prescribed.    If you develop nausea and vomiting that is not controlled by your nausea medication, call the clinic.   BELOW ARE SYMPTOMS THAT SHOULD BE REPORTED IMMEDIATELY:  *FEVER GREATER THAN 100.5 F  *CHILLS WITH OR WITHOUT FEVER  NAUSEA AND VOMITING THAT IS NOT CONTROLLED WITH YOUR NAUSEA MEDICATION  *UNUSUAL SHORTNESS OF BREATH  *UNUSUAL BRUISING OR BLEEDING  TENDERNESS IN MOUTH AND THROAT WITH OR WITHOUT PRESENCE OF ULCERS  *URINARY PROBLEMS  *BOWEL PROBLEMS  UNUSUAL RASH Items with * indicate a potential emergency and should be followed up as soon as possible.  Feel free to call the clinic you have any questions or concerns. The clinic phone number is [phone].    

## 2013-08-04 NOTE — Progress Notes (Signed)
Met with patient to assess for needs.  He is followed by dietician.  He reports fatigue as his main complaint.  He stated he has a good support system of family and his roommate.  He is going to see Dr. Lenis Noon for follow-up next week.  He denies needing any assist with arrangements for this visit.  Will continue to follow as needed.

## 2013-08-04 NOTE — Progress Notes (Signed)
OFFICE PROGRESS NOTE  Interval history:  Mr. Ehly returns as scheduled. The fifth and final planned cycle of chemotherapy on 07/27/2013 was held at his request. He states that he "felt bad" last week. He is feeling better now. He continues to be fatigued. He denies nausea/vomiting. His mouth is dry. No mouth sores. He is intermittently constipated and takes MiraLAX as needed. No numbness or tingling in his hands or feet. Main source of nutrition continues to be via the feeding tube. He is able to swallow water without difficulty. He occasionally notes pain with swallowing.   Objective: Blood pressure 107/75, pulse 140, temperature 97.1 F (36.2 C), temperature source Oral, resp. rate 18, height 5\' 6"  (1.676 m), weight 109 lb 1.6 oz (49.487 kg). repeat manual pulse 120.  No thrush or ulcerations. The tongue appears dry. Lungs are clear. No wheezes or rales. Regular cardiac rhythm. Tachycardic. Port-A-Cath site is without erythema. Abdomen is soft and nontender. No hepatomegaly. Left upper quadrant feeding tube site is without erythema. No leg edema.  Lab Results: Lab Results  Component Value Date   WBC 4.3 08/04/2013   HGB 8.1* 08/04/2013   HCT 27.0* 08/04/2013   MCV 90.9 08/04/2013   PLT 640* 08/04/2013    Chemistry:    Chemistry      Component Value Date/Time   NA 130* 07/06/2013 1054   NA 130* 07/01/2013 0600   K 4.7 07/06/2013 1054   K 3.6 07/01/2013 0600   CL 95* 07/01/2013 0600   CO2 25 07/06/2013 1054   CO2 28 07/01/2013 0600   BUN 14.0 07/06/2013 1054   BUN 6 07/01/2013 0600   CREATININE 0.5* 07/06/2013 1054   CREATININE 0.31* 07/01/2013 0600      Component Value Date/Time   CALCIUM 9.5 07/06/2013 1054   CALCIUM 8.6 07/01/2013 0600   ALKPHOS 60 07/06/2013 1054   ALKPHOS 58 12/04/2011 1505   AST 13 07/06/2013 1054   AST 14 12/04/2011 1505   ALT 10 07/06/2013 1054   ALT 8 12/04/2011 1505   BILITOT <0.20 07/06/2013 1054   BILITOT 0.2* 12/04/2011 1505        Studies/Results: No results found.  Medications: I have reviewed the patient's current medications.  Assessment/Plan:  1. 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  Initiation of concurrent radiation and weekly Taxol/carboplatin on 06/22/2013.  Week 2 on 07/06/2013, allergic reaction early in the Taxol infusion, he received carboplatin on only  Week 3 on 07/13/2013, no allergic reaction after Decadron/Solu-Medrol premedication. Week 4 on 07/20/2013. 2. Spiculated left lower lobe nodule with low level FDG activity on the staging PET scan 05/26/2013  3. Tongue cancer 2013,[id number], status post Erlotonib, glossectomy/neck dissection, and adjuvant cisplatin/radiation in 2013  4. Ongoing tobacco use  5. History of alcohol use  6. Solid/liquid dysphagia secondary to #1.  7. Status post placement of a surgical J-tube 06/28/2013.  8. Hospitalization 06/27/2013 through 07/03/2013 with dehydration, solid/liquid dysphagia. A J-tube was placed by surgery.  9. Nutrition via J-tube.  10. Allergic reaction to Taxol on 07/06/2013-no reaction with treatment 07/13/2013 after home Decadron and pre-Taxol Solu-Medrol.  Disposition-he appears stable. Plan to proceed with the fifth and final weekly Taxol/carboplatin today as scheduled. He is scheduled to complete the course of radiation on 08/09/2013. He reports a followup visit with Dr. Lenis Noon in January. We will  see him back on 09/02/2013. He will contact the office in the interim with any problems.  Plan reviewed with Dr. Truett Perna.   Lonna Cobb ANP/GNP-BC

## 2013-08-05 ENCOUNTER — Ambulatory Visit
Admission: RE | Admit: 2013-08-05 | Discharge: 2013-08-05 | Disposition: A | Discharge status: Home / Self Care | Payer: Medicaid Other | Source: Ambulatory Visit | Attending: Radiation Oncology | Admitting: Radiation Oncology

## 2013-08-05 ENCOUNTER — Encounter: Payer: Self-pay | Admitting: Radiation Oncology

## 2013-08-05 VITALS — BP 119/83 | HR 112 | Temp 97.6°F | Resp 20 | Wt 109.1 lb

## 2013-08-05 DIAGNOSIS — C155 Malignant neoplasm of lower third of esophagus: Secondary | ICD-10-CM

## 2013-08-05 NOTE — Progress Notes (Signed)
Department of Radiation Oncology  Phone:  [phone] Fax:        [phone]  Weekly Treatment Note    Name: Henry Leonard Date: 08/05/2013 MRN: [id number] DOB: 06-Jan-1956   Current dose: 46.8 Gy  Current fraction: 26   MEDICATIONS: Current Outpatient Prescriptions  Medication Sig Dispense Refill  . acetaminophen (TYLENOL) 160 MG/5ML solution Take 20.3 mLs (650 mg total) by mouth every 4 (four) hours as needed for mild pain.  120 mL  0  . antiseptic oral rinse (BIOTENE) LIQD 15 mLs by Mouth Rinse route 2 times daily at 12 noon and 4 pm.  1 Bottle  3  . chlorhexidine (PERIDEX) 0.12 % solution 15 mLs by Mouth Rinse route 2 (two) times daily.  120 mL  0  . dexamethasone (DECADRON) 2 MG tablet Take 5 tablets (10 mg total) by mouth at bedtime the night before chemo. Take 5 tablets at 6 AM the morning of chemo.  30 tablet  0  . famotidine (PEPCID) 40 MG/5ML suspension Place 5 mLs (40 mg total) into feeding tube 2 (two) times daily.  300 mL  0  . HYDROcodone-acetaminophen (HYCET) 7.5-325 mg/15 ml solution Take 10-15 mLs by mouth every 4 (four) hours as needed (pain).  473 mL  0  . lidocaine-prilocaine (EMLA) cream Apply to port a cath site one hour prior to use. Do not rub in. Cover with plastic.  30 g  2  . magnesium hydroxide (MILK OF MAGNESIA) 400 MG/5ML suspension Place 30 mLs into feeding tube daily as needed for mild constipation or moderate constipation (okay to use J tube for this medication).  360 mL  0  . Morphine Sulfate (MORPHINE CONCENTRATE) 10 mg / 0.5 ml concentrated solution Place 0.5 mLs (10 mg total) into feeding tube every 2 (two) hours as needed for severe pain.  30 mL  0  . Naphazoline HCl (CLEAR EYES OP) Place 2 drops into both eyes daily as needed (For dry or irritated eyes.).      Marland Kitchen nicotine (NICODERM CQ - DOSED IN MG/24 HOURS) 14 mg/24hr patch Place 1 patch (14 mg total) onto the skin daily.  28 patch  0  . Nutritional Supplements (FEEDING SUPPLEMENT, OSMOLITE  1.5 CAL,) LIQD Place 1,000 mLs into feeding tube continuous.  1000 mL  5  . Nutritional Supplements (OSMOLITE) LIQD Take 237 mLs by mouth See admin instructions. He drinks 3-4 cans per day.  10 Can  5  . ondansetron (ZOFRAN) 4 MG tablet Place 1 tablet (4 mg total) into feeding tube every 6 (six) hours as needed for nausea or vomiting.  20 tablet  0  . oxyCODONE-acetaminophen (PERCOCET/ROXICET) 5-325 MG per tablet Place 1-2 tablets into feeding tube every 4 (four) hours as needed for moderate pain.  30 tablet  0  . polyethylene glycol (MIRALAX / GLYCOLAX) packet Take 17 g by mouth daily as needed for moderate constipation (via peg).      . promethazine (PHENERGAN) 6.25 MG/5ML syrup Place 10 mLs (12.5 mg total) into feeding tube every 4 (four) hours as needed for nausea (Call if nausea not controlled.).  120 mL  0  . Water For Irrigation, Sterile (FREE WATER) SOLN Place 150 mLs into feeding tube every 4 (four) hours.  1000 mL  5   No current facility-administered medications for this encounter.     ALLERGIES: Penicillins   LABORATORY DATA:  Lab Results  Component Value Date   WBC 4.3 08/04/2013   HGB 8.1*  08/04/2013   HCT 27.0* 08/04/2013   MCV 90.9 08/04/2013   PLT 640* 08/04/2013   Lab Results  Component Value Date   NA 132* 08/04/2013   K 4.5 08/04/2013   CL 95* 07/01/2013   CO2 25 08/04/2013   Lab Results  Component Value Date   ALT 10 07/06/2013   AST 13 07/06/2013   ALKPHOS 60 07/06/2013   BILITOT <0.20 07/06/2013     NARRATIVE: Henry Leonard was seen today for weekly treatment management. The chart was checked and the patient's films were reviewed. The patient states that he continues to do well. Some fatigue. No new complaints.  PHYSICAL EXAMINATION: weight is 109 lb 1.6 oz (49.487 kg). His axillary temperature is 97.6 F (36.4 C). His blood pressure is 119/83 and his pulse is 112. His respiration is 20.        ASSESSMENT: The patient is doing satisfactorily with  treatment.  PLAN: We will continue with the patient's radiation treatment as planned. The patient is doing well at the end of his treatment. We will finish up his treatment next week and I will see him for followup in one month.

## 2013-08-05 NOTE — Progress Notes (Signed)
weekly rad txs esophagus , still has fatigue, chemotherapy yesterday, had good bowel movement yesterday,feels some better, denies nausea/vomiting, 12:09 PM

## 2013-08-08 ENCOUNTER — Ambulatory Visit: Payer: Medicaid Other

## 2013-08-08 ENCOUNTER — Ambulatory Visit
Admission: RE | Admit: 2013-08-08 | Discharge: 2013-08-08 | Disposition: A | Discharge status: Home / Self Care | Payer: Medicaid Other | Source: Ambulatory Visit | Attending: Radiation Oncology | Admitting: Radiation Oncology

## 2013-08-08 ENCOUNTER — Other Ambulatory Visit: Payer: Self-pay | Admitting: Radiation Oncology

## 2013-08-08 DIAGNOSIS — C029 Malignant neoplasm of tongue, unspecified: Secondary | ICD-10-CM

## 2013-08-08 MED ORDER — HYDROCODONE-ACETAMINOPHEN 7.5-325 MG/15ML PO SOLN: 10.0000 mL | Freq: Four times a day (QID) | ORAL | Status: DC | PRN

## 2013-08-08 NOTE — Progress Notes (Signed)
Chart note: The patient's Hycet prescription was refilled today. Dispense 473 ML, no refills.

## 2013-08-09 ENCOUNTER — Ambulatory Visit
Admission: RE | Admit: 2013-08-09 | Discharge: 2013-08-09 | Disposition: A | Discharge status: Home / Self Care | Payer: Medicaid Other | Source: Ambulatory Visit | Attending: Radiation Oncology | Admitting: Radiation Oncology

## 2013-08-09 ENCOUNTER — Encounter: Payer: Self-pay | Admitting: Radiation Oncology

## 2013-08-09 ENCOUNTER — Ambulatory Visit: Payer: Medicaid Other

## 2013-08-15 ENCOUNTER — Other Ambulatory Visit: Payer: Self-pay | Admitting: Radiation Oncology

## 2013-08-15 DIAGNOSIS — C029 Malignant neoplasm of tongue, unspecified: Secondary | ICD-10-CM

## 2013-08-15 DIAGNOSIS — T66XXXA Radiation sickness, unspecified, initial encounter: Secondary | ICD-10-CM

## 2013-08-15 MED ORDER — HYDROCODONE-ACETAMINOPHEN 7.5-325 MG/15ML PO SOLN: 10.0000 mL | Freq: Four times a day (QID) | ORAL | Status: DC | PRN

## 2013-08-21 NOTE — Progress Notes (Signed)
  Radiation Oncology         [phone] ________________________________  Name: Henry Leonard MRN: [id number]  Date: 08/09/2013  DOB: Oct 26, 1955  End of Treatment Note  Diagnosis:   Esophageal cancer     Indication for treatment:  Curative       Radiation treatment dates:   06/22/2013 through 08/09/2013  Site/dose:   The patient was treated using a 3-D conformal technique to the esophageal tumor and surrounding high-risk regions. Initially he was treated with a 4 field technique to a dose of 45 gray. The patient then received a 5.4 gray boost with a 4 field cone down technique. The patient's total dose was 50.4 gray. Daily image guidance was used for his treatment.  Narrative: The patient tolerated radiation treatment relatively well.   The patient experienced some esophageal irritation during treatment as expected. No significant difficulties with nausea.  Plan: The patient has completed radiation treatment. The patient will return to radiation oncology clinic for routine followup in one month. I advised the patient to call or return sooner if they have any questions or concerns related to their recovery or treatment. ________________________________  Jodelle Gross, M.D., Ph.D.

## 2013-08-22 ENCOUNTER — Telehealth: Payer: Self-pay | Admitting: *Deleted

## 2013-08-22 NOTE — Telephone Encounter (Signed)
Return voice message call, spoke with patient, he is asking for refill on his hycet  Pain rx again, last written  By Dr.Manning and picked up 08/16/13 , he was instructed that rx should last at least 10=11 days, it has only ben 7 days so far, patient given plastic cups that mark 71ml, he was aware of this, asking "I'm all right but when can I get this refilled"? Told Mr. Mudrick I would need to speak with Dr.Moody and earliest day would be THursday, he said he would call back on THursday 12:50 PM

## 2013-08-24 ENCOUNTER — Other Ambulatory Visit: Payer: Self-pay | Admitting: Radiation Oncology

## 2013-08-24 ENCOUNTER — Telehealth: Payer: Self-pay | Admitting: *Deleted

## 2013-08-24 DIAGNOSIS — T66XXXA Radiation sickness, unspecified, initial encounter: Secondary | ICD-10-CM

## 2013-08-24 DIAGNOSIS — C029 Malignant neoplasm of tongue, unspecified: Secondary | ICD-10-CM

## 2013-08-24 MED ORDER — HYDROCODONE-ACETAMINOPHEN 7.5-325 MG/15ML PO SOLN: 10.0000 mL | Freq: Four times a day (QID) | ORAL | Status: DC | PRN

## 2013-08-24 NOTE — Telephone Encounter (Signed)
Called patient home can pick up pain RX,must bring photo ID, asked if he needed more of the plastic sups to measure 42ml of the pain liquid hycet, to take as directed,"patient stated he has 5 of those cups and can wash them out and he will only take as directed"thanked Dr.moody for the rx he will send his male cousin to pick up here in radiation at nursing 2:26 PM

## 2013-09-02 ENCOUNTER — Other Ambulatory Visit (HOSPITAL_BASED_OUTPATIENT_CLINIC_OR_DEPARTMENT_OTHER): Payer: Medicaid Other

## 2013-09-02 ENCOUNTER — Ambulatory Visit (HOSPITAL_BASED_OUTPATIENT_CLINIC_OR_DEPARTMENT_OTHER): Payer: Medicaid Other | Admitting: Oncology

## 2013-09-02 ENCOUNTER — Telehealth: Payer: Self-pay | Admitting: Oncology

## 2013-09-02 ENCOUNTER — Encounter: Payer: Self-pay | Admitting: Radiation Oncology

## 2013-09-02 VITALS — BP 102/77 | HR 140 | Temp 97.7°F | Resp 18 | Ht 66.0 in | Wt 99.7 lb

## 2013-09-02 DIAGNOSIS — R911 Solitary pulmonary nodule: Secondary | ICD-10-CM

## 2013-09-02 DIAGNOSIS — F17200 Nicotine dependence, unspecified, uncomplicated: Secondary | ICD-10-CM

## 2013-09-02 DIAGNOSIS — C155 Malignant neoplasm of lower third of esophagus: Secondary | ICD-10-CM

## 2013-09-02 DIAGNOSIS — C029 Malignant neoplasm of tongue, unspecified: Secondary | ICD-10-CM

## 2013-09-02 DIAGNOSIS — R1310 Dysphagia, unspecified: Secondary | ICD-10-CM

## 2013-09-02 LAB — CBC WITH DIFFERENTIAL/PLATELET
BASO%: 1 % (ref 0.0–2.0)
Basophils Absolute: 0.1 10*3/uL (ref 0.0–0.1)
EOS ABS: 0.1 10*3/uL (ref 0.0–0.5)
EOS%: 1.7 % (ref 0.0–7.0)
HCT: 33.8 % — ABNORMAL LOW (ref 38.4–49.9)
HGB: 10.6 g/dL — ABNORMAL LOW (ref 13.0–17.1)
LYMPH#: 0.5 10*3/uL — AB (ref 0.9–3.3)
LYMPH%: 9.5 % — AB (ref 14.0–49.0)
MCH: 28.5 pg (ref 27.2–33.4)
MCHC: 31.4 g/dL — ABNORMAL LOW (ref 32.0–36.0)
MCV: 90.9 fL (ref 79.3–98.0)
MONO#: 0.6 10*3/uL (ref 0.1–0.9)
MONO%: 12.2 % (ref 0.0–14.0)
NEUT%: 75.6 % — ABNORMAL HIGH (ref 39.0–75.0)
NEUTROS ABS: 4 10*3/uL (ref 1.5–6.5)
NRBC: 0 % (ref 0–0)
PLATELETS: 407 10*3/uL — AB (ref 140–400)
RBC: 3.72 10*6/uL — AB (ref 4.20–5.82)
RDW: 19.3 % — AB (ref 11.0–14.6)
WBC: 5.2 10*3/uL (ref 4.0–10.3)

## 2013-09-02 NOTE — Progress Notes (Signed)
   [place]    OFFICE PROGRESS NOTE   INTERVAL HISTORY:   He returns for scheduled followup of esophagus cancer. He completed radiation 08/09/2013. Henry Leonard reports the jejunostomy tube has not been functioning for the past 5-6 days. He has been taking nutrition supplements as tolerated by mouth. He was evaluated at [facility] yesterday. This included restaging CT scans and he is scheduled for a followup visit with Dr. Clovis Riley next week.    Objective:  Vital signs in last 24 hours:  Blood pressure 102/77, pulse 140, temperature 97.7 F (36.5 C), resp. rate 18, height 5\' 6"  (1.676 m), weight 99 lb 11.2 oz (45.224 kg), SpO2 100.00%. repeat manual pulse 110 -120    HEENT: The mouth is dry, no thrush Resp: Lungs clear bilaterally Cardio: Regular rate and rhythm, GI: No hepatomegaly, jejunostomy tube site without evidence of infection Vascular: No leg edema   Portacath/PICC-without erythema  Lab Results:  Lab Results  Component Value Date   WBC 5.2 09/02/2013   HGB 10.6* 09/02/2013   HCT 33.8* 09/02/2013   MCV 90.9 09/02/2013   PLT 407* 09/02/2013   NEUTROABS 4.0 09/02/2013      Medications: I have reviewed the patient's current medications.  Assessment/Plan: 1. 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  Initiation of concurrent radiation and weekly Taxol/carboplatin on 06/22/2013.  Week 2 on 07/06/2013, allergic reaction early in the Taxol infusion, he received carboplatin on only  Week 3 on 07/13/2013, no allergic reaction after Decadron/Solu-Medrol premedication.  Week 4 on 07/20/2013. Week 5 on 08/04/2013 Radiation completed 08/09/2013 2. Spiculated left lower lobe nodule with low level FDG activity on the staging PET scan 05/26/2013  3.  Tongue cancer 2013,ypT2N2cM0, status post Erlotonib, glossectomy/neck dissection, and adjuvant cisplatin/radiation in 2013  4. Ongoing tobacco use  5. History of alcohol use  6. Solid/liquid dysphagia secondary to #1.  7. Status post placement of a surgical J-tube 06/28/2013.  8. Hospitalization 06/27/2013 through 07/03/2013 with dehydration, solid/liquid dysphagia. A J-tube was placed by surgery.  9. Nutrition via J-tube.  10. Allergic reaction to Taxol on 07/06/2013-no reaction with treatment 07/13/2013 after home Decadron and pre-Taxol Solu-Medrol.    Disposition:  He has completed treatment with chemotherapy/radiation for squamous cell carcinoma of the esophagus. He is scheduled to see Dr. Clovis Riley next week to consider the indication for surgery. We will followup on the CT results from 09/01/2013.  The oncology nursing staff was able to get the jejunostomy tube to flush. He will resume tube feedings today. I encouraged him to increase his fluid intake as tolerated.  He will return for a Port-A-Cath flush next week. Henry Leonard will return for an office visit in one month.   Betsy Coder, MD  09/02/2013  1:29 PM

## 2013-09-02 NOTE — Telephone Encounter (Signed)
Gave pt appt for lab and MD for January and february 2015

## 2013-09-07 ENCOUNTER — Telehealth: Payer: Self-pay | Admitting: *Deleted

## 2013-09-07 NOTE — Telephone Encounter (Signed)
Called pt, his feeding tube is working well. Informed him he does not need office visit on 1/22. He understood, said he thought that was a mistake. Appt. Canceled.

## 2013-09-08 ENCOUNTER — Ambulatory Visit: Payer: Self-pay | Admitting: Nurse Practitioner

## 2013-09-08 ENCOUNTER — Ambulatory Visit
Admission: RE | Admit: 2013-09-08 | Discharge: 2013-09-08 | Disposition: A | Discharge status: Home / Self Care | Payer: Medicaid Other | Source: Ambulatory Visit | Attending: Radiation Oncology | Admitting: Radiation Oncology

## 2013-09-08 ENCOUNTER — Other Ambulatory Visit: Payer: Self-pay

## 2013-09-08 ENCOUNTER — Encounter: Payer: Self-pay | Admitting: Radiation Oncology

## 2013-09-08 ENCOUNTER — Ambulatory Visit (HOSPITAL_BASED_OUTPATIENT_CLINIC_OR_DEPARTMENT_OTHER): Payer: Medicaid Other

## 2013-09-08 VITALS — BP 107/81 | HR 130 | Temp 97.6°F | Resp 20 | Ht 66.0 in | Wt 104.8 lb

## 2013-09-08 VITALS — BP 102/75 | HR 123 | Temp 97.4°F

## 2013-09-08 DIAGNOSIS — C155 Malignant neoplasm of lower third of esophagus: Secondary | ICD-10-CM

## 2013-09-08 DIAGNOSIS — Z95828 Presence of other vascular implants and grafts: Secondary | ICD-10-CM

## 2013-09-08 DIAGNOSIS — Z452 Encounter for adjustment and management of vascular access device: Secondary | ICD-10-CM

## 2013-09-08 HISTORY — DX: Personal history of irradiation: Z92.3

## 2013-09-08 MED ORDER — SODIUM CHLORIDE 0.9 % IJ SOLN
10.0000 mL | INTRAMUSCULAR | Status: DC | PRN
  Administered 2013-09-08: 10 mL via INTRAVENOUS
  Filled 2013-09-08: qty 10

## 2013-09-08 MED ORDER — HEPARIN SOD (PORK) LOCK FLUSH 100 UNIT/ML IV SOLN
500.0000 [IU] | Freq: Once | INTRAVENOUS | Status: AC
  Administered 2013-09-08: 500 [IU] via INTRAVENOUS
  Filled 2013-09-08: qty 5

## 2013-09-08 NOTE — Patient Instructions (Signed)

## 2013-09-08 NOTE — Progress Notes (Signed)
Follow up esophageal rad txs 06/22/13-08/09/13, coughing up white/yellowish phelgm, loose congestive cough, peg feedings osmolite 4 cans daily on pump , , with water, takes sips water , has dry mouth, feels better and getting strength back slowly,  No c/o nausea, stomach upset at times, sees Surgeon Dr. Clovis Riley tomorrow at [facility],  11:14 AM

## 2013-09-08 NOTE — Progress Notes (Addendum)
Radiation Oncology         [phone] ________________________________  Name: Henry Leonard MRN: [id number]  Date: 09/08/2013  DOB: Aug 26, 1955  Follow-Up Visit Note  CC: Leisa Lenz, MD  Beverlee Nims Will*  Diagnosis:    Esophageal cancer  Interval Since Last Radiation:  The patient completed radiation treatment for his esophageal cancer on 08/09/2013. The patient also has a history of oral tongue squamous cell carcinoma.   Narrative:  The patient returns today for routine follow-up.  The patient indicates that he is doing relatively well. The patient had some difficulties recently with his jejunostomy tube not functioning. Medical oncology was able to successfully flush this and he has resumed using this. He also does continue to swallow some liquids by mouth. The patient overall feels that his overall status has been improving. Somewhat increased energy and his weight has been trending up. No complaints of nausea at this time.  The patient underwent recent restaging CT scan at [facility]. He is scheduled to be seen day or by the surgeon for evaluation for possible resection tomorrow.                              ALLERGIES:  is allergic to penicillins.  Meds: Current Outpatient Prescriptions  Medication Sig Dispense Refill  . acetaminophen (TYLENOL) 160 MG/5ML solution Take 20.3 mLs (650 mg total) by mouth every 4 (four) hours as needed for mild pain.  120 mL  0  . antiseptic oral rinse (BIOTENE) LIQD 15 mLs by Mouth Rinse route 2 times daily at 12 noon and 4 pm.  1 Bottle  3  . famotidine (PEPCID) 40 MG/5ML suspension Place 5 mLs (40 mg total) into feeding tube 2 (two) times daily.  300 mL  0  . HYDROcodone-acetaminophen (HYCET) 7.5-325 mg/15 ml solution Take 10 mLs by mouth 4 (four) times daily as needed for moderate pain.  473 mL  0  . lidocaine-prilocaine (EMLA) cream Apply to port a cath site one hour prior to use. Do not rub in. Cover with plastic.  30 g  2  . magnesium  hydroxide (MILK OF MAGNESIA) 400 MG/5ML suspension Place 30 mLs into feeding tube daily as needed for mild constipation or moderate constipation (okay to use J tube for this medication).  360 mL  0  . Naphazoline HCl (CLEAR EYES OP) Place 2 drops into both eyes daily as needed (For dry or irritated eyes.).      Marland Kitchen nicotine (NICODERM CQ - DOSED IN MG/24 HOURS) 14 mg/24hr patch Place 1 patch (14 mg total) onto the skin daily.  28 patch  0  . Nutritional Supplements (FEEDING SUPPLEMENT, OSMOLITE 1.5 CAL,) LIQD Place 1,000 mLs into feeding tube continuous.  1000 mL  5  . Nutritional Supplements (OSMOLITE) LIQD Take 237 mLs by mouth See admin instructions. He drinks 3-4 cans per day.  10 Can  5  . ondansetron (ZOFRAN) 4 MG tablet Place 1 tablet (4 mg total) into feeding tube every 6 (six) hours as needed for nausea or vomiting.  20 tablet  0  . polyethylene glycol (MIRALAX / GLYCOLAX) packet Take 17 g by mouth daily as needed for moderate constipation (via peg).      . promethazine (PHENERGAN) 6.25 MG/5ML syrup Place 10 mLs (12.5 mg total) into feeding tube every 4 (four) hours as needed for nausea (Call if nausea not controlled.).  120 mL  0  .  Water For Irrigation, Sterile (FREE WATER) SOLN Place 150 mLs into feeding tube every 4 (four) hours.  1000 mL  5   No current facility-administered medications for this encounter.    Physical Findings: The patient is in no acute distress. Patient is alert and oriented.  height is 5\' 6"  (1.676 m) and weight is 104 lb 12.8 oz (47.537 kg). His axillary temperature is 97.6 F (36.4 C). His blood pressure is 107/81 and his pulse is 130. His respiration is 20 and oxygen saturation is 100%. .   General: Well-developed, in no acute distress HEENT: Normocephalic, atraumatic Cardiovascular: Regular rate and rhythm Respiratory: Clear to auscultation bilaterally GI: Soft, nontender, normal bowel sounds Extremities: No edema present   Lab Findings: Lab Results    Component Value Date   WBC 5.2 09/02/2013   HGB 10.6* 09/02/2013   HCT 33.8* 09/02/2013   MCV 90.9 09/02/2013   PLT 407* 09/02/2013     Radiographic Findings: No results found.  Impression:    The patient has been recovering from chemoradiotherapy, neoadjuvant, the last month. He has done satisfactorily. He has also been seen recently by nutrition. Continues to use his jejunostomy tube. He is being seen by surgery at [facility] tomorrow for discussion of possible resection. We are attempting to obtain the results of his recent CT imaging at [place]:  The patient will followup with Dr. Isidore Moos in 3 months for ongoing followup.   Jodelle Gross, M.D., Ph.D.

## 2013-09-16 ENCOUNTER — Telehealth: Payer: Self-pay | Admitting: Nutrition

## 2013-09-16 NOTE — Telephone Encounter (Signed)
I called patient and spoke briefly with him on the telephone today.  He is tolerating Osmolite 1.5 via jejunostomy providing 1800 calories, 75 g protein, and 1814 mL free water.  This is 100% of estimated needs.  He is drinking some water and has purchased Ensure Plus to take by mouth.  Weight was documented as 104.8 pounds on January 22.  His weight does fluctuate several pounds in both directions.  Last weight documented on December 4, was 108 pounds.  Encouraged patient to continue present tube feeding and recommended patient begin oral intake as tolerated/allowed to provide additional calories and protein to promote weight gain.  Questions were answered.  Recommended patient call me if he has any further needs.  Patient is agreeable.

## 2013-09-22 ENCOUNTER — Other Ambulatory Visit: Payer: Self-pay | Admitting: Radiation Oncology

## 2013-09-22 DIAGNOSIS — C029 Malignant neoplasm of tongue, unspecified: Secondary | ICD-10-CM

## 2013-09-22 MED ORDER — HYDROCODONE-ACETAMINOPHEN 7.5-325 MG/15ML PO SOLN: 10.0000 mL | Freq: Four times a day (QID) | ORAL | Status: DC | PRN

## 2013-09-30 ENCOUNTER — Other Ambulatory Visit: Payer: Self-pay | Admitting: Radiation Oncology

## 2013-09-30 DIAGNOSIS — C155 Malignant neoplasm of lower third of esophagus: Secondary | ICD-10-CM

## 2013-09-30 DIAGNOSIS — C029 Malignant neoplasm of tongue, unspecified: Secondary | ICD-10-CM

## 2013-09-30 MED ORDER — HYDROCODONE-ACETAMINOPHEN 7.5-325 MG/15ML PO SOLN: 10.0000 mL | Freq: Four times a day (QID) | ORAL | Status: DC | PRN

## 2013-10-03 ENCOUNTER — Ambulatory Visit (HOSPITAL_BASED_OUTPATIENT_CLINIC_OR_DEPARTMENT_OTHER): Payer: Medicaid Other | Admitting: Nurse Practitioner

## 2013-10-03 ENCOUNTER — Telehealth: Payer: Self-pay | Admitting: Oncology

## 2013-10-03 VITALS — BP 117/82 | HR 130 | Temp 97.9°F | Resp 21 | Ht 66.0 in | Wt 105.0 lb

## 2013-10-03 DIAGNOSIS — C029 Malignant neoplasm of tongue, unspecified: Secondary | ICD-10-CM

## 2013-10-03 DIAGNOSIS — C155 Malignant neoplasm of lower third of esophagus: Secondary | ICD-10-CM

## 2013-10-03 DIAGNOSIS — F1021 Alcohol dependence, in remission: Secondary | ICD-10-CM

## 2013-10-03 DIAGNOSIS — R911 Solitary pulmonary nodule: Secondary | ICD-10-CM

## 2013-10-03 DIAGNOSIS — F17200 Nicotine dependence, unspecified, uncomplicated: Secondary | ICD-10-CM

## 2013-10-03 DIAGNOSIS — R1310 Dysphagia, unspecified: Secondary | ICD-10-CM

## 2013-10-03 NOTE — Progress Notes (Signed)
OFFICE PROGRESS NOTE  Interval history:   Henry Leonard returns for followup of esophagus cancer. Jejunostomy tube is functioning normally. He is taking 4-5 cans of a nutritional supplement per day as well as free water. He is taking some water by mouth. He denies nausea/vomiting. Bowels are moving. He continues to note a "dry mouth". He has occasional heartburn. He has had recent sinus/nasal congestion.   Objective: Filed Vitals:   10/03/13 1211  BP: 117/82  Pulse: 130  Temp: 97.9 F (36.6 C)  Resp: 21   Mouth was dry. No thrush. Lungs are clear. No wheezes or rales. Regular cardiac rhythm. Port-A-Cath site without erythema. Abdomen soft and nontender. No hepatomegaly. Jejunostomy tube site is without erythema. No leg edema.   Lab Results: Lab Results  Component Value Date   WBC 5.2 09/02/2013   HGB 10.6* 09/02/2013   HCT 33.8* 09/02/2013   MCV 90.9 09/02/2013   PLT 407* 09/02/2013   NEUTROABS 4.0 09/02/2013    Chemistry:    Chemistry      Component Value Date/Time   NA 132* 08/04/2013 0946   NA 130* 07/01/2013 0600   K 4.5 08/04/2013 0946   K 3.6 07/01/2013 0600   CL 95* 07/01/2013 0600   CO2 25 08/04/2013 0946   CO2 28 07/01/2013 0600   BUN 16.1 08/04/2013 0946   BUN 6 07/01/2013 0600   CREATININE 0.6* 08/04/2013 0946   CREATININE 0.31* 07/01/2013 0600      Component Value Date/Time   CALCIUM 10.2 08/04/2013 0946   CALCIUM 8.6 07/01/2013 0600   ALKPHOS 60 07/06/2013 1054   ALKPHOS 58 12/04/2011 1505   AST 13 07/06/2013 1054   AST 14 12/04/2011 1505   ALT 10 07/06/2013 1054   ALT 8 12/04/2011 1505   BILITOT <0.20 07/06/2013 1054   BILITOT 0.2* 12/04/2011 1505       Studies/Results: No results found.  Medications: I have reviewed the patient's current medications.  Assessment/Plan: 1. 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  Initiation of concurrent radiation and weekly Taxol/carboplatin on 06/22/2013.  Week 2 on 07/06/2013, allergic reaction early in the Taxol infusion, he received carboplatin on only  Week 3 on 07/13/2013, no allergic reaction after Decadron/Solu-Medrol premedication.  Week 4 on 07/20/2013.  Week 5 on 08/04/2013  Radiation completed 08/09/2013 Seen by Dr. Clovis Riley 09/09/2013 (marginal candidate for a resection in light of his substantial medical comorbidities and prior stage IV tongue cancer). Suggested reevaluation for surgery only if symptoms recur and performance status improves with continued control of the tongue cancer. 2. Spiculated left lower lobe nodule with low level FDG activity on the staging PET scan 05/26/2013  3. Tongue cancer 2013,ypT2N2cM0, status post Erlotonib, glossectomy/neck dissection, and adjuvant cisplatin/radiation in 2013  4. Ongoing tobacco use  5. History of alcohol use  6. Solid/liquid dysphagia secondary to #1.  7. Status post placement of a surgical J-tube 06/28/2013.  8. Hospitalization 06/27/2013 through 07/03/2013 with dehydration, solid/liquid dysphagia. A J-tube was placed by surgery.  9. Nutrition via J-tube.  10. Allergic reaction to Taxol on 07/06/2013-no reaction with treatment 07/13/2013 after home Decadron and pre-Taxol Solu-Medrol.   Dispositon-he appears stable. Dr. Clovis Riley does not recommend surgery at this time. We will follow on an observation approach. He will return for a Port-A-Cath flush next week and an office  visit and port flush in 6 weeks. He will contact the office in the interim with any problems.  Plan reviewed with Dr. [name].   Ned Card ANP/GNP-BC

## 2013-10-03 NOTE — Telephone Encounter (Signed)
Gave pt appt for MD and flush for April 2015

## 2013-10-07 ENCOUNTER — Encounter: Payer: Self-pay | Admitting: *Deleted

## 2013-10-07 ENCOUNTER — Other Ambulatory Visit: Payer: Self-pay | Admitting: Radiation Oncology

## 2013-10-07 DIAGNOSIS — C029 Malignant neoplasm of tongue, unspecified: Secondary | ICD-10-CM

## 2013-10-07 DIAGNOSIS — C155 Malignant neoplasm of lower third of esophagus: Secondary | ICD-10-CM

## 2013-10-07 MED ORDER — HYDROCODONE-ACETAMINOPHEN 7.5-325 MG/15ML PO SOLN: 10.0000 mL | Freq: Four times a day (QID) | ORAL | Status: DC | PRN

## 2013-10-07 NOTE — Progress Notes (Signed)
rx for hycet  written again by Dr.moody, but informed patient that this is too early to get filled, rx should last 12 days, last written by Dr.Squire on 09/30/13, informed patient he needs to start weaning himself off this pain med as his last treatment was 59 day ago, will make follow up for 2 weeks with Dr.Moody,patient agreeed 3:43 PM

## 2013-10-10 ENCOUNTER — Telehealth: Payer: Self-pay | Admitting: *Deleted

## 2013-10-10 NOTE — Telephone Encounter (Signed)
Patient called and left message to reschedule his flush appt. I have called him back and moved from today to Wednesday. Patient aware

## 2013-10-12 ENCOUNTER — Ambulatory Visit (HOSPITAL_BASED_OUTPATIENT_CLINIC_OR_DEPARTMENT_OTHER): Payer: Medicaid Other

## 2013-10-12 VITALS — BP 102/68 | HR 106 | Temp 97.8°F

## 2013-10-12 DIAGNOSIS — Z95828 Presence of other vascular implants and grafts: Secondary | ICD-10-CM

## 2013-10-12 DIAGNOSIS — Z452 Encounter for adjustment and management of vascular access device: Secondary | ICD-10-CM

## 2013-10-12 DIAGNOSIS — C155 Malignant neoplasm of lower third of esophagus: Secondary | ICD-10-CM

## 2013-10-12 MED ORDER — HEPARIN SOD (PORK) LOCK FLUSH 100 UNIT/ML IV SOLN
500.0000 [IU] | Freq: Once | INTRAVENOUS | Status: AC
  Administered 2013-10-12: 500 [IU] via INTRAVENOUS
  Filled 2013-10-12: qty 5

## 2013-10-12 MED ORDER — SODIUM CHLORIDE 0.9 % IJ SOLN
10.0000 mL | INTRAMUSCULAR | Status: DC | PRN
  Administered 2013-10-12: 10 mL via INTRAVENOUS
  Filled 2013-10-12: qty 10

## 2013-10-12 NOTE — Patient Instructions (Signed)

## 2013-10-20 ENCOUNTER — Other Ambulatory Visit: Payer: Self-pay | Admitting: *Deleted

## 2013-10-20 ENCOUNTER — Encounter: Payer: Self-pay | Admitting: Radiation Oncology

## 2013-10-20 DIAGNOSIS — C023 Malignant neoplasm of anterior two-thirds of tongue, part unspecified: Secondary | ICD-10-CM

## 2013-10-21 ENCOUNTER — Encounter: Payer: Self-pay | Admitting: Radiation Oncology

## 2013-10-21 ENCOUNTER — Ambulatory Visit
Admission: RE | Admit: 2013-10-21 | Discharge: 2013-10-21 | Disposition: A | Discharge status: Home / Self Care | Payer: Medicaid Other | Source: Ambulatory Visit | Attending: Radiation Oncology | Admitting: Radiation Oncology

## 2013-10-21 ENCOUNTER — Other Ambulatory Visit: Payer: Self-pay | Admitting: Radiation Oncology

## 2013-10-21 ENCOUNTER — Ambulatory Visit (HOSPITAL_COMMUNITY)
Admission: RE | Admit: 2013-10-21 | Discharge: 2013-10-21 | Disposition: A | Discharge status: Home / Self Care | Payer: Medicaid Other | Source: Ambulatory Visit | Attending: Radiation Oncology | Admitting: Radiation Oncology

## 2013-10-21 VITALS — BP 99/70 | HR 127 | Temp 98.5°F | Ht 66.0 in | Wt 102.8 lb

## 2013-10-21 DIAGNOSIS — C029 Malignant neoplasm of tongue, unspecified: Secondary | ICD-10-CM

## 2013-10-21 DIAGNOSIS — C159 Malignant neoplasm of esophagus, unspecified: Secondary | ICD-10-CM | POA: Insufficient documentation

## 2013-10-21 DIAGNOSIS — C155 Malignant neoplasm of lower third of esophagus: Secondary | ICD-10-CM

## 2013-10-21 DIAGNOSIS — Z434 Encounter for attention to other artificial openings of digestive tract: Secondary | ICD-10-CM | POA: Insufficient documentation

## 2013-10-21 DIAGNOSIS — C023 Malignant neoplasm of anterior two-thirds of tongue, part unspecified: Secondary | ICD-10-CM

## 2013-10-21 HISTORY — DX: Encounter for other specified aftercare: Z51.89

## 2013-10-21 MED ORDER — DIPHENHYDRAMINE HCL 12.5 MG/5ML PO SYRP: ORAL_SOLUTION | ORAL | Status: DC

## 2013-10-21 MED ORDER — RANITIDINE HCL 150 MG/10ML PO SYRP: 150.0000 mg | ORAL_SOLUTION | Freq: Two times a day (BID) | ORAL | Status: DC

## 2013-10-21 MED ORDER — HYDROCODONE-ACETAMINOPHEN 7.5-325 MG/15ML PO SOLN: 5.0000 mL | Freq: Four times a day (QID) | ORAL | Status: DC | PRN

## 2013-10-21 MED ORDER — IOHEXOL 300 MG/ML  SOLN
25.0000 mL | Freq: Once | INTRAMUSCULAR | Status: AC | PRN
  Administered 2013-10-21: 10 mL

## 2013-10-21 NOTE — Patient Instructions (Signed)
Zantac (Ranitidine) is to be taken twice a day to prevent heartburn.  Benadryl (Diphenhydramine) is to be taken at bedtime as needed on nights you have trouble sleeping.  Please taper Hycet to that you are eventually not taking it.  Try 49mL no more than 4 times/ day, and use Biotene gel for dry mouth, and the benadryl for the insomnia.

## 2013-10-21 NOTE — Progress Notes (Signed)
Mr. Henry Leonard here for reassessment following radiation therapy for squamous cell cancer of the tongue.  He denies any pain, but c/o constant dry mouth.  Upon inspection, noe dryness of tongue and oral cavity, but mucosa is intact.  He arrived from interventional radiology after having replacement of his jeunostomy feeding tube, as ordered by Dr. Valere Dross on yesterday, since it was dislodged by accident. Site clear and a dressing applied.  Note Hypertension with elevated pulse as seen with standing and sitting vitals as documented.

## 2013-10-21 NOTE — Progress Notes (Signed)
Radiation Oncology         [phone] ________________________________  Name: Henry Leonard MRN: [id number]  Date: 10/21/2013  DOB: 11-Jan-1956  Follow-Up Visit Note  Outpatient  CC: DEVINE, ALMA, MD    Diagnosis and Prior Radiotherapy:  T3N2M0 well differentiated squamous cell carcinoma of the distal esophagus Prior history of T2N2cM0 Squamous Cell Carcinoma of the Tongue  Site/dose: The patient was treated using a 3-D conformal technique to the esophageal tumor and surrounding high-risk regions. Initially he was treated with a 4 field technique to a dose of 45 gray. The patient then received a 5.4 gray boost with a 4 field cone down technique. The patient's total dose was 50.4 gray. Daily image guidance was used for his treatment. RT given with carboplatin and Taxol; Completed 08/09/2013  For his tongue cancer, he was treated at [facility] with neoadjuvant erlotinib --> surgery --> ChRT with Cisplatin completed 04-06-12.  Narrative:  The patient returns today for routine follow-up.    CT CAP at [facility] on 1-15 showed  Mass at the distal esophagus and proximal stomach is decreased in size, and the distal esophagus now measures 2.7 x 2.4 cm (series 2, image 80), and before had measured 3.9 x 4.7 cm; Decrease in size of gastrohepatic lymph node which now measures 12 x 10 mm (series 3, image 348), which before had measured 24 x 11 mm. I do not see any comment regarding new sites of metastatic disease.  He was seen in Dr Georgina Snell clinic on 09-09-13.  He was felt to be "a very marginal candidate for a resection in light of his substantial medical co-morbidities and prior stage IV tongue cancer. We suggested re-evaluation for surgery only if symptoms recur and his performance status improves with continuted control of the tongue cancer."  He arrived from interventional radiology after having replacement of his jeunostomy feeding tube, as ordered by Dr. Valere Dross on yesterday, since it was  dislodged by accident  Sees Dr [name] on in April 2015.  Continues to have dry mouth, heartburn, insomnia. Taking Hycet for the heartburn pain, but also b/c it helps "me sleep and helps my dry mouth."  Continues to follow with Dr Melven Sartorius who is CT scanning his neck only in light of his tongue cancer history.        TSH was normal at his visit with her in Oct.              ALLERGIES:  is allergic to penicillins.  Meds: Current Outpatient Prescriptions  Medication Sig Dispense Refill  . lidocaine-prilocaine (EMLA) cream Apply to port a cath site one hour prior to use. Do not rub in. Cover with plastic.  30 g  2  . magnesium hydroxide (MILK OF MAGNESIA) 400 MG/5ML suspension Place 30 mLs into feeding tube daily as needed for mild constipation or moderate constipation (okay to use J tube for this medication).  360 mL  0  . Naphazoline HCl (CLEAR EYES OP) Place 2 drops into both eyes daily as needed (For dry or irritated eyes.).      Marland Kitchen Nutritional Supplements (OSMOLITE) LIQD Take 237 mLs by mouth See admin instructions. He drinks 3-4 cans per day.  10 Can  5  . ondansetron (ZOFRAN) 4 MG tablet Place 1 tablet (4 mg total) into feeding tube every 6 (six) hours as needed for nausea or vomiting.  20 tablet  0  . polyethylene glycol (MIRALAX / GLYCOLAX) packet Take 17 g by mouth daily  as needed for moderate constipation (via peg).      Marland Kitchen acetaminophen (TYLENOL) 160 MG/5ML solution Take 20.3 mLs (650 mg total) by mouth every 4 (four) hours as needed for mild pain.  120 mL  0  . diphenhydrAMINE (BENYLIN) 12.5 MG/5ML syrup Take 26mL at bedtime PRN insomnia.  237 mL  5  . HYDROcodone-acetaminophen (HYCET) 7.5-325 mg/15 ml solution Take 5 mLs by mouth 4 (four) times daily as needed for moderate pain.  240 mL  0  . nicotine (NICODERM CQ - DOSED IN MG/24 HOURS) 14 mg/24hr patch Place 1 patch (14 mg total) onto the skin daily.  28 patch  0  . ranitidine (ZANTAC) 150 MG/10ML syrup Take 10 mLs (150 mg total)  by mouth 2 (two) times daily. To prevent acid reflux/ heartburn.  480 mL  5   No current facility-administered medications for this encounter.    Physical Findings: The patient is in no acute distress. Patient is alert and oriented.  height is 5\' 6"  (1.676 m) and weight is 102 lb 12.8 oz (46.63 kg). His temperature is 98.5 F (36.9 C). His blood pressure is 99/70 and his pulse is 127. Marland Kitchen    No sign of recurrence in mouth/oropharynx.  Dry mucosa, postsurgical tongue changes.  Neck - edema but not palpable nodes. Chest CTAB, Heart tachycardic and regular rhythm.  Abdomen, normoactive bowel sounds - J Tube in place.   Lab Findings: Lab Results  Component Value Date   WBC 5.2 09/02/2013   HGB 10.6* 09/02/2013   HCT 33.8* 09/02/2013   MCV 90.9 09/02/2013   PLT 407* 09/02/2013    Radiographic Findings: No results found.  Impression/Plan:  Recovering from ChRT for esophageal cancer, no plans for surgery. Prior tongue cancer treated at [facility].  1) Offered Referral to PT for neck edema, but patient cannot go due to insurance limiting visits with them. He has seen them in past.  2) I adjusted his medications with instructions as below.  3) He will see Dr. [name] in April me in June. I will write a note to Dr. [name] in case he wants to order imaging for April [facility] is only CT scanning his neck from this point onward.)  Patient Instructions: Zantac (Ranitidine) is to be taken twice a day to prevent heartburn.  Benadryl (Diphenhydramine) is to be taken at bedtime as needed on nights you have trouble sleeping.  Please taper Hycet to that you are eventually not taking it.  Try 35mL no more than 4 times/ day, and use Biotene gel for dry mouth, and the benadryl for the insomnia.  I spent 30 minutes face to face with the patient and more than 50% of that time was spent in counseling and/or coordination of care. _____________________________________   Eppie Gibson, MD

## 2013-10-21 NOTE — Progress Notes (Deleted)
Mr. Henry Leonard here today s/p radiation to his glottic region.  He is traveling by wheelchair and is unsteady when he ambulates.  He has recently suffered a fall, but was uninjured.     He denies any pain or difficulty swallowing and is eating "whatever he wants..  His oral mucosa is moist and intact.   He is no longer in Rehab and is at home.  He has a suprapbic catheter and c/o intermitttent bladder spasms.

## 2013-10-28 ENCOUNTER — Telehealth: Payer: Self-pay | Admitting: *Deleted

## 2013-10-28 NOTE — Telephone Encounter (Signed)
Mr/ Corinna Capra called today requesting a refill on his Hycet for which he had already received a refill on 10/21/13 for Hycet with instructions as follows: Take 5 mLs by mouth 4 (four) times daily as needed for moderate pain: Total Volume 240 mls  He stated that by the end of the weekend, that he would be "completely out".   Informed him that, at this time, his prescription could not be refilled because of non compliance with the original prescrition, but Dr. Isidore Moos would be notified on Monday.

## 2013-10-31 ENCOUNTER — Telehealth: Payer: Self-pay | Admitting: *Deleted

## 2013-10-31 ENCOUNTER — Other Ambulatory Visit: Payer: Self-pay | Admitting: Radiation Oncology

## 2013-10-31 DIAGNOSIS — C155 Malignant neoplasm of lower third of esophagus: Secondary | ICD-10-CM

## 2013-10-31 DIAGNOSIS — C029 Malignant neoplasm of tongue, unspecified: Secondary | ICD-10-CM

## 2013-10-31 MED ORDER — HYDROCODONE-ACETAMINOPHEN 7.5-325 MG/15ML PO SOLN: 5.0000 mL | Freq: Four times a day (QID) | ORAL | Status: DC | PRN

## 2013-10-31 NOTE — Telephone Encounter (Signed)
CALLED PATIENT TO INFORM THAT SCRIPT IS READY FOR PICK-UP, SPOKE WITH PATIENT AND HE IS AWARE OF THIS.

## 2013-11-09 ENCOUNTER — Other Ambulatory Visit: Payer: Self-pay | Admitting: Radiation Oncology

## 2013-11-09 ENCOUNTER — Telehealth: Payer: Self-pay | Admitting: *Deleted

## 2013-11-09 DIAGNOSIS — C155 Malignant neoplasm of lower third of esophagus: Secondary | ICD-10-CM

## 2013-11-09 DIAGNOSIS — C029 Malignant neoplasm of tongue, unspecified: Secondary | ICD-10-CM

## 2013-11-09 MED ORDER — HYDROCODONE-ACETAMINOPHEN 7.5-325 MG/15ML PO SOLN: 5.0000 mL | Freq: Four times a day (QID) | ORAL | Status: DC | PRN

## 2013-11-09 NOTE — Telephone Encounter (Signed)
Mr. Duer called and informed that a refill script for his Hycet is waiting for pick-up in Radiation Oncology nursing.  He stated he would pick this up on tomorrow.

## 2013-11-10 ENCOUNTER — Telehealth: Payer: Self-pay | Admitting: *Deleted

## 2013-11-10 NOTE — Telephone Encounter (Signed)
CALLED PATIENT TO INFORM THAT SCRIPT IS READY FOR PICK-UP, SPOKE WITH PATIENT AND HE IS AWARE OF THIS.

## 2013-11-18 ENCOUNTER — Encounter: Payer: Self-pay | Admitting: *Deleted

## 2013-11-18 ENCOUNTER — Other Ambulatory Visit (HOSPITAL_COMMUNITY): Payer: Self-pay | Admitting: Interventional Radiology

## 2013-11-18 ENCOUNTER — Ambulatory Visit (HOSPITAL_COMMUNITY)
Admission: RE | Admit: 2013-11-18 | Discharge: 2013-11-18 | Disposition: A | Discharge status: Home / Self Care | Payer: Medicaid Other | Source: Ambulatory Visit | Attending: Interventional Radiology | Admitting: Interventional Radiology

## 2013-11-18 ENCOUNTER — Other Ambulatory Visit: Payer: Self-pay | Admitting: Radiation Oncology

## 2013-11-18 DIAGNOSIS — R633 Feeding difficulties, unspecified: Secondary | ICD-10-CM

## 2013-11-18 DIAGNOSIS — C155 Malignant neoplasm of lower third of esophagus: Secondary | ICD-10-CM

## 2013-11-18 DIAGNOSIS — Z434 Encounter for attention to other artificial openings of digestive tract: Secondary | ICD-10-CM | POA: Insufficient documentation

## 2013-11-18 DIAGNOSIS — C029 Malignant neoplasm of tongue, unspecified: Secondary | ICD-10-CM

## 2013-11-18 MED ORDER — LIDOCAINE HCL 1 % IJ SOLN
INTRAMUSCULAR | Status: AC
  Filled 2013-11-18: qty 20

## 2013-11-18 MED ORDER — IOHEXOL 300 MG/ML  SOLN
10.0000 mL | Freq: Once | INTRAMUSCULAR | Status: AC | PRN
  Administered 2013-11-18: 7 mL

## 2013-11-18 MED ORDER — HYDROCODONE-ACETAMINOPHEN 7.5-325 MG/15ML PO SOLN: 5.0000 mL | Freq: Four times a day (QID) | ORAL | Status: DC | PRN

## 2013-11-18 NOTE — Progress Notes (Signed)
Henry Leonard called this am with report that his jeunostomy tube was "completely" dislodged from his abodomen.  He reported that he "stuck it back in, then flushed it with water without any pain nor difficulty."  Instructed him that he should not infuse any liquids via the tube until it is check by radiology.  Called Radiology and he will be seen at 1530, with instructions to arrive at 1500.  He stated compliance.  He is requesting a refill on his Hycet and Dr. Isidore Moos will be informed.  He next refill is scheduled for 11/21/13.

## 2013-11-22 ENCOUNTER — Ambulatory Visit (HOSPITAL_BASED_OUTPATIENT_CLINIC_OR_DEPARTMENT_OTHER): Payer: Medicaid Other

## 2013-11-22 ENCOUNTER — Telehealth: Payer: Self-pay | Admitting: Oncology

## 2013-11-22 ENCOUNTER — Ambulatory Visit (HOSPITAL_BASED_OUTPATIENT_CLINIC_OR_DEPARTMENT_OTHER): Payer: Medicaid Other | Admitting: Oncology

## 2013-11-22 VITALS — BP 121/77 | HR 106 | Temp 97.5°F | Resp 18 | Ht 66.0 in | Wt 102.7 lb

## 2013-11-22 DIAGNOSIS — Z95828 Presence of other vascular implants and grafts: Secondary | ICD-10-CM

## 2013-11-22 DIAGNOSIS — Z85810 Personal history of malignant neoplasm of tongue: Secondary | ICD-10-CM

## 2013-11-22 DIAGNOSIS — Z934 Other artificial openings of gastrointestinal tract status: Secondary | ICD-10-CM

## 2013-11-22 DIAGNOSIS — F17200 Nicotine dependence, unspecified, uncomplicated: Secondary | ICD-10-CM

## 2013-11-22 DIAGNOSIS — R1310 Dysphagia, unspecified: Secondary | ICD-10-CM

## 2013-11-22 DIAGNOSIS — C155 Malignant neoplasm of lower third of esophagus: Secondary | ICD-10-CM

## 2013-11-22 DIAGNOSIS — R911 Solitary pulmonary nodule: Secondary | ICD-10-CM

## 2013-11-22 DIAGNOSIS — Z452 Encounter for adjustment and management of vascular access device: Secondary | ICD-10-CM

## 2013-11-22 MED ORDER — HEPARIN SOD (PORK) LOCK FLUSH 100 UNIT/ML IV SOLN
500.0000 [IU] | Freq: Once | INTRAVENOUS | Status: AC
  Administered 2013-11-22: 500 [IU] via INTRAVENOUS
  Filled 2013-11-22: qty 5

## 2013-11-22 MED ORDER — SODIUM CHLORIDE 0.9 % IJ SOLN
10.0000 mL | INTRAMUSCULAR | Status: DC | PRN
  Administered 2013-11-22: 10 mL via INTRAVENOUS
  Filled 2013-11-22: qty 10

## 2013-11-22 NOTE — Progress Notes (Signed)
  [place] OFFICE PROGRESS NOTE   Diagnosis: Esophagus cancer  INTERVAL HISTORY:   He returns as scheduled. He continues to have intermittent dysphagia. Intermittent "heartburn ". He reports the feeding tube fell out last week and was replaced in radiology. He continues tube feedings. A restaging CT of the chest at [facility] 09/01/2013, compared to 05/26/2013 revealed a decrease in the size of the distal esophagus/proximal gastric mass. Stable left upper lobe spiculated nodule. Decreased size of gastrohepatic lymph node.  Objective:  Vital signs in last 24 hours:  Blood pressure 121/77, pulse 106, temperature 97.5 F (36.4 C), temperature source Oral, resp. rate 18, height 5\' 6"  (1.676 m), weight 102 lb 11.2 oz (46.584 kg), SpO2 100.00%.    HEENT: Partial glossectomy, postradiation and surgical changes over the neck Lymphatics: No cervical or supraclavicular nodes. "Shotty "bilateral axillary nodes Resp: Lungs clear bilaterally, distant breath sounds Cardio: Regular rate and rhythm GI: No hepatomegaly, nontender, no mass. Feeding tube site at the left upper abdomen without evidence of infection Vascular: No leg edema  Portacath/PICC-without erythema   Medications: I have reviewed the patient's current medications.  Assessment/Plan: 1. 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  Initiation of concurrent radiation and weekly Taxol/carboplatin on 06/22/2013.  Week 2 on 07/06/2013, allergic reaction early in the Taxol infusion, he received carboplatin on only  Week 3 on 07/13/2013, no allergic reaction after Decadron/Solu-Medrol premedication.  Week 4 on 07/20/2013.  Week 5 on 08/04/2013  Radiation completed 08/09/2013  Seen by Dr. Clovis Riley 09/09/2013  (marginal candidate for a resection in light of his substantial medical comorbidities and prior stage IV tongue cancer). Suggested reevaluation for surgery only if symptoms recur and performance status improves with continued control of the tongue cancer. 2. Spiculated left upper lobe nodule with low level FDG activity on the staging PET scan 05/26/2013 , stable on a chest CT 09/01/2013 3. Tongue cancer 2013,ypT2N2cM0, status post Erlotonib, glossectomy/neck dissection, and adjuvant cisplatin/radiation in 2013  4. Ongoing tobacco use  5. History of alcohol use  6. Solid/liquid dysphagia secondary to #1.  7. Status post placement of a surgical J-tube 06/28/2013.  8. Hospitalization 06/27/2013 through 07/03/2013 with dehydration, solid/liquid dysphagia. A J-tube was placed by surgery.    Disposition:  There is no clinical evidence for progression of the esophagus cancer. He is scheduled to undergo a restaging CT evaluation at [facility] next week. He will continue jejunostomy tube feedings. Mr. Karwowski will return for an office visit and Port-A-Cath flush in 6 weeks. We will check a barium swallow and make a GI referral if the dysphagia persist. 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  Betsy Coder, MD  11/22/2013  12:35 PM

## 2013-11-22 NOTE — Telephone Encounter (Signed)
gv adn printed aptps ched and avs for pt for May

## 2013-11-22 NOTE — Patient Instructions (Signed)

## 2013-11-29 ENCOUNTER — Telehealth: Payer: Self-pay | Admitting: *Deleted

## 2013-11-29 ENCOUNTER — Other Ambulatory Visit: Payer: Self-pay | Admitting: Radiation Oncology

## 2013-11-29 ENCOUNTER — Encounter: Payer: Self-pay | Admitting: *Deleted

## 2013-11-29 DIAGNOSIS — C029 Malignant neoplasm of tongue, unspecified: Secondary | ICD-10-CM

## 2013-11-29 MED ORDER — HYDROCODONE-ACETAMINOPHEN 7.5-325 MG/15ML PO SOLN: 5.0000 mL | Freq: Four times a day (QID) | ORAL | Status: DC | PRN

## 2013-11-29 NOTE — Telephone Encounter (Signed)
Pt called requesting re-fill of Hycet.  Per Dr. [name] notified Dr. Isidore Moos RN Patric Dykes of request.

## 2013-11-29 NOTE — Progress Notes (Signed)
Mr. Henry Leonard in today for a refill on his Hycet.  According to the script he should have 20 mls or 1 day left of his present prescription.  Dr. Valere Dross aware of this and refilled the script on 11/30/13.  Informed Mr. Henry Leonard that he may not be able to refill the script immediately since he has not followed the guidelines for taking his medication.  He stated acceptance.

## 2013-12-02 ENCOUNTER — Ambulatory Visit: Payer: Self-pay | Admitting: Radiation Oncology

## 2013-12-06 ENCOUNTER — Telehealth: Payer: Self-pay | Admitting: *Deleted

## 2013-12-06 NOTE — Telephone Encounter (Addendum)
Returned call from Mr. Henry Leonard.  Gave him Advanced directive packet on his last visit and now he wants to meet with the social worker for review and to have this formation notarized.  Left message for [name] to please contact Mr. Henry Leonard.  He wants to come tomorrow, if possible, for completion of this paperwork.

## 2013-12-07 ENCOUNTER — Encounter: Payer: Self-pay | Admitting: *Deleted

## 2013-12-07 NOTE — Progress Notes (Signed)
Henry Leonard  Clinical Social Leonard was referred by nurse for assistance with ADRs.  Clinical Social Worker has arranged to meet with pt on Friday, April 23 at 11am to complete ADRs. Pt reports to have transportation at that date and time and has packet as well.     Henry Racer, LCSW Clinical Social Worker Henry Leonard for [place] Wednesday, Thursday and Friday Phone: [phone] Fax: [phone]

## 2013-12-09 ENCOUNTER — Encounter: Payer: Self-pay | Admitting: *Deleted

## 2013-12-09 NOTE — Progress Notes (Signed)
Henry Leonard  Clinical Social Leonard was referred by pt  to review and complete healthcare advance directives.  Clinical Social Worker met with patient in [place] office.  The patient designated his sister, Henry Leonard as their primary healthcare agent and his brother, Henry Leonard as their secondary agent.  Patient also completed healthcare living will.    Clinical Social Worker notarized documents and made copies for patient/family. Clinical Social Worker will send documents to medical records to be scanned into patient's chart. Clinical Social Worker encouraged patient/family to contact with any additional questions or concerns.  [name], LCSW Clinical Social Worker [place] [place] for [place] Wednesday, Thursday and Friday Phone: [phone] Fax: [phone]

## 2014-01-03 ENCOUNTER — Ambulatory Visit (HOSPITAL_BASED_OUTPATIENT_CLINIC_OR_DEPARTMENT_OTHER): Payer: Medicaid Other | Admitting: Nurse Practitioner

## 2014-01-03 ENCOUNTER — Telehealth: Payer: Self-pay | Admitting: Oncology

## 2014-01-03 VITALS — BP 111/77 | HR 107 | Temp 98.2°F | Resp 18 | Ht 66.0 in | Wt 98.2 lb

## 2014-01-03 DIAGNOSIS — Z95828 Presence of other vascular implants and grafts: Secondary | ICD-10-CM

## 2014-01-03 DIAGNOSIS — C029 Malignant neoplasm of tongue, unspecified: Secondary | ICD-10-CM

## 2014-01-03 DIAGNOSIS — F17200 Nicotine dependence, unspecified, uncomplicated: Secondary | ICD-10-CM

## 2014-01-03 DIAGNOSIS — R911 Solitary pulmonary nodule: Secondary | ICD-10-CM

## 2014-01-03 DIAGNOSIS — C155 Malignant neoplasm of lower third of esophagus: Secondary | ICD-10-CM

## 2014-01-03 DIAGNOSIS — R1310 Dysphagia, unspecified: Secondary | ICD-10-CM

## 2014-01-03 MED ORDER — SODIUM CHLORIDE 0.9 % IJ SOLN
10.0000 mL | INTRAMUSCULAR | Status: DC | PRN
  Administered 2014-01-03: 10 mL via INTRAVENOUS
  Filled 2014-01-03: qty 10

## 2014-01-03 MED ORDER — HEPARIN SOD (PORK) LOCK FLUSH 100 UNIT/ML IV SOLN
500.0000 [IU] | Freq: Once | INTRAVENOUS | Status: AC
  Administered 2014-01-03: 500 [IU] via INTRAVENOUS
  Filled 2014-01-03: qty 5

## 2014-01-03 MED ORDER — HYDROCODONE-ACETAMINOPHEN 7.5-325 MG/15ML PO SOLN: 5.0000 mL | Freq: Three times a day (TID) | ORAL | Status: DC | PRN

## 2014-01-03 NOTE — Progress Notes (Signed)
  [place] OFFICE PROGRESS NOTE   Diagnosis:  Esophagus cancer.  INTERVAL HISTORY:   Henry Leonard returns as scheduled. He continues to have intermittent dysphagia. He attributes the dysphagia to "mucous". He is able to swallow pills and small amounts of water without difficulty. He complains of a dry mouth. Tube feeds remain the main source of nutrition. He denies nausea/vomiting. No fever, cough or shortness of breath. Bowels moving regularly. He has intermittent neck pain. He takes hydrocodone as needed.  Objective:  Vital signs in last 24 hours:  Blood pressure 111/77, pulse 107, temperature 98.2 F (36.8 C), temperature source Axillary, resp. rate 18, height 5\' 6"  (1.676 m), weight 98 lb 3.2 oz (44.543 kg).    HEENT: Partial glossectomy. Radiation and surgical changes over the neck. Lymphatics: No palpable cervical or supraclavicular lymph nodes. Tiny bilateral axillary lymph nodes. Resp: Lungs clear. Cardio: Regular cardiac rhythm. GI: Abdomen soft and nontender. No hepatomegaly. Feeding tube site at the left upper abdomen is without erythema. Vascular: No leg edema.   Port-A-Cath site without erythema.    Lab Results:  Lab Results  Component Value Date   WBC 5.2 09/02/2013   HGB 10.6* 09/02/2013   HCT 33.8* 09/02/2013   MCV 90.9 09/02/2013   PLT 407* 09/02/2013   NEUTROABS 4.0 09/02/2013    Imaging:  No results found.  Medications: I have reviewed the patient's current medications.  Assessment/Plan: 1. 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  Initiation of concurrent radiation and weekly Taxol/carboplatin on 06/22/2013.  Week 2 on 07/06/2013, allergic reaction early in the Taxol infusion, he received carboplatin on only  Week 3 on  07/13/2013, no allergic reaction after Decadron/Solu-Medrol premedication.  Week 4 on 07/20/2013.  Week 5 on 08/04/2013  Radiation completed 08/09/2013  Seen by Dr. Clovis Riley 09/09/2013 (marginal candidate for a resection in light of his substantial medical comorbidities and prior stage IV tongue cancer). Suggested reevaluation for surgery only if symptoms recur and performance status improves with continued control of the tongue cancer. 2. Spiculated left upper lobe nodule with low level FDG activity on the staging PET scan 05/26/2013, stable on a chest CT 09/01/2013.  3. Tongue cancer 2013,ypT2N2cM0, status post Erlotonib, glossectomy/neck dissection, and adjuvant cisplatin/radiation in 2013.   CT neck at [facility] 12/01/2013 with posttreatment changes of resection of the tongue with myocutaneous flap reconstruction, radiation therapy and possible bilateral neck dissection. Stable appearance of the neck with no new enhancing lesions or pathologically enlarged lymph nodes identified. Prominent lymph nodes in the mediastinum which were similar in size and appearance.  4. Ongoing tobacco use.  5. History of alcohol use.  6. Solid/liquid dysphagia secondary to #1.  7. Status post placement of a surgical J-tube 06/28/2013.  8. Hospitalization 06/27/2013 through 07/03/2013 with dehydration, solid/liquid dysphagia. A J-tube was placed by surgery.     Disposition: He appears stable. There is no clinical evidence for progression of the esophagus cancer. Dr. [name] recommends restaging CT scans of the chest and abdomen in approximately 6 weeks.  We will schedule a followup visit several days after the scans to review the results.  A new prescription for hydrocodone/acetaminophen liquid was provided at today's visit.    Owens Shark ANP/GNP-BC   01/03/2014  2:52 PM

## 2014-01-03 NOTE — Telephone Encounter (Signed)
gv adn printed appt sched and avs fo rpt for June and July....pt said he can not drink barium...emailed LT

## 2014-01-03 NOTE — Patient Instructions (Signed)

## 2014-01-11 ENCOUNTER — Telehealth: Payer: Self-pay | Admitting: *Deleted

## 2014-01-11 DIAGNOSIS — C155 Malignant neoplasm of lower third of esophagus: Secondary | ICD-10-CM

## 2014-01-11 DIAGNOSIS — C029 Malignant neoplasm of tongue, unspecified: Secondary | ICD-10-CM

## 2014-01-11 MED ORDER — HYDROCODONE-ACETAMINOPHEN 7.5-325 MG/15ML PO SOLN: 5.0000 mL | Freq: Three times a day (TID) | ORAL | Status: DC | PRN

## 2014-01-11 NOTE — Telephone Encounter (Signed)
Call from pt requesting refill on Hydrocodone elixir. Pt takes 10 mL TID for neck and throat pain. Requesting increased quantity as he doesn't drive and has to get a ride to pick up Rx. Reviewed with Dr. [name]: Order received.

## 2014-01-20 ENCOUNTER — Encounter: Payer: Self-pay | Admitting: Radiation Oncology

## 2014-01-20 ENCOUNTER — Ambulatory Visit
Admission: RE | Admit: 2014-01-20 | Discharge: 2014-01-20 | Disposition: A | Discharge status: Home / Self Care | Payer: Medicaid Other | Source: Ambulatory Visit | Attending: Radiation Oncology | Admitting: Radiation Oncology

## 2014-01-20 VITALS — BP 106/78 | HR 112 | Resp 16 | Wt 95.5 lb

## 2014-01-20 DIAGNOSIS — C155 Malignant neoplasm of lower third of esophagus: Secondary | ICD-10-CM

## 2014-01-20 NOTE — Progress Notes (Signed)
Patient reports that he takes in 3 cans of osmolite per day and occasionally 4. Reports it takes approximately 1 hour and ten minutes for each feeding because he becomes full and bloated easily. Reports his mouth is dry and stick despite using biotene. Reports an occasional cough with thick clear phlegm. Reports GERD continues despite zantac. Reports hycet helps him sleep and relieves discomfort in his lower jaw. Reports he is scheduled for a CAT scan of his abd on June 30. Denies nausea or vomiting. Reports occasional diarrhea but, mostly formed bowel movements. Reports he continues to smoke very little though he knows he shouldn't.

## 2014-01-23 ENCOUNTER — Telehealth: Payer: Self-pay | Admitting: *Deleted

## 2014-01-23 ENCOUNTER — Encounter: Payer: Self-pay | Admitting: Radiation Oncology

## 2014-01-23 NOTE — Progress Notes (Signed)
Radiation Oncology         [phone] ________________________________  Name: Henry Leonard MRN: [id number]  Date: 01/20/2014  DOB: [birth year]  Follow-Up Visit Note  Outpatient  CC: Leisa Lenz, MD  [place], Arnell Sieving Will*  Diagnosis and Prior Radiotherapy:  T3N2M0 well differentiated squamous cell carcinoma of the distal esophagus   Prior history of T2N2cM0 Squamous Cell Carcinoma of the Tongue   Site/dose: The patient was treated using a 3-D conformal technique to the esophageal tumor and surrounding high-risk regions. Initially he was treated with a 4 field technique to a dose of 45 gray. The patient then received a 5.4 gray boost with a 4 field cone down technique. The patient's total dose was 50.4 gray. Daily image guidance was used for his treatment. RT given with carboplatin and Taxol; Completed 08/09/2013   For his tongue cancer, he was treated at [facility] with neoadjuvant erlotinib --> surgery --> ChRT with Cisplatin completed 04-06-12.   Narrative:  The patient returns today for routine follow-up.  He reports that he takes in 3 cans of osmolite per day and occasionally 4. Reports it takes approximately 1 hour and ten minutes for each feeding because he becomes full and bloated easily. Losing weight.   Reports his mouth is dry and sticky despite using biotene. Reports an occasional cough with thick clear phlegm. Reports GERD for which he takes zantac. Reports hycet helps him sleep and relieves discomfort in his lower jaw - recently refilled by med/onc Arvid Right). Reports he is scheduled for a CT scan of his abd on June 30. Denies nausea or vomiting. Reports occasional diarrhea but, mostly formed bowel movements. Reports he continues to smoke very little though he knows he shouldn't.  Continues following with Dr Melven Sartorius at [facility] for his tongue cancer.                             ALLERGIES:  is allergic to penicillins.  Meds: Current Outpatient Prescriptions    Medication Sig Dispense Refill  . acetaminophen (TYLENOL) 160 MG/5ML solution Take 20.3 mLs (650 mg total) by mouth every 4 (four) hours as needed for mild pain.  120 mL  0  . diphenhydrAMINE (BENYLIN) 12.5 MG/5ML syrup Take 69mL at bedtime PRN insomnia.  237 mL  5  . HYDROcodone-acetaminophen (HYCET) 7.5-325 mg/15 ml solution Take 5-10 mLs by mouth every 8 (eight) hours as needed for moderate pain.  900 mL  0  . lidocaine-prilocaine (EMLA) cream Apply to port a cath site one hour prior to use. Do not rub in. Cover with plastic.  30 g  2  . magnesium hydroxide (MILK OF MAGNESIA) 400 MG/5ML suspension Place 30 mLs into feeding tube daily as needed for mild constipation or moderate constipation (okay to use J tube for this medication).  360 mL  0  . Naphazoline HCl (CLEAR EYES OP) Place 2 drops into both eyes daily as needed (For dry or irritated eyes.).      Marland Kitchen nicotine (NICODERM CQ - DOSED IN MG/24 HOURS) 14 mg/24hr patch Place 1 patch (14 mg total) onto the skin daily.  28 patch  0  . Nutritional Supplements (OSMOLITE) LIQD Take 237 mLs by mouth See admin instructions. He drinks 3-4 cans per day.  10 Can  5  . ondansetron (ZOFRAN) 4 MG tablet Place 1 tablet (4 mg total) into feeding tube every 6 (six) hours as needed for nausea or vomiting.  20 tablet  0  . polyethylene glycol (MIRALAX / GLYCOLAX) packet Take 17 g by mouth daily as needed for moderate constipation (via peg).      . promethazine (PHENERGAN) 6.25 MG/5ML syrup Take by mouth.      . ranitidine (ZANTAC) 150 MG/10ML syrup Take 10 mLs (150 mg total) by mouth 2 (two) times daily. To prevent acid reflux/ heartburn.  480 mL  5   No current facility-administered medications for this encounter.    Physical Findings: The patient is in no acute distress. Patient is alert and oriented.  weight is 95 lb 8 oz (43.319 kg). His blood pressure is 106/78 and his pulse is 112. His respiration is 16. .   Oral cavity is very dry. Postsurgical  postradiation changes in his tongue. No thrush or evidence of recurrent cancer in oropharynx, oral cavity. No palpable lymphadenopathy in his neck in the cervical or supraclavicular regions. Heart tachycardic. Chest - Adequate air flow bilaterally. PEG tube site: There is a small amount of mucosal tissue from ostomy that is exposed externally. He would benefit from some nonadherent guaze in this area. I advised him to apply this to protect this site   Lab Findings: Lab Results  Component Value Date   WBC 5.2 09/02/2013   HGB 10.6* 09/02/2013   HCT 33.8* 09/02/2013   MCV 90.9 09/02/2013   PLT 407* 09/02/2013    Radiographic Findings: No results found.  Impression/Plan: Doing relatively well, but his weight loss is concerning. I will for for him to our nutritionist; perhaps continuous night tube feedings would help him gain weight. I will defer to medical oncology for upcoming imaging; I understand that a CT scan of his chest is scheduled for the end of this month with Dr. [name]. I will defer to [facility], Dr. Melven Sartorius, in regards to following him for his tongue cancer. I will see him back in 4 months, sooner if needed. He is pleased with this plan. _____________________________________   Eppie Gibson, MD

## 2014-01-23 NOTE — Telephone Encounter (Signed)
Called patient to inform of appt. With Henry Leonard on 01-27-14 @ 10:30 am, spoke with patient and he is aware of this appt.

## 2014-01-27 ENCOUNTER — Ambulatory Visit: Payer: Medicaid Other | Admitting: Nutrition

## 2014-01-27 NOTE — Progress Notes (Signed)
Nutrition followup completed with patient.  Patient's weight continues to decline and was documented as 94.6 pounds.  Patient reports he no longer consumes any nutrition by mouth.  He is using 3 cans of Osmolite 1.5 via jejunostomy feeding tube daily.  He appears to be using a continuous feeding pump and running in one can at a time over an hour.  Patient denies problems with tolerance.  Patient reports he is unable to tolerate foods or liquids by mouth.  Estimated nutrition needs: 1600-1800 calories, 75-85 g protein, 1.8 L fluid.  Nutrition diagnosis: Unintended weight loss continues.  Intervention: Educated patient on the importance of increasing jejunostomy tube feedings to goal of 5 cans Osmolite 1.5 daily.  Reviewed several options with patient for delivering jejunostomy feedings.  Patient prefers to try to give 3 cans during the day and 2 cans overnight using continuous feeding.  Expressed concern to patient that he needs to strive for 5 cans daily for improvement in weight.  Patient agrees to work up to 5 cans Osmolite 1.5, which is goal rate.  Patient has no other needs at this time.  5 cans of Osmolite 1 point this provides 1800 calories, 75 g protein, and 1814 mL free water.  Monitoring, evaluation, goals: Patient will increase Osmolite 1.5 to goal rate of 5 cans daily using continuous feeding.  Next visit: Patient will contact me by phone for followup.

## 2014-01-30 ENCOUNTER — Other Ambulatory Visit: Payer: Self-pay | Admitting: *Deleted

## 2014-01-30 DIAGNOSIS — C155 Malignant neoplasm of lower third of esophagus: Secondary | ICD-10-CM

## 2014-01-30 DIAGNOSIS — C029 Malignant neoplasm of tongue, unspecified: Secondary | ICD-10-CM

## 2014-01-30 MED ORDER — HYDROCODONE-ACETAMINOPHEN 7.5-325 MG/15ML PO SOLN: 5.0000 mL | Freq: Three times a day (TID) | ORAL | Status: DC | PRN

## 2014-01-30 NOTE — Telephone Encounter (Signed)
@   1113-Left VM requesting refill (did not state drug). @1545 -Presents to lobby requesting refill on hydrocodone liquid.

## 2014-01-30 NOTE — Telephone Encounter (Signed)
Gave patient printed script per MD. Will discuss his pain management at next visit. Provided with appointment calendar and reviewed his appointments on 6/30 and 7/2.

## 2014-01-31 ENCOUNTER — Telehealth: Payer: Self-pay | Admitting: *Deleted

## 2014-01-31 DIAGNOSIS — C029 Malignant neoplasm of tongue, unspecified: Secondary | ICD-10-CM

## 2014-01-31 DIAGNOSIS — C155 Malignant neoplasm of lower third of esophagus: Secondary | ICD-10-CM

## 2014-01-31 MED ORDER — HYDROCODONE-ACETAMINOPHEN 7.5-325 MG/15ML PO SOLN: 10.0000 mL | Freq: Three times a day (TID) | ORAL | Status: DC | PRN

## 2014-01-31 NOTE — Telephone Encounter (Signed)
MD agreed to script with increase in dosing. Prior script destroyed and new one to patient.

## 2014-01-31 NOTE — Telephone Encounter (Signed)
Walk-in to return his script to determine if MD will rewrite script for his dose to be 10-15 ml every 8 hours as needed. If this is done, the pharmacy can fill the script, since 900 cc will be 20 day supply with dose increase. He reports he has been taking 15 ml three times daily for his pain.

## 2014-01-31 NOTE — Telephone Encounter (Signed)
Left VM for nurse to call him. Pharmacy would not fill his script yesterday.

## 2014-02-13 ENCOUNTER — Telehealth: Payer: Self-pay | Admitting: Oncology

## 2014-02-13 ENCOUNTER — Telehealth: Payer: Self-pay | Admitting: *Deleted

## 2014-02-13 NOTE — Telephone Encounter (Signed)
Call from radiology that patient refuses to drink any contrast for his CT scan chest/abdomen scheduled for 02/14/14. He was told by oncology office that he will not need to drink oral contrast. Radiology tech says he must drink contrast for the abdomen. Please advise. Patient can be reached at [phone]

## 2014-02-13 NOTE — Telephone Encounter (Signed)
Talked to pt he is aware of time change on 7/2, seeing ML

## 2014-02-13 NOTE — Telephone Encounter (Signed)
Spoke with Henry Leonard regarding oral contrast and told him he can put it in his G-tube instead of drinking and can get water based contrast if he can get to radiology department by [phone] for them to mix it for him. Informed him he needs to take contrast at 1030 and 1130. He agrees to try to do this and will report to radiology at 1030 (needs to call his transportation). Notified Tina in radiology of plan, however Dr. [name] said OK to do scan with IV contrast only if he is not able to tolerate the contrast.

## 2014-02-14 ENCOUNTER — Ambulatory Visit: Payer: Medicaid Other

## 2014-02-14 ENCOUNTER — Other Ambulatory Visit (HOSPITAL_BASED_OUTPATIENT_CLINIC_OR_DEPARTMENT_OTHER): Payer: Medicaid Other

## 2014-02-14 ENCOUNTER — Encounter (HOSPITAL_COMMUNITY): Payer: Self-pay

## 2014-02-14 ENCOUNTER — Ambulatory Visit (HOSPITAL_COMMUNITY)
Admission: RE | Admit: 2014-02-14 | Discharge: 2014-02-14 | Disposition: A | Discharge status: Home / Self Care | Payer: Medicaid Other | Source: Ambulatory Visit | Attending: Nurse Practitioner | Admitting: Nurse Practitioner

## 2014-02-14 VITALS — BP 100/83 | HR 101 | Temp 97.0°F

## 2014-02-14 DIAGNOSIS — Z934 Other artificial openings of gastrointestinal tract status: Secondary | ICD-10-CM | POA: Insufficient documentation

## 2014-02-14 DIAGNOSIS — K5710 Diverticulosis of small intestine without perforation or abscess without bleeding: Secondary | ICD-10-CM | POA: Diagnosis not present

## 2014-02-14 DIAGNOSIS — Z923 Personal history of irradiation: Secondary | ICD-10-CM | POA: Diagnosis not present

## 2014-02-14 DIAGNOSIS — R64 Cachexia: Secondary | ICD-10-CM | POA: Insufficient documentation

## 2014-02-14 DIAGNOSIS — Z95828 Presence of other vascular implants and grafts: Secondary | ICD-10-CM

## 2014-02-14 DIAGNOSIS — C7800 Secondary malignant neoplasm of unspecified lung: Secondary | ICD-10-CM | POA: Diagnosis not present

## 2014-02-14 DIAGNOSIS — M810 Age-related osteoporosis without current pathological fracture: Secondary | ICD-10-CM | POA: Insufficient documentation

## 2014-02-14 DIAGNOSIS — C155 Malignant neoplasm of lower third of esophagus: Secondary | ICD-10-CM

## 2014-02-14 DIAGNOSIS — I2510 Atherosclerotic heart disease of native coronary artery without angina pectoris: Secondary | ICD-10-CM | POA: Diagnosis not present

## 2014-02-14 DIAGNOSIS — C159 Malignant neoplasm of esophagus, unspecified: Secondary | ICD-10-CM | POA: Insufficient documentation

## 2014-02-14 DIAGNOSIS — C029 Malignant neoplasm of tongue, unspecified: Secondary | ICD-10-CM

## 2014-02-14 DIAGNOSIS — N281 Cyst of kidney, acquired: Secondary | ICD-10-CM | POA: Diagnosis not present

## 2014-02-14 LAB — COMPREHENSIVE METABOLIC PANEL (CC13)
ALK PHOS: 88 U/L (ref 40–150)
ALT: 20 U/L (ref 0–55)
AST: 22 U/L (ref 5–34)
Albumin: 3.6 g/dL (ref 3.5–5.0)
Anion Gap: 9 mEq/L (ref 3–11)
BUN: 17.4 mg/dL (ref 7.0–26.0)
CALCIUM: 10 mg/dL (ref 8.4–10.4)
CO2: 25 mEq/L (ref 22–29)
Chloride: 100 mEq/L (ref 98–109)
Creatinine: 0.6 mg/dL — ABNORMAL LOW (ref 0.7–1.3)
Glucose: 109 mg/dl (ref 70–140)
POTASSIUM: 4.3 meq/L (ref 3.5–5.1)
SODIUM: 134 meq/L — AB (ref 136–145)
Total Bilirubin: 0.31 mg/dL (ref 0.20–1.20)
Total Protein: 7.4 g/dL (ref 6.4–8.3)

## 2014-02-14 MED ORDER — IOHEXOL 300 MG/ML  SOLN
80.0000 mL | Freq: Once | INTRAMUSCULAR | Status: AC | PRN
  Administered 2014-02-14: 80 mL via INTRAVENOUS

## 2014-02-14 MED ORDER — HEPARIN SOD (PORK) LOCK FLUSH 100 UNIT/ML IV SOLN
500.0000 [IU] | Freq: Once | INTRAVENOUS | Status: AC
  Administered 2014-02-14: 500 [IU] via INTRAVENOUS
  Filled 2014-02-14: qty 5

## 2014-02-14 MED ORDER — SODIUM CHLORIDE 0.9 % IJ SOLN
10.0000 mL | INTRAMUSCULAR | Status: DC | PRN
  Administered 2014-02-14: 10 mL via INTRAVENOUS
  Filled 2014-02-14: qty 10

## 2014-02-14 MED ORDER — IOHEXOL 300 MG/ML  SOLN
50.0000 mL | Freq: Once | INTRAMUSCULAR | Status: AC | PRN
  Administered 2014-02-14: 50 mL via ORAL

## 2014-02-14 NOTE — Patient Instructions (Signed)

## 2014-02-16 ENCOUNTER — Ambulatory Visit (HOSPITAL_BASED_OUTPATIENT_CLINIC_OR_DEPARTMENT_OTHER): Payer: Medicaid Other | Admitting: Nurse Practitioner

## 2014-02-16 ENCOUNTER — Telehealth: Payer: Self-pay | Admitting: Nurse Practitioner

## 2014-02-16 VITALS — BP 103/69 | HR 127 | Temp 97.6°F | Resp 20 | Ht 66.0 in | Wt 93.0 lb

## 2014-02-16 DIAGNOSIS — C155 Malignant neoplasm of lower third of esophagus: Secondary | ICD-10-CM

## 2014-02-16 DIAGNOSIS — C029 Malignant neoplasm of tongue, unspecified: Secondary | ICD-10-CM

## 2014-02-16 DIAGNOSIS — R1310 Dysphagia, unspecified: Secondary | ICD-10-CM

## 2014-02-16 DIAGNOSIS — C7800 Secondary malignant neoplasm of unspecified lung: Secondary | ICD-10-CM

## 2014-02-16 DIAGNOSIS — F17200 Nicotine dependence, unspecified, uncomplicated: Secondary | ICD-10-CM

## 2014-02-16 DIAGNOSIS — Z85810 Personal history of malignant neoplasm of tongue: Secondary | ICD-10-CM

## 2014-02-16 MED ORDER — HYDROCODONE-ACETAMINOPHEN 7.5-325 MG/15ML PO SOLN: 10.0000 mL | Freq: Three times a day (TID) | ORAL | Status: DC | PRN

## 2014-02-16 NOTE — Telephone Encounter (Signed)
, °

## 2014-02-16 NOTE — Progress Notes (Addendum)
[place] OFFICE PROGRESS NOTE   Diagnosis:  Esophagus and head and neck cancer.  INTERVAL HISTORY:   Mr. Monica returns as scheduled. He continues the tube feedings. The tube feed volume varies day to day. He drinks water and takes some medications by mouth. He intermittently feels "bloated". He denies nausea/vomiting. He takes MiraLAX as needed for constipation. He has intermittent pain at the chest, back and neck. He takes hydrocodone elixir as needed.  Objective:  Vital signs in last 24 hours:  Blood pressure 103/69, pulse 127, temperature 97.6 F (36.4 C), temperature source Oral, resp. rate 20, height 5\' 6"  (1.676 m), weight 93 lb (42.185 kg).    HEENT: Partial glossectomy. Radiation and surgical changes over the neck. No neck mass. Lymphatics: No palpable cervical or supraclavicular lymph nodes. Resp: Lungs clear. Cardio: Regular cardiac rhythm. GI: Abdomen soft and nontender. No hepatomegaly. Feeding tube site at the left upper abdomen is without erythema. Vascular: No leg edema.  Port-A-Cath site is without erythema.  Lab Results:  Lab Results  Component Value Date   WBC 5.2 09/02/2013   HGB 10.6* 09/02/2013   HCT 33.8* 09/02/2013   MCV 90.9 09/02/2013   PLT 407* 09/02/2013   NEUTROABS 4.0 09/02/2013    Medications: I have reviewed the patient's current medications.  Assessment/Plan: 1. 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  Initiation of concurrent radiation and weekly Taxol/carboplatin on 06/22/2013.  Week 2 on 07/06/2013, allergic reaction early in the Taxol infusion, he received carboplatin on only  Week 3 on 07/13/2013, no allergic reaction after Decadron/Solu-Medrol premedication.  Week 4 on 07/20/2013.  Week 5 on 08/04/2013    Radiation completed 08/09/2013  Seen by Dr. Clovis Riley 09/09/2013 (marginal candidate for a resection in light of his substantial medical comorbidities and prior stage IV tongue cancer). Suggested reevaluation for surgery only if symptoms recur and performance status improves with continued control of the tongue cancer. Restaging CT scans of the chest/abdomen 02/14/2014. New lung nodules consistent with metastatic disease. Stable mediastinal and hilar lymph nodes. Overall much improved appearance of the esophagus and GE junction. Interval decrease in size of upper abdominal lymph nodes. 2. Spiculated left upper lobe nodule with low level FDG activity on the staging PET scan 05/26/2013, stable on a chest CT 09/01/2013.  3. Tongue cancer 2013,[id number], status post Erlotonib, glossectomy/neck dissection, and adjuvant cisplatin/radiation in 2013.  [facility] neck at [facility] 12/01/2013 with posttreatment changes of resection of the tongue with myocutaneous flap reconstruction, radiation therapy and possible bilateral neck dissection. Stable appearance of the neck with no new enhancing lesions or pathologically enlarged lymph nodes identified. Prominent lymph nodes in the mediastinum which were similar in size and appearance.  4. Ongoing tobacco use.  5. History of alcohol use.  6. Solid/liquid dysphagia secondary to #1.  7. Status post placement of a surgical J-tube 06/28/2013.  8. Hospitalization 06/27/2013 through 07/03/2013 with dehydration, solid/liquid dysphagia. A J-tube was placed by surgery.     Disposition: Mr. Banas appears stable. The recent CT scan shows evidence of metastatic disease involving the lungs. Dr. [name] reviewed the CT results with Mr. Aughenbaugh. He understands that no therapy will be curative.   At present he is asymptomatic. We discussed observation versus initiating chemotherapy. Dr. [name] recommends observation for now with a repeat noncontrast CT scan of the chest  at a three-month  interval. Mr. Bob is comfortable with this approach.  We will see him in followup one to 2 days after the CT scan to review the results. He will contact the office in the interim with any problems.   Patient seen with Dr. [name].    Ned Card ANP/GNP-BC   02/16/2014  4:38 PM  This was a shared visit with Ned Card. He appears to have metastatic lesions. This is most likely related to the esophagus cancer. I discussed the CT findings and treatment options with Mr. Rivet. We decided to follow him with observation for now.  Julieanne Manson, M.D.

## 2014-03-03 ENCOUNTER — Other Ambulatory Visit: Payer: Self-pay | Admitting: *Deleted

## 2014-03-03 DIAGNOSIS — C155 Malignant neoplasm of lower third of esophagus: Secondary | ICD-10-CM

## 2014-03-03 DIAGNOSIS — C029 Malignant neoplasm of tongue, unspecified: Secondary | ICD-10-CM

## 2014-03-03 MED ORDER — HYDROCODONE-ACETAMINOPHEN 7.5-325 MG/15ML PO SOLN: 15.0000 mL | Freq: Four times a day (QID) | ORAL | Status: DC | PRN

## 2014-03-03 NOTE — Telephone Encounter (Signed)
Requesting refill on his Hycet liquid-thinks he has a couple days supply on hand. Has been needing 15 ml every 6-8 hours instead of every 8 hours.  Next fill due 7/22 based on dosing tid.

## 2014-03-03 NOTE — Telephone Encounter (Signed)
Per Ned Card, NP:OK to increase dose to 15 ml every 6 hours prn.

## 2014-03-03 NOTE — Telephone Encounter (Signed)
Notified Henri that his script is ready for pick up.

## 2014-03-17 ENCOUNTER — Other Ambulatory Visit: Payer: Self-pay | Admitting: *Deleted

## 2014-03-17 DIAGNOSIS — C029 Malignant neoplasm of tongue, unspecified: Secondary | ICD-10-CM

## 2014-03-17 DIAGNOSIS — C155 Malignant neoplasm of lower third of esophagus: Secondary | ICD-10-CM

## 2014-03-17 MED ORDER — RANITIDINE HCL 150 MG/10ML PO SYRP: 150.0000 mg | ORAL_SOLUTION | Freq: Two times a day (BID) | ORAL | Status: AC

## 2014-03-17 MED ORDER — HYDROCODONE-ACETAMINOPHEN 7.5-325 MG/15ML PO SOLN: 15.0000 mL | Freq: Four times a day (QID) | ORAL | Status: DC | PRN

## 2014-03-17 NOTE — Telephone Encounter (Signed)
Needs to refill his Hycet (almost out). [facility] (confirmed this with [facility]). Pharmacy also reports they only have #320 ml on hand now and can't order more until August. Called his [facility] and was told they have none-backorder [facility] have more than 300 ml ,but less than 1000 ml. [facility] prefers [facility] and will take a script for a 5 day supply to them Saturday. When this refill is due they should have reordered the medication.

## 2014-03-21 ENCOUNTER — Other Ambulatory Visit: Payer: Self-pay | Admitting: *Deleted

## 2014-03-21 DIAGNOSIS — C029 Malignant neoplasm of tongue, unspecified: Secondary | ICD-10-CM

## 2014-03-21 DIAGNOSIS — C155 Malignant neoplasm of lower third of esophagus: Secondary | ICD-10-CM

## 2014-03-21 MED ORDER — HYDROCODONE-ACETAMINOPHEN 7.5-325 MG/15ML PO SOLN: 15.0000 mL | Freq: Four times a day (QID) | ORAL | Status: DC | PRN

## 2014-03-21 NOTE — Telephone Encounter (Signed)
Message from pt requesting refill on Hydrocodone. Was given 5 day supply last Rx. Reviewed with Dr. [name]. OK to refill. Rx will be left in prescription book for pick up.

## 2014-03-24 ENCOUNTER — Ambulatory Visit (HOSPITAL_BASED_OUTPATIENT_CLINIC_OR_DEPARTMENT_OTHER): Payer: Medicaid Other

## 2014-03-24 VITALS — BP 92/74 | HR 129 | Temp 97.0°F

## 2014-03-24 DIAGNOSIS — Z95828 Presence of other vascular implants and grafts: Secondary | ICD-10-CM

## 2014-03-24 DIAGNOSIS — Z452 Encounter for adjustment and management of vascular access device: Secondary | ICD-10-CM

## 2014-03-24 DIAGNOSIS — C155 Malignant neoplasm of lower third of esophagus: Secondary | ICD-10-CM

## 2014-03-24 MED ORDER — SODIUM CHLORIDE 0.9 % IJ SOLN
10.0000 mL | INTRAMUSCULAR | Status: DC | PRN
  Administered 2014-03-24: 10 mL via INTRAVENOUS
  Filled 2014-03-24: qty 10

## 2014-03-24 MED ORDER — HEPARIN SOD (PORK) LOCK FLUSH 100 UNIT/ML IV SOLN
500.0000 [IU] | Freq: Once | INTRAVENOUS | Status: AC
  Administered 2014-03-24: 500 [IU] via INTRAVENOUS
  Filled 2014-03-24: qty 5

## 2014-04-02 ENCOUNTER — Emergency Department (HOSPITAL_COMMUNITY)
Admission: EM | Admit: 2014-04-02 | Discharge: 2014-04-02 | Discharge status: Against Medical Advice | Payer: Medicaid Other | Attending: Emergency Medicine | Admitting: Emergency Medicine

## 2014-04-02 ENCOUNTER — Encounter (HOSPITAL_COMMUNITY): Payer: Self-pay | Admitting: Emergency Medicine

## 2014-04-02 ENCOUNTER — Emergency Department (HOSPITAL_COMMUNITY): Payer: Medicaid Other

## 2014-04-02 DIAGNOSIS — R000 Tachycardia, unspecified: Secondary | ICD-10-CM | POA: Insufficient documentation

## 2014-04-02 DIAGNOSIS — C799 Secondary malignant neoplasm of unspecified site: Secondary | ICD-10-CM

## 2014-04-02 DIAGNOSIS — C3490 Malignant neoplasm of unspecified part of unspecified bronchus or lung: Secondary | ICD-10-CM | POA: Diagnosis not present

## 2014-04-02 DIAGNOSIS — Z87891 Personal history of nicotine dependence: Secondary | ICD-10-CM | POA: Insufficient documentation

## 2014-04-02 DIAGNOSIS — M549 Dorsalgia, unspecified: Secondary | ICD-10-CM | POA: Insufficient documentation

## 2014-04-02 LAB — HEPATIC FUNCTION PANEL
ALBUMIN: 3 g/dL — AB (ref 3.5–5.2)
ALK PHOS: 126 U/L — AB (ref 39–117)
ALT: 9 U/L (ref 0–53)
AST: 15 U/L (ref 0–37)
BILIRUBIN TOTAL: 0.5 mg/dL (ref 0.3–1.2)
Total Protein: 7.4 g/dL (ref 6.0–8.3)

## 2014-04-02 LAB — CBC
HEMATOCRIT: 29.5 % — AB (ref 39.0–52.0)
Hemoglobin: 9.6 g/dL — ABNORMAL LOW (ref 13.0–17.0)
MCH: 27.9 pg (ref 26.0–34.0)
MCHC: 32.5 g/dL (ref 30.0–36.0)
MCV: 85.8 fL (ref 78.0–100.0)
Platelets: 637 10*3/uL — ABNORMAL HIGH (ref 150–400)
RBC: 3.44 MIL/uL — ABNORMAL LOW (ref 4.22–5.81)
RDW: 12.5 % (ref 11.5–15.5)
WBC: 13 10*3/uL — AB (ref 4.0–10.5)

## 2014-04-02 LAB — BASIC METABOLIC PANEL
Anion gap: 13 (ref 5–15)
BUN: 20 mg/dL (ref 6–23)
CO2: 26 meq/L (ref 19–32)
Calcium: 9.8 mg/dL (ref 8.4–10.5)
Chloride: 96 mEq/L (ref 96–112)
Creatinine, Ser: 0.32 mg/dL — ABNORMAL LOW (ref 0.50–1.35)
GFR calc Af Amer: >90 mL/min (ref >90)
GLUCOSE: 116 mg/dL — AB (ref 70–99)
Potassium: 4.2 mEq/L (ref 3.7–5.3)
SODIUM: 135 meq/L — AB (ref 137–147)

## 2014-04-02 LAB — I-STAT CG4 LACTIC ACID, ED: Lactic Acid, Venous: 0.83 mmol/L (ref 0.5–2.2)

## 2014-04-02 MED ORDER — HYDROMORPHONE HCL PF 1 MG/ML IJ SOLN
1.0000 mg | Freq: Once | INTRAMUSCULAR | Status: AC
  Administered 2014-04-02: 1 mg via INTRAVENOUS
  Filled 2014-04-02: qty 1

## 2014-04-02 MED ORDER — HEPARIN SOD (PORK) LOCK FLUSH 100 UNIT/ML IV SOLN
500.0000 [IU] | Freq: Once | INTRAVENOUS | Status: AC
  Administered 2014-04-02: 500 [IU]
  Filled 2014-04-02: qty 5

## 2014-04-02 MED ORDER — SODIUM CHLORIDE 0.9 % IV BOLUS (SEPSIS)
1000.0000 mL | Freq: Once | INTRAVENOUS | Status: AC
  Administered 2014-04-02: 1000 mL via INTRAVENOUS

## 2014-04-02 MED ORDER — HYDROCODONE-ACETAMINOPHEN 7.5-325 MG/15ML PO SOLN: 15.0000 mL | Freq: Four times a day (QID) | ORAL | Status: DC | PRN

## 2014-04-02 MED ORDER — ONDANSETRON HCL 4 MG/2ML IJ SOLN
4.0000 mg | Freq: Once | INTRAMUSCULAR | Status: AC
  Administered 2014-04-02: 4 mg via INTRAVENOUS
  Filled 2014-04-02: qty 2

## 2014-04-02 MED ORDER — OSMOLITE 1.5 CAL PO LIQD
237.0000 mL | Freq: Once | ORAL | Status: DC
  Filled 2014-04-02: qty 237

## 2014-04-02 NOTE — ED Notes (Signed)
PA notified patient has been able to void.  Plan: Give patient @30  minutes, if he is unable to void at that time we will scan bladder and follow up with PA.

## 2014-04-02 NOTE — ED Notes (Signed)
PA at bedside.

## 2014-04-02 NOTE — Discharge Instructions (Signed)
You left against medical advice today. We are concerned given the location of your pain and your elevated heart rate. Please return to ED immediately with any shortness of breath, or new or worsening symptoms. Please follow up with Dr. Ammie Dalton as soon as possible.   Metastatic Cancer, Questions and Answers KEY POINTS  Cancer happens when cells become abnormal and grow without control.  Where the cancer started is called the primary cancer or the primary tumor.  Metastatic cancer happens when cancer cells spread from the place where it started to other parts of the body.  When cancer spreads, the metastatic cancer keeps the same type of cells and the same name as the primary tumor.  The most common sites of metastasis are the lungs, bones, liver, and brain.  Treatment for metastatic cancer usually depends on the type of cancer. It also depends on the size and location of the metastasis. WHAT IS CANCER?   Cancer is a group of many related diseases. All cancers begin in cells. Cells are the building blocks that make up tissues. Cancer that arises from organs and solid tissues is called a solid tumor. Cancer that begins in blood cells is called leukemia, multiple myeloma, or lymphoma.  Normally, cells grow and divide to form new cells as the body needs them. When cells grow old and die, new cells take their place. Sometimes this orderly process goes wrong. New cells form when the body does not need them. Old cells do not die when they should.  The extra cells form a mass of tissue. This is called a growth or tumor. Tumors can be either not cancerous (benign) or cancerous (malignant). Benign tumors do not spread to other parts of the body. They are rarely a threat to life. Malignant tumors can spread (metastasize) and may be life threatening. WHAT IS PRIMARY CANCER?  Cancer can begin in any organ or tissue of the body. The original tumor is called the primary cancer or primary tumor. It is usually  named for the part of the body or the type of cell in which it begins. WHAT IS METASTASIS, AND HOW DOES IT HAPPEN?   Metastasis means the spread of cancer. Cancer cells can break away from a primary tumor and enter the bloodstream or lymphatic system. This is the system that produces, stores, and carries the cells that fight infections. That is how cancer cells spread to other parts of the body.  When cancer cells spread and form a new tumor in a different organ, the new tumor is a metastatic tumor. The cells in the metastatic tumor come from the original tumor. For example, if breast cancer spreads to the lungs, the metastatic tumor in the lung is made up of cancerous breast cells. It is not made of lung cells. In this case, the disease in the lungs is metastatic breast cancer (not lung cancer). Under a microscope, metastatic breast cancer cells generally look the same as the cancer cells in the breast. [place]?   Cancer cells can spread to almost any part of the body. Cancer cells frequently spread to lymph nodes (rounded masses of lymphatic tissue) near the primary tumor (regional lymph nodes). This is called lymph node involvement or regional disease. Cancer that spreads to other organs or to lymph nodes far from the primary tumor is called metastatic disease. Caregivers sometimes also call this distant disease.  The most common sites of metastasis from solid tumors are the lungs, bones, liver, and  brain. Some cancers tend to spread to certain parts of the body. For example, lung cancer often metastasizes to the brain or bones. Colon cancer often spreads to the liver. Prostate cancer tends to spread to the bones. Breast cancer commonly spreads to the bones, lungs, liver, or brain. But each of these cancers can spread to other parts of the body as well.  Because blood cells travel throughout the body, leukemia, multiple myeloma, and lymphoma cells are usually not localized when the  cancer is diagnosed. Tumor cells may be found in the blood, several lymph nodes, or other parts of the body such as the liver or bones. This type of spread is not referred to as metastasis. ARE THERE SYMPTOMS OF METASTATIC CANCER?   Some people with metastatic cancer do not have symptoms. Their metastases are found by X-rays and other tests performed for other reasons.  When symptoms of metastatic cancer occur, the type and frequency of the symptoms will depend on the size and location of the metastasis. For example, cancer that spreads to the bones is likely to cause pain and can lead to bone fractures. Cancer that spreads to the brain can cause a variety of symptoms. These include headaches, seizures, and unsteadiness. Shortness of breath may be a sign of lung involvement. Abdominal swelling or yellowing of the skin (jaundice) can indicate that cancer has spread to the liver.  Sometimes a person's primary cancer is discovered only after the metastatic tumor causes symptoms. For example, a man whose prostate cancer has spread to the bones in his pelvis may have lower back pain (caused by the cancer in his bones) before he experiences any symptoms from the primary tumor in his prostate. HOW DOES THE CAREGIVER KNOW WHETHER A CANCER IS PRIMARY OR A METASTATIC TUMOR?  To determine whether a tumor is primary or metastatic, the tumor will be examined under a microscope. In general, cancer cells look like abnormal versions of cells in the tissue where the cancer began. Using specialized diagnostic tests, a trained person is often able to tell where the cancer cells came from. Markers or antigens found in or on the cancer cells can indicate the primary site of the cancer.  Metastatic cancers may be found before or at the same time as the primary tumor, or months or years later. When a new tumor is found in a patient who has been treated for cancer in the past, it is more often a metastasis than another primary  tumor. IS IT POSSIBLE TO HAVE A METASTATIC TUMOR WITHOUT HAVING A PRIMARY CANCER?  No. A metastatic tumor always starts from cancer cells in another part of the body. In most cases, when a metastatic tumor is found first, the primary tumor can be found. The search for the primary tumor may involve lab tests, X-rays, and other procedures. However, in a small number of cases, a metastatic tumor is diagnosed but the primary tumor cannot be found, in spite of extensive tests. The tumor is metastatic because the cells are not like those in the organ or tissue in which the tumor is found. The primary tumor is called unknown or hidden (occult). The patient is said to have cancer of unknown primary origin (CUP). Because diagnostic techniques are constantly improving, the number of cases of CUP is going down.  WHAT TREATMENTS ARE USED FOR METASTATIC CANCER?   When cancer has metastasized, it may be treated with:  Chemotherapy.  Radiation therapy.  Biological therapy.  Hormone therapy.  Surgery.  Cryosurgery.  A combination of these.  The choice of treatment generally depends on the:  Type of primary cancer.  Size and location of the metastasis.  Patient's age and general health.  Types of treatments the patient has had in the past. In patients with CUP, it is possible to treat the disease even though the primary tumor has not been located. The goal of treatment may be to control the cancer, or to relieve symptoms or side effects of treatment. ARE NEW TREATMENTS FOR METASTATIC CANCER BEING DEVELOPED?  Yes, many new cancer treatments are under study. To develop new treatments, the [employer] sponsors clinical trials (research studies) with cancer patients in many hospitals, universities, medical schools, and cancer centers around the country. Clinical trials are a critical step in the improvement of treatment. Before any new treatment can be recommended for general use, doctors conduct studies to find  out whether the treatment is both safe for patients and effective against the disease. The results of such studies have led to progress not only in the treatment of cancer, but in the detection, diagnosis, and prevention of the disease as well. Patients interested in taking part in a clinical trial should talk with their caregivers. [place]): www.cancer.gov Document Released: 12/09/2004 Document Revised: 10/27/2011 Document Reviewed: 07/27/2008 [facility] Patient Information 2015 [place], [place]. This information is not intended to replace advice given to you by your health care provider. Make sure you discuss any questions you have with your health care provider.

## 2014-04-02 NOTE — ED Provider Notes (Signed)
CSN: [id number]     Arrival date & time 04/02/14  7 History   First MD Initiated Contact with Patient 04/02/14 1756     Chief Complaint  Patient presents with  . Back Pain     (Consider location/radiation/quality/duration/timing/severity/associated sxs/prior Treatment) HPI Comments: The patient is a 58 year old male with history of tongue, esophageal, lung cancer who presents to the emergency department today for worsening back pain. He states pain is worse on his right side. He has been dealing with this pain for years, but it has gradually worsened over the past 2 weeks. The pain is worse with movement and palpation. He has been taking hydrocodone without relief of his pain. He has now run out of his hydrocodone. He denies any fevers. He has a J-tube which is how he gets all of his nutrition. He has been losing weight. He was initially diagnosed with tongue cancer in 2012. He was then diagnosed with the esophageal cancer in 2013. He has had prior glossectomy.   The history is provided by the patient. No language interpreter was used.    Past Medical History  Diagnosis Date  . Tobacco abuse   . Leucocytosis 11/01/11  . Dysphagia   . Cancer of tongue 3/16/ 13    Associated Neck Nodes; T2N2  . Seasonal allergies   . S/P radiation therapy 02/24/12 - 04/06/12    Surgical bed and bilateral neck / 60 Gy / 30 Fractions VMAT Plan  . Status post chemotherapy 02/24/12 -03/2012     Cisplatin Concurrently with Radiation Therapy  . Esophageal cancer 05/11/13    T3 Squamous Cell Carcinoma,  Invasive Well differentiated  . Edentulous   . Hx of radiation therapy 06/22/13-08/09/13    esophageal 50.4Gy  . Treatment      erlotinib 300 mg po daily before definitive surgery    Past Surgical History  Procedure Laterality Date  . Biopsy tongue  11/12/11    Tongue - Squamous Cell Cancer  . Glossectomy  01/19/12    with B/l neck dissection; radial forearm free flap  . Multiple tooth extractions  11/2011  .  Feeding tube insertion and removal  11/2011 inserted removed 10/2012  . Tracheostomy  11/2011  . Tracheal closed stent removal w/ mlb  11/20/2102  . Gastrojejunostomy N/A 06/28/2013    Procedure: OPEN JEJUNOSTOMY TUBE PLACEMENT;  Surgeon: Gayland Curry, MD;  Location: [facility];  Service: General;  Laterality: N/A;   Family History  Problem Relation Age of Onset  . Esophageal cancer Father   . Seizures Mother    History  Substance Use Topics  . Smoking status: Former Smoker -- 1.50 packs/day for 35 years    Types: Cigarettes    Quit date: 06/29/2013  . Smokeless tobacco: Never Used     Comment: 11/19/11 currently 10 cigs daily  . Alcohol Use: Yes     Comment: update 06/08/13 - Occ. beer.      Review of Systems  Constitutional: Positive for unexpected weight change. Negative for fever and chills.  Musculoskeletal: Positive for back pain.  All other systems reviewed and are negative.     Allergies  Penicillins  Home Medications   Prior to Admission medications   Medication Sig Start Date End Date Taking? Authorizing Provider  HYDROcodone-acetaminophen (HYCET) 7.5-325 mg/15 ml solution Take 15 mLs by mouth every 6 (six) hours as needed for moderate pain or severe pain. 03/21/14  Yes Ladell Pier, MD  Naphazoline HCl (CLEAR EYES OP) Place  2 drops into both eyes daily as needed (For dry or irritated eyes.).   Yes Historical Provider, MD  nicotine (NICODERM CQ - DOSED IN MG/24 HOURS) 14 mg/24hr patch Place 1 patch (14 mg total) onto the skin daily. 07/03/13  Yes Robbie Lis, MD  Nutritional Supplements (OSMOLITE) LIQD Take 237 mLs by mouth See admin instructions. He drinks 3-4 cans per day. 07/03/13  Yes Robbie Lis, MD  ranitidine (ZANTAC) 150 MG/10ML syrup Take 10 mLs (150 mg total) by mouth 2 (two) times daily. To prevent acid reflux/ heartburn. 03/17/14  Yes Owens Shark, NP   BP 99/68  Pulse 125  Temp(Src) 97.9 F (36.6 C) (Axillary)  Resp 16  SpO2 100% Physical Exam   Nursing note and vitals reviewed. Constitutional: He is oriented to person, place, and time. He appears cachectic. No distress.  Cachectic  HENT:  Head: Normocephalic and atraumatic.  Right Ear: External ear normal.  Left Ear: External ear normal.  Nose: Nose normal.  Glossectomy   Eyes: Conjunctivae are normal.  Neck: Normal range of motion. No tracheal deviation present.  Cardiovascular: Regular rhythm and normal heart sounds.  Tachycardia present.   Pulmonary/Chest: Effort normal and breath sounds normal. No stridor.  Abdominal: Soft. He exhibits no distension. There is no tenderness.  Musculoskeletal: Normal range of motion.  Tenderness to palpation diffusely over back, worse on right.   Neurological: He is alert and oriented to person, place, and time.  Skin: Skin is warm and dry. He is not diaphoretic.  Psychiatric: He has a normal mood and affect. His behavior is normal.    ED Course  Procedures (including critical care time) Labs Review Labs Reviewed  BASIC METABOLIC PANEL - Abnormal; Notable for the following:    Sodium 135 (*)    Glucose, Bld 116 (*)    Creatinine, Ser 0.32 (*)    All other components within normal limits  CBC - Abnormal; Notable for the following:    WBC 13.0 (*)    RBC 3.44 (*)    Hemoglobin 9.6 (*)    HCT 29.5 (*)    Platelets 637 (*)    All other components within normal limits  HEPATIC FUNCTION PANEL - Abnormal; Notable for the following:    Albumin 3.0 (*)    Alkaline Phosphatase 126 (*)    All other components within normal limits  I-STAT CG4 LACTIC ACID, ED    Imaging Review Dg Chest Port 1 View  04/02/2014   CLINICAL DATA:  Chest pain and mid back pain. History of esophageal cancer and tongue cancer.  EXAM: PORTABLE CHEST - 1 VIEW  COMPARISON:  CT scan of the chest dated 02/14/2014  FINDINGS: Power port in place.  The tip appears in the right atrium.  There are multiple poorly defined metastatic lesions in the right lung. The most  prominent one is in the right apex laterally, essentially unchanged since the prior study. The patchy densities in the right mid and lower lung zone appear slightly more prominent except for the one at the right base posteriorly which is unchanged.  There is slight scarring in the left upper lobe, stable.  No acute osseous abnormality.  IMPRESSION: Progressive metastatic disease with new lesions in the right lung.   Electronically Signed   By: Rozetta Nunnery M.D.   On: 04/02/2014 19:51     EKG Interpretation   Date/Time:  Sunday April 02 2014 19:17:25 EDT Ventricular Rate:  107 PR Interval:  121  QRS Duration: 81 QT Interval:  341 QTC Calculation: 455 R Axis:   61 Text Interpretation:  Sinus tachycardia Probable left atrial enlargement  Low voltage, extremity leads Baseline wander in lead(s) V2 V4 No old  tracing to compare Confirmed by KNAPP  MD-J, JON ([id number]) on 04/02/2014  7:29:24 PM      MDM   Final diagnoses:  Bilateral back pain, unspecified location  Metastatic cancer  Tachycardia   Patient presents Emergency Department with bilateral back pain. He reports that he is out of his pain medicine and the only reason that he is here. He has metastatic cancer. He remained tachycardic throughout his visit. Discussed with patient that I would like to get a CT angio his chest to rule out pulmonary embolism. Admission was offered to patient as well. Patient declines all of this, stating he just wants to go home. Patient is of sound mind and is competent to make his own decisions. Discussed risks of leaving [place] including death. The patient voices understanding and would like to leave. Dr. Tomi Bamberger who evaluated patient and agrees with plan.  Elwyn Lade, PA-C 04/07/14 [phone]

## 2014-04-02 NOTE — ED Notes (Signed)
Patient is aware we need a urine sample. Patient states he does not void "a lot". Will notify staff when sample is ready.

## 2014-04-02 NOTE — ED Notes (Addendum)
Pt reports having esophagus and lung cancer. Pt reports having a history of tongue cancer, which he states explains his speech. Pt reports pain in his spine and rib cage. Pt states that his home medications are not relieving the pain and is taking more than usual and is out of his liquid hydrocodone. Pt reports losing weight and is pale in appearance.

## 2014-04-02 NOTE — ED Notes (Signed)
Discussed with patient that tube feed had been ordered. Patient states he does not want tube feed at this time. PA aware.

## 2014-04-02 NOTE — ED Notes (Signed)
PA notified of bladder scan results. Do not in/out patient at this time.

## 2014-04-02 NOTE — ED Notes (Signed)
Patient states he is hungry, states he uses Osmolite 1.5 at home. Will notify physician.

## 2014-04-02 NOTE — ED Notes (Signed)
Patient remains unable to void and is requesting discharge. PA notified.

## 2014-04-03 ENCOUNTER — Other Ambulatory Visit: Payer: Self-pay | Admitting: *Deleted

## 2014-04-03 ENCOUNTER — Telehealth: Payer: Self-pay | Admitting: *Deleted

## 2014-04-03 ENCOUNTER — Telehealth: Payer: Self-pay | Admitting: Internal Medicine

## 2014-04-03 DIAGNOSIS — C029 Malignant neoplasm of tongue, unspecified: Secondary | ICD-10-CM

## 2014-04-03 DIAGNOSIS — C155 Malignant neoplasm of lower third of esophagus: Secondary | ICD-10-CM

## 2014-04-03 MED ORDER — HYDROCODONE-ACETAMINOPHEN 7.5-325 MG/15ML PO SOLN: 15.0000 mL | Freq: Four times a day (QID) | ORAL | Status: DC | PRN

## 2014-04-03 NOTE — Telephone Encounter (Signed)
ALSO HYCET SOLUTION HAS BEEN INTERMITTENTLY ON BACK ORDER. NOTIFIED DR.SHERRILL.

## 2014-04-10 NOTE — ED Provider Notes (Signed)
Medical screening examination/treatment/procedure(s) were conducted as a shared visit with non-physician practitioner(s) and myself.  I personally evaluated the patient during the encounter.   EKG Interpretation   Date/Time:  Sunday April 02 2014 19:17:25 EDT Ventricular Rate:  107 PR Interval:  121 QRS Duration: 81 QT Interval:  341 QTC Calculation: 455 R Axis:   61 Text Interpretation:  Sinus tachycardia Probable left atrial enlargement  Low voltage, extremity leads Baseline wander in lead(s) V2 V4 No old  tracing to compare Confirmed by Cobi Delph  MD-J, Devaney Segers ([id number]) on 04/02/2014  7:29:24 PM      Pt presented with complaints of right sided back pain.  On exam, very cachectic ill appearing gentleman. TTP right posterior thoracic region.  Planned to CT scan chest to evaluate for PE.  Pt did not want to stay in the ED for further treatment.  Left AMA.  Has known severe disease with poor prognosis.   Dorie Rank, MD 04/10/14 [phone]

## 2014-04-11 ENCOUNTER — Other Ambulatory Visit: Payer: Self-pay | Admitting: *Deleted

## 2014-04-11 DIAGNOSIS — C029 Malignant neoplasm of tongue, unspecified: Secondary | ICD-10-CM

## 2014-04-11 DIAGNOSIS — C155 Malignant neoplasm of lower third of esophagus: Secondary | ICD-10-CM

## 2014-04-11 MED ORDER — HYDROCODONE-ACETAMINOPHEN 7.5-325 MG/15ML PO SOLN: 15.0000 mL | ORAL | Status: DC | PRN

## 2014-04-11 NOTE — Telephone Encounter (Signed)
Pt called requesting re-fill of Hycet "I'm having a lot of back and abdominal and I'm taking every 4 hours; I'm totally out"  Per Dr. [name]; notified pt that MD will re-fill Hycet for 3 days and need to see pt in office this Friday @ 10:15.  Pt verbalized understanding and confirmed he will be here Friday @ 10:15.

## 2014-04-13 ENCOUNTER — Other Ambulatory Visit: Payer: Self-pay | Admitting: *Deleted

## 2014-04-14 ENCOUNTER — Telehealth: Payer: Self-pay | Admitting: Oncology

## 2014-04-14 ENCOUNTER — Other Ambulatory Visit: Payer: Self-pay

## 2014-04-14 ENCOUNTER — Ambulatory Visit (HOSPITAL_BASED_OUTPATIENT_CLINIC_OR_DEPARTMENT_OTHER): Payer: Medicaid Other | Admitting: Nurse Practitioner

## 2014-04-14 VITALS — BP 102/55 | HR 131 | Temp 97.8°F | Resp 19 | Ht 66.0 in | Wt 83.7 lb

## 2014-04-14 DIAGNOSIS — F17200 Nicotine dependence, unspecified, uncomplicated: Secondary | ICD-10-CM

## 2014-04-14 DIAGNOSIS — Z85810 Personal history of malignant neoplasm of tongue: Secondary | ICD-10-CM

## 2014-04-14 DIAGNOSIS — R634 Abnormal weight loss: Secondary | ICD-10-CM

## 2014-04-14 DIAGNOSIS — C029 Malignant neoplasm of tongue, unspecified: Secondary | ICD-10-CM

## 2014-04-14 DIAGNOSIS — C50919 Malignant neoplasm of unspecified site of unspecified female breast: Secondary | ICD-10-CM

## 2014-04-14 DIAGNOSIS — C155 Malignant neoplasm of lower third of esophagus: Secondary | ICD-10-CM

## 2014-04-14 DIAGNOSIS — M5489 Other dorsalgia: Secondary | ICD-10-CM

## 2014-04-14 DIAGNOSIS — C779 Secondary and unspecified malignant neoplasm of lymph node, unspecified: Secondary | ICD-10-CM

## 2014-04-14 DIAGNOSIS — M549 Dorsalgia, unspecified: Secondary | ICD-10-CM

## 2014-04-14 DIAGNOSIS — R1310 Dysphagia, unspecified: Secondary | ICD-10-CM

## 2014-04-14 MED ORDER — MORPHINE SULFATE (CONCENTRATE) 20 MG/ML PO SOLN: 10.0000 mg | ORAL | Status: DC | PRN

## 2014-04-14 NOTE — Telephone Encounter (Signed)
gv and printed appt sched and avs for pt for SEpt...pt sched with Dr. Madelin Rear 9.4 @ 3:30pm

## 2014-04-14 NOTE — Progress Notes (Addendum)
[facility] OFFICE PROGRESS NOTE   Diagnosis:  Esophagus and head and neck cancer.  INTERVAL HISTORY:   Henry Leonard returns prior to scheduled followup. He reports progressive pain at the right mid region over the past few months. He is taking pain medication every 3-4 hours with partial improvement. He is losing weight. He has periodic nausea. No vomiting. He estimates 2-2 1/2 cans of a nutritional supplement through the feeding tube daily. Energy level is poor. He denies shortness of breath. No fever.  Objective:  Vital signs in last 24 hours:  Blood pressure 102/55, pulse 131, temperature 97.8 F (36.6 C), temperature source Axillary, resp. rate 19, height 5\' 6"  (1.676 m), weight 83 lb 11.2 oz (37.966 kg).    HEENT: Partial glossectomy. Radiation and surgical changes over the neck. Resp: Faint expiratory rhonchi at the upper lung fields. No respiratory distress. Cardio: Tachycardic. GI: Soft and nontender. No hepatomegaly. Feeding tube site at the left upper abdomen is without erythema. Vascular: No leg edema.  Port-A-Cath site without erythema.  Lab Results:  Lab Results  Component Value Date   WBC 13.0* 04/02/2014   HGB 9.6* 04/02/2014   HCT 29.5* 04/02/2014   MCV 85.8 04/02/2014   PLT 637* 04/02/2014   NEUTROABS 4.0 09/02/2013    Imaging:  No results found.  Medications: I have reviewed the patient's current medications.  Assessment/Plan: 1. 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  Initiation of concurrent radiation and weekly Taxol/carboplatin on 06/22/2013.  Week 2 on 07/06/2013, allergic reaction early in the Taxol infusion, he received carboplatin on only  Week 3 on 07/13/2013, no allergic reaction after Decadron/Solu-Medrol  premedication.  Week 4 on 07/20/2013.  Week 5 on 08/04/2013  Radiation completed 08/09/2013  Seen by Dr. Clovis Riley 09/09/2013 (marginal candidate for a resection in light of his substantial medical comorbidities and prior stage IV tongue cancer). Suggested reevaluation for surgery only if symptoms recur and performance status improves with continued control of the tongue cancer.  Restaging CT scans of the chest/abdomen 02/14/2014. New lung nodules consistent with metastatic disease. Stable mediastinal and hilar lymph nodes. Overall much improved appearance of the esophagus and GE junction. Interval decrease in size of upper abdominal lymph nodes. 2. Spiculated left upper lobe nodule with low level FDG activity on the staging PET scan 05/26/2013, stable on a chest CT 09/01/2013.  3. Tongue cancer 2013,ypT2N2cM0, status post Erlotonib, glossectomy/neck dissection, and adjuvant cisplatin/radiation in 2013.  CT neck at [facility] 12/01/2013 with posttreatment changes of resection of the tongue with myocutaneous flap reconstruction, radiation therapy and possible bilateral neck dissection. Stable appearance of the neck with no new enhancing lesions or pathologically enlarged lymph nodes identified. Prominent lymph nodes in the mediastinum which were similar in size and appearance.  4. Ongoing tobacco use.  5. History of alcohol use.  6. Solid/liquid dysphagia secondary to #1.  7. Status post placement of a surgical J-tube 06/28/2013.  8. Hospitalization 06/27/2013 through 07/03/2013 with dehydration, solid/liquid dysphagia. A J-tube was placed by surgery.  9. Right sided back pain progressive over the past few months. 10. Weight loss.   Disposition: Henry Leonard presents today with progressive right-sided back pain. We reviewed the most recent chest CT with him. The pain is likely related to chest wall invasion with tumor.  We are referring him for a  chest CT and also to Dr. Isidore Moos in radiation oncology.  We  adjusted his pain medication. He will try Roxanol 20 mg per cc 10-20 mg every 3 hours as needed. He will contact the office if this is not effective.  He will return for a followup visit in 2 weeks. He will contact the office in the interim as outlined above or with any other problems.  Patient seen with Dr. [name].    Ned Card ANP/GNP-BC   04/14/2014  10:48 AM  This was a shared visit with Ned Card. Henry Leonard has metastatic disease involving the chest. I suspect his pain may be related to chest wall disease. We will refer him for a restaging chest CT and a consult with Dr. Isidore Moos. We adjusted the pain regimen today.  Julieanne Manson, M.D.

## 2014-04-18 ENCOUNTER — Other Ambulatory Visit: Payer: Self-pay | Admitting: *Deleted

## 2014-04-18 ENCOUNTER — Telehealth: Payer: Self-pay | Admitting: *Deleted

## 2014-04-18 DIAGNOSIS — C029 Malignant neoplasm of tongue, unspecified: Secondary | ICD-10-CM

## 2014-04-18 DIAGNOSIS — C155 Malignant neoplasm of lower third of esophagus: Secondary | ICD-10-CM

## 2014-04-18 MED ORDER — HYDROCODONE-ACETAMINOPHEN 7.5-325 MG/15ML PO SOLN: 15.0000 mL | ORAL | Status: DC | PRN

## 2014-04-18 NOTE — Telephone Encounter (Signed)
Pt called asking "can I have a re-fill of my hydrocodone and could I alternate it with the morphine"  Pt states that his pain in his back is "pretty controlled" with the morphine and reports taking the morphine consistently every 3 hours.  Pt denies any other complaints; Note given to Dr. [name].

## 2014-04-18 NOTE — Telephone Encounter (Signed)
Per Dr. [name]; notified pt that MD OK refill of hydrocodone; can alternate with morphine; DO NOT take at the same time; call office if pain not controlled.  Pt verbalized understanding and states he will pick script up 04/19/14.

## 2014-04-20 NOTE — Progress Notes (Signed)
        Thoracic Location of Tumor / Histology:Pulmonary metastases and Adrenal metastasis - CT Chest  today  Patient presented with report of left sided back pain in the rib region that he grades as a level 6/10.  Unable to sit up, so lying down in recliner chair in clinic   Biopsies of  (if applicable) revealed: None Well Differentiatied Squamous Cell Carcinoma of the Esophagus invading into the adventitia with several hypermetabolic gastrohepatic lymph nodes, nearly obstructing mass   Tobacco/Marijuana/Snuff/ETOH use:"smoking off and on",  No alcohol and no illicit drug use  Past/Anticipated interventions by cardiothoracic surgery, if any: Unknown at this time  Past/Anticipated interventions by medical oncology, if any: Seen by Dr. [name] and Ned Card on 04/14/14  Signs/Symptoms  Weight changes, if any: Has lost 10 lbs since 02/16/14  Respiratory complaints, if any:  None  Hemoptysis, if any: None Pain issues, if any: right Back region SAFETY ISSUES:  Prior radiation?e11/5/14-12/23/14- Esophageal 50.4Gy  Pacemaker/ICD?No  Possible current pregnancy?No  Is the patient on methotrexate? NO  Current Complaints / other details:     Unable to sit up, so lying down in recliner chair in clinic due to pain.     Progressive right sided back pain.  CT today.

## 2014-04-21 ENCOUNTER — Ambulatory Visit
Admission: RE | Admit: 2014-04-21 | Discharge: 2014-04-21 | Disposition: A | Discharge status: Home / Self Care | Payer: Medicaid Other | Source: Ambulatory Visit | Attending: Radiation Oncology | Admitting: Radiation Oncology

## 2014-04-21 ENCOUNTER — Encounter: Payer: Self-pay | Admitting: Radiation Oncology

## 2014-04-21 ENCOUNTER — Ambulatory Visit (HOSPITAL_COMMUNITY)
Admission: RE | Admit: 2014-04-21 | Discharge: 2014-04-21 | Disposition: A | Discharge status: Home / Self Care | Payer: Medicaid Other | Source: Ambulatory Visit | Attending: Diagnostic Radiology | Admitting: Diagnostic Radiology

## 2014-04-21 VITALS — BP 93/71 | HR 107 | Temp 97.2°F

## 2014-04-21 DIAGNOSIS — C159 Malignant neoplasm of esophagus, unspecified: Secondary | ICD-10-CM | POA: Diagnosis present

## 2014-04-21 DIAGNOSIS — C7800 Secondary malignant neoplasm of unspecified lung: Secondary | ICD-10-CM | POA: Insufficient documentation

## 2014-04-21 DIAGNOSIS — Z923 Personal history of irradiation: Secondary | ICD-10-CM | POA: Insufficient documentation

## 2014-04-21 DIAGNOSIS — M5489 Other dorsalgia: Secondary | ICD-10-CM

## 2014-04-21 DIAGNOSIS — K2289 Other specified disease of esophagus: Secondary | ICD-10-CM | POA: Diagnosis not present

## 2014-04-21 DIAGNOSIS — C029 Malignant neoplasm of tongue, unspecified: Secondary | ICD-10-CM | POA: Diagnosis not present

## 2014-04-21 DIAGNOSIS — Z9221 Personal history of antineoplastic chemotherapy: Secondary | ICD-10-CM | POA: Insufficient documentation

## 2014-04-21 DIAGNOSIS — C7970 Secondary malignant neoplasm of unspecified adrenal gland: Secondary | ICD-10-CM | POA: Insufficient documentation

## 2014-04-21 DIAGNOSIS — R599 Enlarged lymph nodes, unspecified: Secondary | ICD-10-CM | POA: Diagnosis not present

## 2014-04-21 DIAGNOSIS — Z931 Gastrostomy status: Secondary | ICD-10-CM | POA: Insufficient documentation

## 2014-04-21 DIAGNOSIS — M549 Dorsalgia, unspecified: Secondary | ICD-10-CM | POA: Insufficient documentation

## 2014-04-21 DIAGNOSIS — C155 Malignant neoplasm of lower third of esophagus: Secondary | ICD-10-CM | POA: Diagnosis not present

## 2014-04-21 DIAGNOSIS — K228 Other specified diseases of esophagus: Secondary | ICD-10-CM | POA: Diagnosis not present

## 2014-04-21 DIAGNOSIS — Z87891 Personal history of nicotine dependence: Secondary | ICD-10-CM | POA: Insufficient documentation

## 2014-04-21 DIAGNOSIS — Z79899 Other long term (current) drug therapy: Secondary | ICD-10-CM | POA: Diagnosis not present

## 2014-04-21 DIAGNOSIS — C7801 Secondary malignant neoplasm of right lung: Secondary | ICD-10-CM

## 2014-04-21 DIAGNOSIS — Z85810 Personal history of malignant neoplasm of tongue: Secondary | ICD-10-CM | POA: Insufficient documentation

## 2014-04-21 DIAGNOSIS — Z880 Allergy status to penicillin: Secondary | ICD-10-CM | POA: Diagnosis not present

## 2014-04-21 DIAGNOSIS — C023 Malignant neoplasm of anterior two-thirds of tongue, part unspecified: Secondary | ICD-10-CM

## 2014-04-21 DIAGNOSIS — Z510 Encounter for antineoplastic radiation therapy: Secondary | ICD-10-CM | POA: Insufficient documentation

## 2014-04-21 DIAGNOSIS — Z8501 Personal history of malignant neoplasm of esophagus: Secondary | ICD-10-CM | POA: Insufficient documentation

## 2014-04-21 MED ORDER — HYDROCODONE-ACETAMINOPHEN 7.5-325 MG/15ML PO SOLN: 15.0000 mL | ORAL | Status: DC | PRN

## 2014-04-21 MED ORDER — MORPHINE SULFATE (CONCENTRATE) 20 MG/ML PO SOLN: 10.0000 mg | ORAL | Status: AC | PRN

## 2014-04-21 NOTE — Progress Notes (Signed)
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 Initially he was treated with a 4 field technique to a dose of 45 gray. The patient then received a 5.4 gray boost with a 4 field cone down technique. The patient's total dose was 50.4 gray. Daily image guidance was used for his treatment. RT given with carboplatin and Taxol; Completed 08/09/2013  For his tongue cancer, he was treated at [facility] with neoadjuvant erlotinib --> surgery --> ChRT with Cisplatin completed 8-20  HISTORY OF PRESENT ILLNESS::Henry Leonard is a 58 y.o. male who is well known by me, and unfortunately, since I saw him in June, has been found to have progressive lung metastases.  He reports progressive pain at the right lateral chest ((most severe) and right lower anterior rib cage (less severe) over the past few months. He is taking pain medication every 3-4 hours with partial improvement and need refills today. He is losing weight. He is with his sister today. His PEG tube in not secure. Sutures loose, but peg in place. Mr Marcial Pacas states he is instilling enteral nutrition without any pain, bloating, or difficulty. CT results reviewed with him today.  PREVIOUS RADIATION THERAPY: Yes as above  PAST MEDICAL HISTORY:  has a past medical history of Tobacco abuse; Leucocytosis (11/01/11); Dysphagia; Cancer of tongue (3/16/ 13); Seasonal allergies; S/P radiation therapy (02/24/12 -  04/06/12); Status post chemotherapy (02/24/12 -03/2012); Esophageal cancer (05/11/13); Edentulous; radiation therapy (06/22/13-08/09/13); and Treatment.    PAST SURGICAL HISTORY: Past Surgical History  Procedure Laterality Date  . Biopsy tongue  11/12/11    Tongue - Squamous Cell Cancer  . Glossectomy  01/19/12    with B/l neck dissection; radial forearm free flap  . Multiple tooth extractions  11/2011  . Feeding tube insertion and removal  11/2011 inserted removed 10/2012  . Tracheostomy  11/2011  . Tracheal closed stent removal w/ mlb  11/20/2102  . Gastrojejunostomy N/A 06/28/2013    Procedure: OPEN JEJUNOSTOMY TUBE PLACEMENT;  Surgeon: Gayland Curry, MD;  Location: [facility] ORS;  Service: General;  Laterality: N/A;    FAMILY HISTORY: family history includes Esophageal cancer in his father; Seizures in his mother.  SOCIAL HISTORY:  reports that he quit smoking about 9 months ago. His smoking use included Cigarettes. He has a 52.5 pack-year smoking history. He has never used smokeless tobacco. He reports that he drinks alcohol. He reports that he does not use illicit drugs.  ALLERGIES: Penicillins  MEDICATIONS:  Current Outpatient Prescriptions  Medication Sig Dispense Refill  . HYDROcodone-acetaminophen (HYCET) 7.5-325 mg/15 ml solution Take 15 mLs by mouth every 4 (four) hours as needed for moderate pain or severe pain.  270 mL  0  . morphine (ROXANOL) 20 MG/ML concentrated solution Take 0.5-1 mLs (10-20 mg total) by mouth every 3 (three) hours as needed for severe pain.  30 mL  0  . Naphazoline HCl (CLEAR EYES OP) Place 2 drops into both eyes daily as needed (For dry or  irritated eyes.).      Marland Kitchen nicotine (NICODERM CQ - DOSED IN MG/24 HOURS) 14 mg/24hr patch Place 1 patch (14 mg total) onto the skin daily.  28 patch  0  . Nutritional Supplements (OSMOLITE) LIQD Take 237 mLs by mouth See admin instructions. He drinks 3-4 cans per day.  10 Can  5  . ranitidine (ZANTAC) 150 MG/10ML syrup Take 10 mLs (150  mg total) by mouth 2 (two) times daily. To prevent acid reflux/ heartburn.  600 mL  2   No current facility-administered medications for this encounter.    REVIEW OF SYSTEMS:  Notable for that above.   PHYSICAL EXAM:    Vitals - 1 value per visit 12/18/9765  SYSTOLIC 93  DIASTOLIC 71  Pulse 341  Temperature 97.2[Axillary[  Respirations   Weight (lb)   Height   BMI   VISIT REPORT   Vitals with BMI   Height   Weight   BMI   Systolic   Diastolic   Pulse   Respirations    Curled up in chair/bed.  Thin.  Oropharynx - dry, post surgical/RT changes in tongue. Heart reg rhythm, tachy rate.  Lungs - decreased sounds bilaterally. Has pain in upper lateral right chest and lower anterior right rib cage. PEG tube loose, no obvious sign of infection in ostomy.  ECOG = 3  0 - Asymptomatic (Fully active, able to carry on all predisease activities without restriction)  1 - Symptomatic but completely ambulatory (Restricted in physically strenuous activity but ambulatory and able to carry out work of a light or sedentary nature. For example, light housework, office work)  2 - Symptomatic, <50% in bed during the day (Ambulatory and capable of all self care but unable to carry out any work activities. Up and about more than 50% of waking hours)  3 - Symptomatic, >50% in bed, but not bedbound (Capable of only limited self-care, confined to bed or chair 50% or more of waking hours)  4 - Bedbound (Completely disabled. Cannot carry on any self-care. Totally confined to bed or chair)  5 - Death   Eustace Pen MM, Creech RH, Tormey DC, et al. [phone]). "Toxicity and response criteria of the [facility]". [place] Oncol. 5 (6): 649-55   LABORATORY DATA:  Lab Results  Component Value Date   WBC 13.0* 04/02/2014   HGB 9.6* 04/02/2014   HCT 29.5* 04/02/2014   MCV 85.8 04/02/2014   PLT 637* 04/02/2014   CMP     Component Value Date/Time   NA 135* 04/02/2014 1811   NA 134*  02/14/2014 1119   K 4.2 04/02/2014 1811   K 4.3 02/14/2014 1119   CL 96 04/02/2014 1811   CO2 26 04/02/2014 1811   CO2 25 02/14/2014 1119   GLUCOSE 116* 04/02/2014 1811   GLUCOSE 109 02/14/2014 1119   BUN 20 04/02/2014 1811   BUN 17.4 02/14/2014 1119   CREATININE 0.32* 04/02/2014 1811   CREATININE 0.6* 02/14/2014 1119   CALCIUM 9.8 04/02/2014 1811   CALCIUM 10.0 02/14/2014 1119   PROT 7.4 04/02/2014 1811   PROT 7.4 02/14/2014 1119   ALBUMIN 3.0* 04/02/2014 1811   ALBUMIN 3.6 02/14/2014 1119   AST 15 04/02/2014 1811   AST 22 02/14/2014 1119   ALT 9 04/02/2014 1811   ALT 20 02/14/2014 1119   ALKPHOS 126* 04/02/2014 1811   ALKPHOS 88 02/14/2014 1119   BILITOT 0.5 04/02/2014 1811   BILITOT 0.31 02/14/2014 1119  GFRNONAA >90 04/02/2014 1811   GFRAA >90 04/02/2014 1811         RADIOGRAPHY: Dg Chest Port 1 View  04/02/2014   CLINICAL DATA:  Chest pain and mid back pain. History of esophageal cancer and tongue cancer.  EXAM: PORTABLE CHEST - 1 VIEW  COMPARISON:  CT scan of the chest dated 02/14/2014  FINDINGS: Power port in place.  The tip appears in the right atrium.  There are multiple poorly defined metastatic lesions in the right lung. The most prominent one is in the right apex laterally, essentially unchanged since the prior study. The patchy densities in the right mid and lower lung zone appear slightly more prominent except for the one at the right base posteriorly which is unchanged.  There is slight scarring in the left upper lobe, stable.  No acute osseous abnormality.  IMPRESSION: Progressive metastatic disease with new lesions in the right lung.   Electronically Signed   By: Rozetta Nunnery M.D.   On: 04/02/2014 19:51   04-21-14 EXAM:  CT CHEST WITHOUT CONTRAST  TECHNIQUE:  Multidetector CT imaging of the chest was performed following the  standard protocol without IV contrast.  COMPARISON: 02/14/2014.  FINDINGS:  Right IJ Port-A-Cath terminates in the low SVC or SVC RA junction.  Mediastinal lymph  nodes measure up to 11 mm in the precarinal  station, stable. Right hilar soft tissue fullness is new. Discrete  lymph nodes are difficult to visualize without IV contrast. No  axillary adenopathy. Atherosclerotic calcification of the arterial  vasculature, including extensive three-vessel involvement of the  coronary arteries. Heart size normal. There may be a small  pericardial effusion. Thickening of the distal esophageal wall, as  before.  Trace right pleural fluid or thickening. A subpleural mass in the  apical segment right upper lobe measures 2.9 x 5.9 cm (previously  1.9 x 2.2 cm). Additional nodules and masses are seen in the lungs  bilaterally, also progressive. There is debris in the trachea.  Incidental imaging of the upper abdomen shows the visualized  portions of the liver, gallbladder and right adrenal gland to be  unremarkable. A new left adrenal mass measures 3.3 x 3.4 cm.  Probable renal vascular calcifications in the visualized portions of  the kidneys bilaterally. Visualized portions of the spleen,  pancreas, stomach and bowel are grossly unremarkable. No worrisome  lytic or sclerotic lesions.  IMPRESSION:  1. Progressive pulmonary metastatic disease with right hilar  adenopathy.  2. New left adrenal metastasis.  3. Thickening of the distal esophagus, as before.  4. Trace right pleural fluid or thickening.  5. Question small pericardial effusion.      IMPRESSION/PLAN: 3 58 yo man with metastatic disease, declining ECOG PS.  His pain is most likely secondary to the subpleural mass in the RUL.  The right lower anterior rib cage pain is harder to determine etiology - possible related to the subpleural mass adjacent to the right sternum. I will treat this, in addition to the RUL mass, as long as his prior RT dosage/fields are not prohibitive in terms of delivering such palliative RT safely.  Anticipate simulation next week.   We discussed the risks, benefits,  and side effects of palliative radiotherapy. Most likely side effects - skin irritation, fatigue.  No guarantees of treatment were given. A consent form was signed and placed in the patient's medical record. The patient is enthusiastic about proceeding with treatment. I look forward to participating in his care.   Will  refer to IR for PEG tube issues. __________________________________________   Eppie Gibson, MD

## 2014-04-21 NOTE — Progress Notes (Addendum)
Prior to disposition home,his PEG tube site was cleansed with sterile NS and 2 x2 gauge applied as requested by Mr. Henry Leonard.  Noted yellowish drainage at the isertion site. Sutures loose, but peg in place.  Mr Henry Leonard states he is instilling enteral nutrition without any pain, bloating, or difficulty.Accompanied by his sister who lives in [place]. Will reassess on his next visit.

## 2014-04-25 ENCOUNTER — Encounter: Payer: Self-pay | Admitting: Radiation Oncology

## 2014-04-25 DIAGNOSIS — C7800 Secondary malignant neoplasm of unspecified lung: Secondary | ICD-10-CM | POA: Insufficient documentation

## 2014-04-25 NOTE — Progress Notes (Signed)
  Radiation Oncology         [phone] ________________________________  Name: Henry Leonard MRN: [id number]  Date: 04/26/2014  DOB: 1956-02-14  SIMULATION AND TREATMENT PLANNING NOTE  Outpatient  DIAGNOSIS:     ICD-9-CM ICD-10-CM  1. Secondary malignant neoplasm of right lung 197.0 C78.01     NARRATIVE:  The patient was brought to the [facility].  Identity was confirmed.  All relevant records and images related to the planned course of therapy were reviewed.  The patient freely provided informed written consent to proceed with treatment after reviewing the details related to the planned course of therapy. The consent form was witnessed and verified by the simulation staff.    Then, the patient was set-up in a stable reproducible  supine position for radiation therapy.  CT images were obtained.  Surface markings were placed.  The CT images were loaded into the planning software.    TREATMENT PLANNING NOTE: Treatment planning then occurred.  The radiation prescription was entered and confirmed.    A total of 5 medically necessary complex treatment devices were fabricated and supervised by me - 5 fields with MLCs to block lung, heart, esophagus. I have requested : 3D Simulation  I have requested a DVH of the following structures: lung, heart, esophagus, CTVs     The patient will receive 20 Gy in 5 fractions to his right upper lobe subpleural mass (AP PA and right lateral beams) and right parasternal mass (wedged pair).  Special Treatment Procedure Note: The patient received prior radiotherapy close to his current fields. There will be some overlap of radiation dose.  Prior regional radiotherapy increases the risk of side effects from treatment. I have considered this in the treatment planning process and have aimed to minimize tissue overlap.  This increases the complexity of this patient's treatment and therefore this constitutes a special treatment  procedure.   -----------------------------------  Eppie Gibson, MD

## 2014-04-26 ENCOUNTER — Ambulatory Visit
Admission: RE | Admit: 2014-04-26 | Discharge: 2014-04-26 | Disposition: A | Discharge status: Home / Self Care | Payer: Medicaid Other | Source: Ambulatory Visit | Attending: Radiation Oncology | Admitting: Radiation Oncology

## 2014-04-26 DIAGNOSIS — C7800 Secondary malignant neoplasm of unspecified lung: Secondary | ICD-10-CM | POA: Diagnosis not present

## 2014-04-26 DIAGNOSIS — Z931 Gastrostomy status: Secondary | ICD-10-CM | POA: Diagnosis not present

## 2014-04-26 DIAGNOSIS — Z923 Personal history of irradiation: Secondary | ICD-10-CM | POA: Diagnosis not present

## 2014-04-26 DIAGNOSIS — Z8501 Personal history of malignant neoplasm of esophagus: Secondary | ICD-10-CM | POA: Diagnosis not present

## 2014-04-26 DIAGNOSIS — Z510 Encounter for antineoplastic radiation therapy: Secondary | ICD-10-CM | POA: Diagnosis present

## 2014-04-26 DIAGNOSIS — Z9221 Personal history of antineoplastic chemotherapy: Secondary | ICD-10-CM | POA: Diagnosis not present

## 2014-04-26 DIAGNOSIS — Z85810 Personal history of malignant neoplasm of tongue: Secondary | ICD-10-CM | POA: Diagnosis not present

## 2014-04-26 DIAGNOSIS — Z87891 Personal history of nicotine dependence: Secondary | ICD-10-CM | POA: Diagnosis not present

## 2014-04-26 DIAGNOSIS — C7801 Secondary malignant neoplasm of right lung: Secondary | ICD-10-CM

## 2014-04-27 ENCOUNTER — Telehealth: Payer: Self-pay | Admitting: *Deleted

## 2014-04-27 NOTE — Telephone Encounter (Signed)
Call from pt's sister with a "heads up" that she will be requesting hospice referral on behalf of the family. She reports pt is not doing well. She will accompany him to visit 9/11.

## 2014-04-28 ENCOUNTER — Telehealth: Payer: Self-pay | Admitting: *Deleted

## 2014-04-28 ENCOUNTER — Ambulatory Visit: Payer: Self-pay | Admitting: Nurse Practitioner

## 2014-04-28 ENCOUNTER — Other Ambulatory Visit: Payer: Self-pay | Admitting: *Deleted

## 2014-04-28 DIAGNOSIS — C155 Malignant neoplasm of lower third of esophagus: Secondary | ICD-10-CM

## 2014-04-28 DIAGNOSIS — C7801 Secondary malignant neoplasm of right lung: Secondary | ICD-10-CM

## 2014-04-28 NOTE — Telephone Encounter (Signed)
Order received for referral to [facility]. Called referral to [facility] in admissions. They will contact pt today.

## 2014-04-28 NOTE — Telephone Encounter (Signed)
Message from pt's sister reporting pt does not feel well enough to come in for office visit. Requests hospice referral. Spoke with pt, he will not come in to office, he asks that we speak to his sister instead. Received call from Gwenlyn Fudge with hospice- they have spoken to family. They have availability to see pt this afternoon or over the weekend if referral is received.

## 2014-05-01 ENCOUNTER — Telehealth: Payer: Self-pay | Admitting: *Deleted

## 2014-05-01 ENCOUNTER — Other Ambulatory Visit: Payer: Self-pay | Admitting: *Deleted

## 2014-05-01 DIAGNOSIS — M5489 Other dorsalgia: Secondary | ICD-10-CM

## 2014-05-01 DIAGNOSIS — C029 Malignant neoplasm of tongue, unspecified: Secondary | ICD-10-CM

## 2014-05-01 DIAGNOSIS — C023 Malignant neoplasm of anterior two-thirds of tongue, part unspecified: Secondary | ICD-10-CM

## 2014-05-01 DIAGNOSIS — Z510 Encounter for antineoplastic radiation therapy: Secondary | ICD-10-CM | POA: Diagnosis not present

## 2014-05-01 DIAGNOSIS — C155 Malignant neoplasm of lower third of esophagus: Secondary | ICD-10-CM

## 2014-05-01 MED ORDER — POLYETHYLENE GLYCOL 3350 17 GM/SCOOP PO POWD: 1.0000 | Freq: Once | ORAL | Status: AC

## 2014-05-01 MED ORDER — HYDROCODONE-ACETAMINOPHEN 7.5-325 MG/15ML PO SOLN
15.0000 mL | ORAL | Status: DC | PRN
Start: 2014-05-01 — End: 2014-05-08

## 2014-05-01 MED ORDER — DISPOSABLE ENEMA 19-7 GM/118ML RE ENEM: 1.0000 | ENEMA | Freq: Once | RECTAL | Status: AC

## 2014-05-01 NOTE — Addendum Note (Signed)
Addended by: Domenic Schwab on: 05/01/2014 02:55 PM   Modules accepted: Orders

## 2014-05-01 NOTE — Telephone Encounter (Signed)
Notified [place] that MD re-filled Hycet and it has been faxed to [facility].  Also that MD ordered Fleets enema prn and miralax 17 grams daily.  [name] verbalized understanding of information.

## 2014-05-01 NOTE — Telephone Encounter (Signed)
Henry Leonard reports "pt needs refill of Hycet; also pt has not had BM in 4 days; refuses to be checked for impaction" and RN requesting laxative order.  Note to Dr. [name].

## 2014-05-02 ENCOUNTER — Ambulatory Visit: Admission: RE | Admit: 2014-05-02 | Payer: Medicaid Other | Source: Ambulatory Visit | Admitting: Radiation Oncology

## 2014-05-03 ENCOUNTER — Ambulatory Visit: Payer: Medicaid Other

## 2014-05-03 ENCOUNTER — Other Ambulatory Visit (HOSPITAL_COMMUNITY): Payer: Self-pay

## 2014-05-03 DIAGNOSIS — Z510 Encounter for antineoplastic radiation therapy: Secondary | ICD-10-CM | POA: Diagnosis not present

## 2014-05-04 ENCOUNTER — Telehealth: Payer: Self-pay | Admitting: *Deleted

## 2014-05-04 ENCOUNTER — Ambulatory Visit: Payer: Medicaid Other

## 2014-05-04 NOTE — Telephone Encounter (Signed)
Received message from [place], RN for [facility]; pt continues to be constipated and refuses to take enema; refuses Miralax; denies any abdominal discomfort at this time.  Pt has not had BM since 04/27/14; please make MD aware."  Note to Dr. [name].

## 2014-05-05 ENCOUNTER — Ambulatory Visit: Payer: Medicaid Other

## 2014-05-05 ENCOUNTER — Ambulatory Visit: Admission: RE | Admit: 2014-05-05 | Payer: Medicaid Other | Source: Ambulatory Visit

## 2014-05-05 ENCOUNTER — Ambulatory Visit (HOSPITAL_COMMUNITY): Payer: Medicaid Other

## 2014-05-08 ENCOUNTER — Ambulatory Visit: Payer: Medicaid Other

## 2014-05-08 ENCOUNTER — Encounter: Payer: Self-pay | Admitting: Radiation Oncology

## 2014-05-08 ENCOUNTER — Other Ambulatory Visit: Payer: Self-pay | Admitting: *Deleted

## 2014-05-08 DIAGNOSIS — M5489 Other dorsalgia: Secondary | ICD-10-CM

## 2014-05-08 DIAGNOSIS — C023 Malignant neoplasm of anterior two-thirds of tongue, part unspecified: Secondary | ICD-10-CM

## 2014-05-08 DIAGNOSIS — C155 Malignant neoplasm of lower third of esophagus: Secondary | ICD-10-CM

## 2014-05-08 DIAGNOSIS — C029 Malignant neoplasm of tongue, unspecified: Secondary | ICD-10-CM

## 2014-05-08 MED ORDER — HYDROCODONE-ACETAMINOPHEN 7.5-325 MG/15ML PO SOLN: 15.0000 mL | ORAL | Status: DC | PRN

## 2014-05-08 NOTE — Telephone Encounter (Signed)
Notified [name], [place] that per request; re-fill for Hycet has been faxed to pharmacy ([facility]).  [name] verbalized understanding of information.

## 2014-05-09 ENCOUNTER — Ambulatory Visit: Payer: Medicaid Other

## 2014-05-09 ENCOUNTER — Ambulatory Visit: Payer: Medicaid Other | Admitting: Radiation Oncology

## 2014-05-10 ENCOUNTER — Ambulatory Visit: Payer: Medicaid Other

## 2014-05-11 ENCOUNTER — Ambulatory Visit: Payer: Medicaid Other

## 2014-05-12 ENCOUNTER — Telehealth: Payer: Self-pay | Admitting: Oncology

## 2014-05-12 ENCOUNTER — Ambulatory Visit: Payer: Medicaid Other

## 2014-05-12 ENCOUNTER — Telehealth: Payer: Self-pay | Admitting: *Deleted

## 2014-05-12 NOTE — Telephone Encounter (Signed)
Sister cancelled his appointments. Currently being followed by [place]. Does he need to try to come in and see Dr. [name] ?

## 2014-05-12 NOTE — Telephone Encounter (Signed)
Pt's sister lft msg to cancel 09/29 MD visit and she would c/b to r/s

## 2014-05-15 ENCOUNTER — Ambulatory Visit: Payer: Medicaid Other

## 2014-05-15 ENCOUNTER — Telehealth: Payer: Self-pay | Admitting: *Deleted

## 2014-05-15 ENCOUNTER — Ambulatory Visit (HOSPITAL_COMMUNITY): Payer: Medicaid Other

## 2014-05-15 DIAGNOSIS — C029 Malignant neoplasm of tongue, unspecified: Secondary | ICD-10-CM

## 2014-05-15 DIAGNOSIS — C155 Malignant neoplasm of lower third of esophagus: Secondary | ICD-10-CM

## 2014-05-15 DIAGNOSIS — M5489 Other dorsalgia: Secondary | ICD-10-CM

## 2014-05-15 DIAGNOSIS — C023 Malignant neoplasm of anterior two-thirds of tongue, part unspecified: Secondary | ICD-10-CM

## 2014-05-15 MED ORDER — SORBITOL 70 % PO SOLN: 30.0000 mL | Freq: Every day | ORAL | Status: AC | PRN

## 2014-05-15 MED ORDER — HYDROCODONE-ACETAMINOPHEN 7.5-325 MG/15ML PO SOLN: 15.0000 mL | ORAL | Status: DC | PRN

## 2014-05-15 NOTE — Telephone Encounter (Signed)
Message from [place], hospice nurse requesting Hydrocodone refill for pt. Also reports last BM was 9/1- pt declined enema and to be checked for impaction. Hospice nurse ordered bedside commode to see if this would help. She also reports "poor integrity" of his feeding tube and his oral intake appears to be minimal. Pt declined to have feeding tube evaluated.  Per [place], pt has recently agreed to aid services. Some dried blood around feeding tube site. Stitches seem to be coming loose. Has not allowed nurse to administer Miralax. She will contact pt to review Sorbitol instructions.

## 2014-05-15 NOTE — Telephone Encounter (Signed)
Per Dr. [name]: It is up to the pt, if he'd like to follow up in office we will schedule visit. As long as pt is being seen by hospice may call PRN. Left message on voicemail for Kim with this info.

## 2014-05-16 ENCOUNTER — Ambulatory Visit: Payer: Medicaid Other

## 2014-05-16 ENCOUNTER — Ambulatory Visit: Payer: Self-pay | Admitting: Oncology

## 2014-05-17 ENCOUNTER — Ambulatory Visit: Payer: Medicaid Other

## 2014-05-18 ENCOUNTER — Ambulatory Visit: Payer: Medicaid Other

## 2014-05-19 ENCOUNTER — Ambulatory Visit: Payer: Medicaid Other

## 2014-05-22 ENCOUNTER — Ambulatory Visit: Payer: Medicaid Other

## 2014-05-22 ENCOUNTER — Other Ambulatory Visit: Payer: Self-pay | Admitting: *Deleted

## 2014-05-22 DIAGNOSIS — M5489 Other dorsalgia: Secondary | ICD-10-CM

## 2014-05-22 DIAGNOSIS — C029 Malignant neoplasm of tongue, unspecified: Secondary | ICD-10-CM

## 2014-05-22 DIAGNOSIS — C155 Malignant neoplasm of lower third of esophagus: Secondary | ICD-10-CM

## 2014-05-22 MED ORDER — HYDROCODONE-ACETAMINOPHEN 7.5-325 MG/15ML PO SOLN: 15.0000 mL | ORAL | Status: AC | PRN

## 2014-05-22 NOTE — Telephone Encounter (Signed)
Message from hospice RN requesting refill of hydrocodone. [facility]

## 2014-05-23 ENCOUNTER — Ambulatory Visit: Payer: Medicaid Other

## 2014-05-29 ENCOUNTER — Telehealth: Payer: Self-pay | Admitting: *Deleted

## 2014-05-29 ENCOUNTER — Other Ambulatory Visit: Payer: Self-pay | Admitting: *Deleted

## 2014-05-29 DIAGNOSIS — C155 Malignant neoplasm of lower third of esophagus: Secondary | ICD-10-CM

## 2014-05-29 DIAGNOSIS — M5489 Other dorsalgia: Secondary | ICD-10-CM

## 2014-05-29 DIAGNOSIS — C029 Malignant neoplasm of tongue, unspecified: Secondary | ICD-10-CM

## 2014-05-29 MED ORDER — FENTANYL 12 MCG/HR TD PT72: 12.5000 ug | MEDICATED_PATCH | TRANSDERMAL | Status: AC

## 2014-05-30 ENCOUNTER — Telehealth: Payer: Self-pay | Admitting: *Deleted

## 2014-05-30 NOTE — Telephone Encounter (Signed)
Received message from [place], [place] from [facility] that pt expired 2014-06-13 @ 10:56pm.  MD made aware.

## 2014-06-01 ENCOUNTER — Encounter: Payer: Self-pay | Admitting: Internal Medicine

## 2014-06-07 NOTE — Progress Notes (Signed)
Radiotherapy as planned for Henry Leonard's chest was not delivered.  Henry Leonard declined treatment due to worsening clinical status.  -----------------------------------  Henry Gibson, MD

## 2014-06-09 ENCOUNTER — Ambulatory Visit: Payer: Medicaid Other | Admitting: Radiation Oncology

## 2014-06-18 NOTE — Telephone Encounter (Signed)
[name], RN with [facility] reports; pt in a lot of pain and keeps spilling his pain med.  Request Fentanyl patch to be ordered.  Pt refuses any help; Henry Leonard reports that pt will not allow her to take BP, states it hurts too much.  Also nurse aid comes in and pt refuses everything; now pees in cup; has not had bath in many days; pt by himself most of day with neighbor checking in on him during the day.  Family comes in from out of state but unable to be with him continually.  [name] has talked to pt re: placement at [facility] place and pt has refused at this time.  Discussed situation with Dr. [name]; notified [name] that MD will prescribe Fentanyl patch.  Henry Leonard verbalized understanding.

## 2014-06-18 DEATH — deceased

## 2015-03-16 IMAGING — US IR FLUORO GUIDE CV LINE*L*
1 series · 1 of 1 positions shown · non-contrast
Comparison: None.

INDICATION: History esophageal carcinoma, in need of intravenous access for
chemotherapy administration

[Series 1: sp fluoro guide cv line*left* · 1 of 1 slices shown]
[im 1/1]
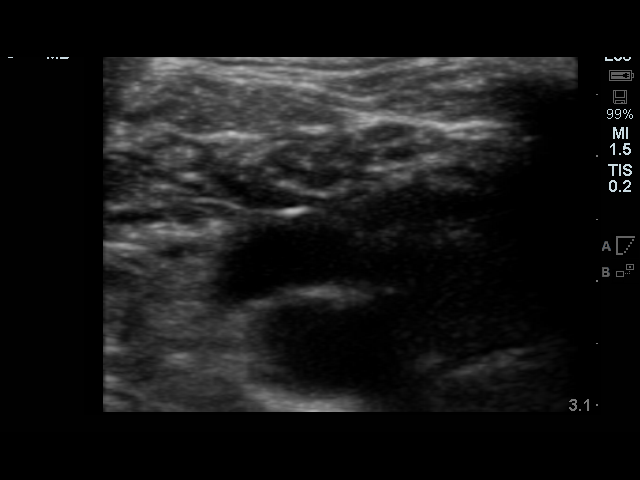

[1 of 1 positions shown; findings below may reference images not displayed]

EXAM:
IMPLANTED PORT A CATH PLACEMENT WITH ULTRASOUND AND FLUOROSCOPIC
GUIDANCE

MEDICATIONS:
Ancef 2 gm IV; IV antibiotic was given in an appropriate time
interval prior to skin puncture.

ANESTHESIA/SEDATION:
Versed 2 mg IV; Fentanyl 100 mcg IV;

Total Moderate Sedation Time

35  minutes.

CONTRAST:  None
FLUOROSCOPY TIME:  36 seconds.

PROCEDURE:
The procedure, risks, benefits, and alternatives were explained to
the patient. Questions regarding the procedure were encouraged and
answered. The patient understands and consents to the procedure.

The right neck and chest were prepped with chlorhexidine in a
sterile fashion, and a sterile drape was applied covering the
operative field. Maximum barrier sterile technique with sterile
gowns and gloves were used for the procedure. A timeout was
performed prior to the initiation of the procedure. Local anesthesia
was provided with 1% lidocaine with epinephrine.

After creating a small venotomy incision, a micropuncture kit was
utilized to access the internal jugular vein under direct, real-time
ultrasound guidance. Ultrasound image documentation was performed.
The microwire was kinked to measure appropriate catheter length.

A subcutaneous port pocket was then created along the upper chest
wall utilizing a combination of sharp and blunt dissection. The
pocket was irrigated with sterile saline. A single lumen ISP power
injectable port was chosen for placement. The 8 Fr catheter was
tunneled from the port pocket site to the venotomy incision. The
port was placed in the pocket. The external catheter was trimmed to
appropriate length. At the venotomy, an 8 Fr peel-away sheath was
placed over a guidewire under fluoroscopic guidance. The catheter
was then placed through the sheath and the sheath was removed. Final
catheter positioning was confirmed and documented with a
fluoroscopic spot radiograph. The port was accessed with Martin Jan Bortkiewicz
needle, aspirated and flushed with heparinized saline.

The venotomy site was closed with an interrupted 4-0 Vicryl suture.
The port pocket incision was closed with interrupted 2-0 Vicryl
suture and the skin was opposed with a running subcuticular 4-0
Vicryl suture. Dermabond and Nicko were applied to both
incisions. Dressings were placed. The patient tolerated the
procedure well without immediate post procedural complication.

COMPLICATIONS:
None immediate
FINDINGS: After catheter placement, the tip lies within the superior
cavoatrial junction. The catheter aspirates and flushes normally and
is ready for immediate use.
IMPRESSION: Successful placement of a right internal jugular approach power
injectable Port-A-Cath. The catheter is ready for immediate use.

## 2015-12-26 IMAGING — DX DG CHEST 1V PORT
1 series · 1 of 1 positions shown · non-contrast
Comparison: CT scan of the chest dated 02/14/2014

CLINICAL DATA: Chest pain and mid back pain. History of esophageal
cancer and tongue cancer.

EXAM:
PORTABLE CHEST - 1 VIEW

[chest ap]
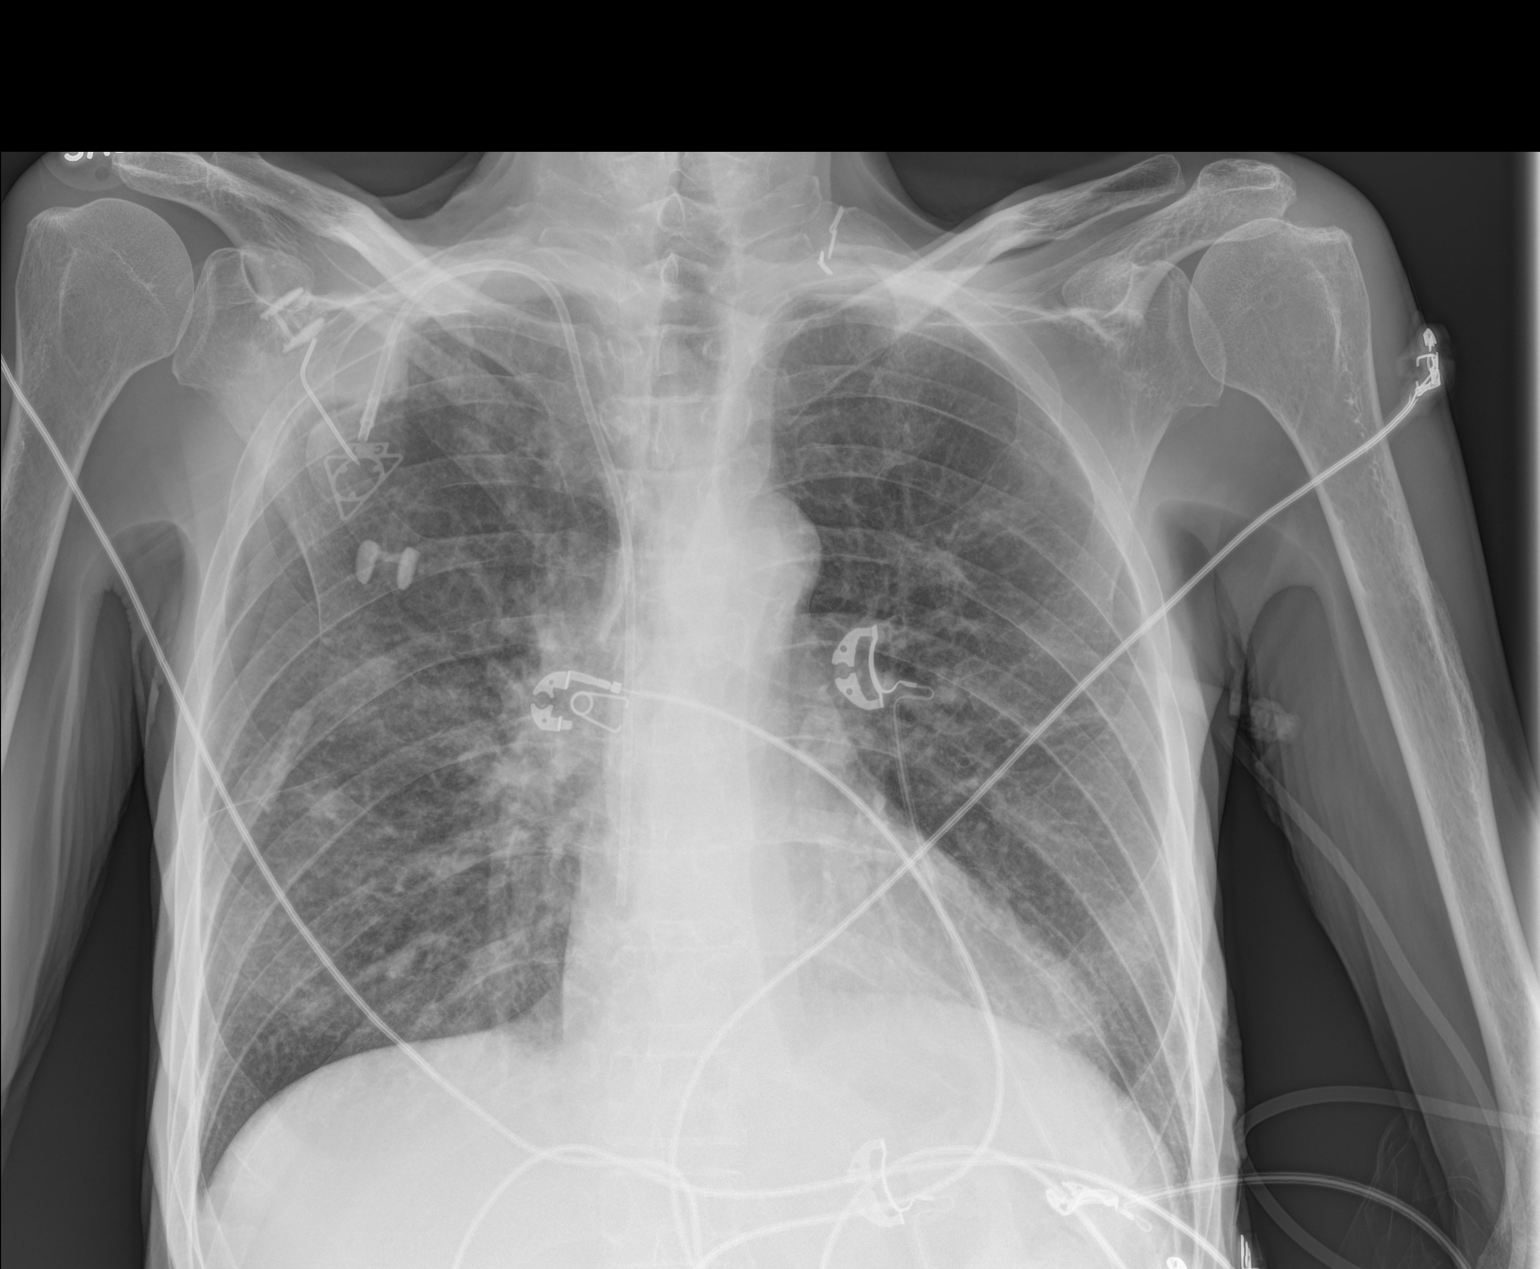

[1 of 1 positions shown; findings below may reference images not displayed]

FINDINGS: Power port in place.  The tip appears in the right atrium.

There are multiple poorly defined metastatic lesions in the right
lung. The most prominent one is in the right apex laterally,
essentially unchanged since the prior study. The patchy densities in
the right mid and lower lung zone appear slightly more prominent
except for the one at the right base posteriorly which is unchanged.

There is slight scarring in the left upper lobe, stable.

No acute osseous abnormality.
IMPRESSION: Progressive metastatic disease with new lesions in the right lung.

## 2016-02-01 ENCOUNTER — Other Ambulatory Visit: Payer: Self-pay | Admitting: Nurse Practitioner
